# Patient Record
Sex: Male | Born: 1955 | Hispanic: Yes | Marital: Single | Smoking: Never smoker
Health system: Southern US, Community
[De-identification: ages and names within clinical notes are randomized; demographics above are authoritative.]

## PROBLEM LIST (undated history)

## (undated) DIAGNOSIS — G4733 Obstructive sleep apnea (adult) (pediatric): Secondary | ICD-10-CM

## (undated) DIAGNOSIS — I2699 Other pulmonary embolism without acute cor pulmonale: Secondary | ICD-10-CM

## (undated) DIAGNOSIS — I5042 Chronic combined systolic (congestive) and diastolic (congestive) heart failure: Secondary | ICD-10-CM

## (undated) DIAGNOSIS — Z87448 Personal history of other diseases of urinary system: Secondary | ICD-10-CM

## (undated) DIAGNOSIS — I10 Essential (primary) hypertension: Secondary | ICD-10-CM

## (undated) DIAGNOSIS — I428 Other cardiomyopathies: Secondary | ICD-10-CM

## (undated) HISTORY — DX: Obstructive sleep apnea (adult) (pediatric): G47.33

## (undated) HISTORY — DX: Chronic combined systolic (congestive) and diastolic (congestive) heart failure: I50.42

## (undated) HISTORY — DX: Other pulmonary embolism without acute cor pulmonale: I26.99

## (undated) HISTORY — DX: Personal history of other diseases of urinary system: Z87.448

---

## 2016-06-10 ENCOUNTER — Encounter (HOSPITAL_COMMUNITY): Payer: Self-pay | Admitting: Emergency Medicine

## 2016-06-10 ENCOUNTER — Emergency Department (HOSPITAL_COMMUNITY): Payer: Self-pay

## 2016-06-10 ENCOUNTER — Encounter (HOSPITAL_COMMUNITY)
Admission: EM | Disposition: A | Discharge status: Home Health | Payer: Self-pay | Source: Home / Self Care | Attending: Internal Medicine

## 2016-06-10 ENCOUNTER — Inpatient Hospital Stay (HOSPITAL_COMMUNITY)
Admission: EM | Admit: 2016-06-10 | Discharge: 2016-06-22 | DRG: 602 | Disposition: A | Discharge status: Home Health | Payer: Self-pay | Attending: Internal Medicine | Admitting: Internal Medicine

## 2016-06-10 ENCOUNTER — Inpatient Hospital Stay (HOSPITAL_COMMUNITY): Payer: Self-pay | Admitting: Certified Registered"

## 2016-06-10 ENCOUNTER — Inpatient Hospital Stay (HOSPITAL_COMMUNITY): Payer: MEDICAID | Admitting: Certified Registered"

## 2016-06-10 DIAGNOSIS — I2699 Other pulmonary embolism without acute cor pulmonale: Secondary | ICD-10-CM

## 2016-06-10 DIAGNOSIS — E876 Hypokalemia: Secondary | ICD-10-CM | POA: Diagnosis present

## 2016-06-10 DIAGNOSIS — I2609 Other pulmonary embolism with acute cor pulmonale: Secondary | ICD-10-CM | POA: Diagnosis not present

## 2016-06-10 DIAGNOSIS — B9620 Unspecified Escherichia coli [E. coli] as the cause of diseases classified elsewhere: Secondary | ICD-10-CM | POA: Diagnosis present

## 2016-06-10 DIAGNOSIS — E662 Morbid (severe) obesity with alveolar hypoventilation: Secondary | ICD-10-CM | POA: Insufficient documentation

## 2016-06-10 DIAGNOSIS — K219 Gastro-esophageal reflux disease without esophagitis: Secondary | ICD-10-CM | POA: Diagnosis present

## 2016-06-10 DIAGNOSIS — I429 Cardiomyopathy, unspecified: Secondary | ICD-10-CM | POA: Diagnosis present

## 2016-06-10 DIAGNOSIS — R159 Full incontinence of feces: Secondary | ICD-10-CM | POA: Diagnosis present

## 2016-06-10 DIAGNOSIS — K921 Melena: Secondary | ICD-10-CM | POA: Diagnosis present

## 2016-06-10 DIAGNOSIS — Z79899 Other long term (current) drug therapy: Secondary | ICD-10-CM

## 2016-06-10 DIAGNOSIS — N289 Disorder of kidney and ureter, unspecified: Secondary | ICD-10-CM

## 2016-06-10 DIAGNOSIS — N179 Acute kidney failure, unspecified: Secondary | ICD-10-CM | POA: Diagnosis present

## 2016-06-10 DIAGNOSIS — I5042 Chronic combined systolic (congestive) and diastolic (congestive) heart failure: Secondary | ICD-10-CM | POA: Insufficient documentation

## 2016-06-10 DIAGNOSIS — R7303 Prediabetes: Secondary | ICD-10-CM | POA: Diagnosis present

## 2016-06-10 DIAGNOSIS — I959 Hypotension, unspecified: Secondary | ICD-10-CM | POA: Diagnosis present

## 2016-06-10 DIAGNOSIS — I5043 Acute on chronic combined systolic (congestive) and diastolic (congestive) heart failure: Secondary | ICD-10-CM | POA: Diagnosis present

## 2016-06-10 DIAGNOSIS — I110 Hypertensive heart disease with heart failure: Secondary | ICD-10-CM | POA: Diagnosis present

## 2016-06-10 DIAGNOSIS — L0231 Cutaneous abscess of buttock: Principal | ICD-10-CM | POA: Diagnosis present

## 2016-06-10 DIAGNOSIS — J9601 Acute respiratory failure with hypoxia: Secondary | ICD-10-CM | POA: Diagnosis not present

## 2016-06-10 DIAGNOSIS — I5020 Unspecified systolic (congestive) heart failure: Secondary | ICD-10-CM | POA: Diagnosis present

## 2016-06-10 DIAGNOSIS — E860 Dehydration: Secondary | ICD-10-CM | POA: Diagnosis present

## 2016-06-10 DIAGNOSIS — L0291 Cutaneous abscess, unspecified: Secondary | ICD-10-CM

## 2016-06-10 DIAGNOSIS — R0902 Hypoxemia: Secondary | ICD-10-CM

## 2016-06-10 DIAGNOSIS — D649 Anemia, unspecified: Secondary | ICD-10-CM | POA: Diagnosis present

## 2016-06-10 DIAGNOSIS — E872 Acidosis: Secondary | ICD-10-CM | POA: Diagnosis present

## 2016-06-10 DIAGNOSIS — I428 Other cardiomyopathies: Secondary | ICD-10-CM

## 2016-06-10 DIAGNOSIS — I4729 Other ventricular tachycardia: Secondary | ICD-10-CM | POA: Diagnosis present

## 2016-06-10 DIAGNOSIS — I472 Ventricular tachycardia: Secondary | ICD-10-CM | POA: Diagnosis not present

## 2016-06-10 DIAGNOSIS — Z6841 Body Mass Index (BMI) 40.0 and over, adult: Secondary | ICD-10-CM

## 2016-06-10 DIAGNOSIS — I10 Essential (primary) hypertension: Secondary | ICD-10-CM | POA: Diagnosis present

## 2016-06-10 DIAGNOSIS — A419 Sepsis, unspecified organism: Secondary | ICD-10-CM | POA: Diagnosis present

## 2016-06-10 DIAGNOSIS — I9589 Other hypotension: Secondary | ICD-10-CM

## 2016-06-10 HISTORY — DX: Morbid (severe) obesity due to excess calories: E66.01

## 2016-06-10 HISTORY — DX: Essential (primary) hypertension: I10

## 2016-06-10 HISTORY — DX: Other cardiomyopathies: I42.8

## 2016-06-10 HISTORY — PX: INCISION AND DRAINAGE ABSCESS: SHX5864

## 2016-06-10 LAB — I-STAT CHEM 8, ED
BUN: 21 mg/dL — ABNORMAL HIGH (ref 6–20)
CHLORIDE: 94 mmol/L — AB (ref 101–111)
Calcium, Ion: 1.04 mmol/L — ABNORMAL LOW (ref 1.13–1.30)
Creatinine, Ser: 1.4 mg/dL — ABNORMAL HIGH (ref 0.61–1.24)
GLUCOSE: 108 mg/dL — AB (ref 65–99)
HCT: 46 % (ref 39.0–52.0)
HEMOGLOBIN: 15.6 g/dL (ref 13.0–17.0)
POTASSIUM: 4.1 mmol/L (ref 3.5–5.1)
SODIUM: 132 mmol/L — AB (ref 135–145)
TCO2: 25 mmol/L (ref 0–100)

## 2016-06-10 LAB — CBC WITH DIFFERENTIAL/PLATELET
Basophils Absolute: 0 10*3/uL (ref 0.0–0.1)
Basophils Relative: 0 %
Eosinophils Absolute: 0.1 10*3/uL (ref 0.0–0.7)
Eosinophils Relative: 1 %
HCT: 40.3 % (ref 39.0–52.0)
Hemoglobin: 14 g/dL (ref 13.0–17.0)
Lymphocytes Relative: 17 %
Lymphs Abs: 2.1 10*3/uL (ref 0.7–4.0)
MCH: 31.6 pg (ref 26.0–34.0)
MCHC: 34.7 g/dL (ref 30.0–36.0)
MCV: 91 fL (ref 78.0–100.0)
Monocytes Absolute: 0.6 10*3/uL (ref 0.1–1.0)
Monocytes Relative: 5 %
Neutro Abs: 9.5 10*3/uL — ABNORMAL HIGH (ref 1.7–7.7)
Neutrophils Relative %: 77 %
Platelets: 367 10*3/uL (ref 150–400)
RBC: 4.43 MIL/uL (ref 4.22–5.81)
RDW: 14.4 % (ref 11.5–15.5)
WBC Morphology: INCREASED
WBC: 12.3 10*3/uL — ABNORMAL HIGH (ref 4.0–10.5)

## 2016-06-10 LAB — URINALYSIS, ROUTINE W REFLEX MICROSCOPIC
BILIRUBIN URINE: NEGATIVE
GLUCOSE, UA: NEGATIVE mg/dL
HGB URINE DIPSTICK: NEGATIVE
KETONES UR: NEGATIVE mg/dL
Leukocytes, UA: NEGATIVE
Nitrite: NEGATIVE
Protein, ur: NEGATIVE mg/dL
Specific Gravity, Urine: 1.029 (ref 1.005–1.030)
pH: 6 (ref 5.0–8.0)

## 2016-06-10 LAB — I-STAT CG4 LACTIC ACID, ED
Lactic Acid, Venous: 2.9 mmol/L (ref 0.5–1.9)
Lactic Acid, Venous: 1.11 mmol/L (ref 0.5–1.9)

## 2016-06-10 LAB — POC OCCULT BLOOD, ED: FECAL OCCULT BLD: POSITIVE — AB

## 2016-06-10 LAB — COMPREHENSIVE METABOLIC PANEL
ALBUMIN: 2.6 g/dL — AB (ref 3.5–5.0)
ALK PHOS: 52 U/L (ref 38–126)
ALT: 26 U/L (ref 17–63)
AST: 29 U/L (ref 15–41)
Anion gap: 12 (ref 5–15)
BILIRUBIN TOTAL: 0.5 mg/dL (ref 0.3–1.2)
BUN: 16 mg/dL (ref 6–20)
CO2: 22 mmol/L (ref 22–32)
Calcium: 8.9 mg/dL (ref 8.9–10.3)
Chloride: 96 mmol/L — ABNORMAL LOW (ref 101–111)
Creatinine, Ser: 1.48 mg/dL — ABNORMAL HIGH (ref 0.61–1.24)
GFR calc Af Amer: 58 mL/min — ABNORMAL LOW (ref >60)
GFR calc non Af Amer: 50 mL/min — ABNORMAL LOW (ref >60)
GLUCOSE: 110 mg/dL — AB (ref 65–99)
POTASSIUM: 3.9 mmol/L (ref 3.5–5.1)
Sodium: 130 mmol/L — ABNORMAL LOW (ref 135–145)
TOTAL PROTEIN: 6.5 g/dL (ref 6.5–8.1)

## 2016-06-10 LAB — CBG MONITORING, ED: GLUCOSE-CAPILLARY: 122 mg/dL — AB (ref 65–99)

## 2016-06-10 LAB — RAPID HIV SCREEN (HIV 1/2 AB+AG)
HIV 1/2 Antibodies: NONREACTIVE
HIV-1 P24 Antigen - HIV24: NONREACTIVE

## 2016-06-10 LAB — GLUCOSE, CAPILLARY: GLUCOSE-CAPILLARY: 102 mg/dL — AB (ref 65–99)

## 2016-06-10 SURGERY — INCISION AND DRAINAGE, ABSCESS
Anesthesia: General | Site: Buttocks | Laterality: Right

## 2016-06-10 MED ORDER — LACTATED RINGERS IV SOLN
INTRAVENOUS | Status: DC | PRN
  Administered 2016-06-10: 20:00:00 via INTRAVENOUS

## 2016-06-10 MED ORDER — PHENYLEPHRINE 40 MCG/ML (10ML) SYRINGE FOR IV PUSH (FOR BLOOD PRESSURE SUPPORT)
PREFILLED_SYRINGE | INTRAVENOUS | Status: AC
  Filled 2016-06-10: qty 50

## 2016-06-10 MED ORDER — SODIUM CHLORIDE 0.9 % IV SOLN
80.0000 mg | Freq: Once | INTRAVENOUS | Status: AC
  Administered 2016-06-10: 80 mg via INTRAVENOUS
  Filled 2016-06-10: qty 80

## 2016-06-10 MED ORDER — ACETAMINOPHEN 650 MG RE SUPP: 650.0000 mg | Freq: Four times a day (QID) | RECTAL | Status: DC | PRN

## 2016-06-10 MED ORDER — ONDANSETRON HCL 4 MG/2ML IJ SOLN: 4.0000 mg | Freq: Four times a day (QID) | INTRAMUSCULAR | Status: DC | PRN

## 2016-06-10 MED ORDER — EPHEDRINE 5 MG/ML INJ
INTRAVENOUS | Status: AC
  Filled 2016-06-10: qty 20

## 2016-06-10 MED ORDER — MIDAZOLAM HCL 2 MG/2ML IJ SOLN
INTRAMUSCULAR | Status: AC
  Filled 2016-06-10: qty 2

## 2016-06-10 MED ORDER — PROPOFOL 10 MG/ML IV BOLUS
INTRAVENOUS | Status: DC | PRN
  Administered 2016-06-10: 100 mg via INTRAVENOUS

## 2016-06-10 MED ORDER — HYDROMORPHONE HCL 1 MG/ML IJ SOLN: 0.5000 mg | INTRAMUSCULAR | Status: DC | PRN

## 2016-06-10 MED ORDER — IOPAMIDOL (ISOVUE-300) INJECTION 61%
INTRAVENOUS | Status: AC
  Administered 2016-06-10: 100 mL
  Filled 2016-06-10: qty 100

## 2016-06-10 MED ORDER — ONDANSETRON HCL 4 MG/2ML IJ SOLN: 4.0000 mg | Freq: Once | INTRAMUSCULAR | Status: DC | PRN

## 2016-06-10 MED ORDER — FENTANYL CITRATE (PF) 100 MCG/2ML IJ SOLN
INTRAMUSCULAR | Status: DC | PRN
  Administered 2016-06-10: 150 ug via INTRAVENOUS
  Administered 2016-06-10 (×3): 50 ug via INTRAVENOUS

## 2016-06-10 MED ORDER — PIPERACILLIN-TAZOBACTAM 3.375 G IVPB
3.3750 g | Freq: Three times a day (TID) | INTRAVENOUS | Status: DC
  Administered 2016-06-11 – 2016-06-14 (×11): 3.375 g via INTRAVENOUS
  Filled 2016-06-10 (×14): qty 50

## 2016-06-10 MED ORDER — ACETAMINOPHEN 325 MG PO TABS: 650.0000 mg | ORAL_TABLET | Freq: Four times a day (QID) | ORAL | Status: DC | PRN

## 2016-06-10 MED ORDER — SODIUM CHLORIDE 0.9 % IV SOLN
INTRAVENOUS | Status: DC
  Administered 2016-06-11 – 2016-06-12 (×2): via INTRAVENOUS

## 2016-06-10 MED ORDER — 0.9 % SODIUM CHLORIDE (POUR BTL) OPTIME
TOPICAL | Status: DC | PRN
  Administered 2016-06-10 (×2): 1000 mL

## 2016-06-10 MED ORDER — FENTANYL CITRATE (PF) 250 MCG/5ML IJ SOLN
INTRAMUSCULAR | Status: AC
  Filled 2016-06-10: qty 5

## 2016-06-10 MED ORDER — PIPERACILLIN-TAZOBACTAM 3.375 G IVPB 30 MIN
3.3750 g | Freq: Once | INTRAVENOUS | Status: AC
  Administered 2016-06-10: 3.375 g via INTRAVENOUS
  Filled 2016-06-10: qty 50

## 2016-06-10 MED ORDER — VANCOMYCIN HCL IN DEXTROSE 1-5 GM/200ML-% IV SOLN
1000.0000 mg | Freq: Once | INTRAVENOUS | Status: AC
  Administered 2016-06-10: 1000 mg via INTRAVENOUS
  Filled 2016-06-10: qty 200

## 2016-06-10 MED ORDER — SODIUM CHLORIDE 0.9 % IV BOLUS (SEPSIS)
1000.0000 mL | Freq: Once | INTRAVENOUS | Status: AC
  Administered 2016-06-10: 1000 mL via INTRAVENOUS

## 2016-06-10 MED ORDER — SUCCINYLCHOLINE CHLORIDE 20 MG/ML IJ SOLN
INTRAMUSCULAR | Status: DC | PRN
  Administered 2016-06-10: 140 mg via INTRAVENOUS

## 2016-06-10 MED ORDER — LIDOCAINE HCL (CARDIAC) 20 MG/ML IV SOLN
INTRAVENOUS | Status: DC | PRN
  Administered 2016-06-10: 100 mg via INTRAVENOUS

## 2016-06-10 MED ORDER — PHENYLEPHRINE HCL 10 MG/ML IJ SOLN
INTRAMUSCULAR | Status: DC | PRN
  Administered 2016-06-10 (×7): 120 ug via INTRAVENOUS

## 2016-06-10 MED ORDER — VANCOMYCIN HCL IN DEXTROSE 750-5 MG/150ML-% IV SOLN
750.0000 mg | Freq: Two times a day (BID) | INTRAVENOUS | Status: DC
  Filled 2016-06-10 (×2): qty 150

## 2016-06-10 MED ORDER — SODIUM CHLORIDE 0.9 % IV SOLN
8.0000 mg/h | INTRAVENOUS | Status: DC
  Administered 2016-06-11: 8 mg/h via INTRAVENOUS
  Filled 2016-06-10 (×4): qty 80

## 2016-06-10 MED ORDER — LIDOCAINE 2% (20 MG/ML) 5 ML SYRINGE
INTRAMUSCULAR | Status: AC
  Filled 2016-06-10: qty 5

## 2016-06-10 MED ORDER — FENTANYL CITRATE (PF) 100 MCG/2ML IJ SOLN
50.0000 ug | Freq: Once | INTRAMUSCULAR | Status: AC
  Administered 2016-06-10: 50 ug via INTRAVENOUS
  Filled 2016-06-10: qty 2

## 2016-06-10 MED ORDER — ONDANSETRON HCL 4 MG PO TABS: 4.0000 mg | ORAL_TABLET | Freq: Four times a day (QID) | ORAL | Status: DC | PRN

## 2016-06-10 MED ORDER — PROPOFOL 10 MG/ML IV BOLUS
INTRAVENOUS | Status: AC
  Filled 2016-06-10: qty 20

## 2016-06-10 SURGICAL SUPPLY — 33 items
BLADE SURG ROTATE 9660 (MISCELLANEOUS) IMPLANT
BNDG GAUZE ELAST 4 BULKY (GAUZE/BANDAGES/DRESSINGS) ×3 IMPLANT
CANISTER SUCTION 2500CC (MISCELLANEOUS) ×3 IMPLANT
CONT SPEC 4OZ CLIKSEAL STRL BL (MISCELLANEOUS) ×3 IMPLANT
COVER SURGICAL LIGHT HANDLE (MISCELLANEOUS) ×3 IMPLANT
DRAIN PENROSE 1X12 LTX STRL (DRAIN) ×6 IMPLANT
DRAPE LAPAROSCOPIC ABDOMINAL (DRAPES) IMPLANT
DRAPE LAPAROTOMY 100X72 PEDS (DRAPES) ×3 IMPLANT
DRAPE LAPAROTOMY T 98X78 PEDS (DRAPES) IMPLANT
DRSG PAD ABDOMINAL 8X10 ST (GAUZE/BANDAGES/DRESSINGS) ×6 IMPLANT
ELECT CAUTERY BLADE 6.4 (BLADE) ×3 IMPLANT
ELECT REM PT RETURN 9FT ADLT (ELECTROSURGICAL) ×3
ELECTRODE REM PT RTRN 9FT ADLT (ELECTROSURGICAL) ×1 IMPLANT
GAUZE SPONGE 4X4 12PLY STRL (GAUZE/BANDAGES/DRESSINGS) IMPLANT
GLOVE BIO SURGEON STRL SZ7 (GLOVE) ×3 IMPLANT
GLOVE BIOGEL PI IND STRL 7.5 (GLOVE) ×1 IMPLANT
GLOVE BIOGEL PI INDICATOR 7.5 (GLOVE) ×2
GOWN STRL REUS W/ TWL LRG LVL3 (GOWN DISPOSABLE) ×2 IMPLANT
GOWN STRL REUS W/TWL LRG LVL3 (GOWN DISPOSABLE) ×4
KIT BASIN OR (CUSTOM PROCEDURE TRAY) ×3 IMPLANT
KIT ROOM TURNOVER OR (KITS) ×3 IMPLANT
NS IRRIG 1000ML POUR BTL (IV SOLUTION) ×6 IMPLANT
PACK SURGICAL SETUP 50X90 (CUSTOM PROCEDURE TRAY) ×3 IMPLANT
PAD ARMBOARD 7.5X6 YLW CONV (MISCELLANEOUS) ×3 IMPLANT
PENCIL BUTTON HOLSTER BLD 10FT (ELECTRODE) ×3 IMPLANT
SUT ETHILON 2 0 FS 18 (SUTURE) ×6 IMPLANT
SWAB COLLECTION DEVICE MRSA (MISCELLANEOUS) IMPLANT
TOWEL OR 17X24 6PK STRL BLUE (TOWEL DISPOSABLE) ×3 IMPLANT
TOWEL OR 17X26 10 PK STRL BLUE (TOWEL DISPOSABLE) ×3 IMPLANT
TUBE ANAEROBIC SPECIMEN COL (MISCELLANEOUS) ×3 IMPLANT
TUBE CONNECTING 12'X1/4 (SUCTIONS) ×1
TUBE CONNECTING 12X1/4 (SUCTIONS) ×2 IMPLANT
YANKAUER SUCT BULB TIP NO VENT (SUCTIONS) ×3 IMPLANT

## 2016-06-10 NOTE — Anesthesia Procedure Notes (Signed)
Procedure Name: Intubation Date/Time: 06/10/2016 8:30 PM Performed by: Rosiland Oz Pre-anesthesia Checklist: Patient identified, Emergency Drugs available, Suction available, Patient being monitored and Timeout performed Patient Re-evaluated:Patient Re-evaluated prior to inductionOxygen Delivery Method: Circle system utilized Preoxygenation: Pre-oxygenation with 100% oxygen Intubation Type: IV induction, Cricoid Pressure applied and Rapid sequence Laryngoscope Size: Miller and 2 Grade View: Grade I Tube type: Oral Tube size: 7.5 mm Number of attempts: 1 Airway Equipment and Method: Stylet Placement Confirmation: ETT inserted through vocal cords under direct vision,  breath sounds checked- equal and bilateral and positive ETCO2 Secured at: 21 cm Tube secured with: Tape Dental Injury: Teeth and Oropharynx as per pre-operative assessment

## 2016-06-10 NOTE — Op Note (Signed)
Preop diagnosis: Large right gluteal abscess Postop diagnosis: Same Procedure performed: Incision and drainage of large right gluteal abscess Surgeon:Ioane Bhola K. Anesthesia: Gen. endotracheal Indications: This is a 59 year old male who presents with bout 12 days of generalized malaise and 5 days of worsening pain in his right buttock. He has developed a large abscess in this area that has begun to drain some purulent fluid spontaneously. However the patient is septic with hypotension and tachycardia. He had some mental status changes. He presented to the emergency department for evaluation. A CT scan showed a large gluteal abscess at tracks up adjacent to the rectum to the level of the prostate. We're asked to see the patient for emergent treatment. He is being admitted by the hospitalist.  Description of procedure: Patient brought to the operating room and placed in the supine position on the stretcher. After an adequate level general anesthesia was obtained, he was moved a prone position on the operating room table. His buttocks were prepped with Betadine and draped sterile fashion. The patient has purulent fluid draining from a 3 cm opening in his right buttock. Just superior to this area there is a 1 cm opening. A timeout was taken to ensure the proper patient and proper procedure. Culture swabs were used to swab the purulence coming out of his abscess. We connected the 2 openings to create a larger opening. Blunt dissection was used to dissect into a large abscess cavity. This tracks inferiorly towards the upper thigh. Also tracks superiorly adjacent to the rectum for distance of about 15 cm. We irrigated the wound thoroughly with 2 L of warm saline. A 1 inch Penrose drain was placed deep into the superior abscess cavity. Another Penrose drain was placed into the lower abscess cavity. These were secured with 2-0 nylon sutures. Saline moistened Kerlix was used to pack the superficial portion of the  wound. Dry dressing was applied. The patient was then moved back to supine position. He was extubated and brought to recovery in stable condition. He is being admitted by [facility].  Wilmon Arms. Corliss Skains, MD, [facility] Surgery  General/ Trauma Surgery  06/10/2016 9:12 PM

## 2016-06-10 NOTE — ED Provider Notes (Signed)
CSN: [id number]     Arrival date & time 06/10/16  1525 History   First MD Initiated Contact with Patient 06/10/16 1559     Chief Complaint  Patient presents with  . Wound Infection     (Consider location/radiation/quality/duration/timing/severity/associated sxs/prior Treatment) A language interpreter was used.     Blood pressure 82/59, pulse 39, temperature 99.8 F (37.7 C), temperature source Oral, resp. rate 23, weight 117.935 kg, SpO2 88 %.  Justin Sampson is a 60 y.o. male complaining of 2 wounds to rectum, extremely painful onset approximately 5 days ago. Patient denies fever, chills, nausea, vomiting, pain with bowel movements. He had some tonic-clonic movement with syncope and was incontinent to stool and urine in the waiting room. As per his friend who accompanies him he had a shorter similar episode on the way into the emergency department. Patient denies chest pain, abdominal pain, nausea, vomiting, headache, similar prior episodes, heavy alcohol drinking, drug use, allergies to medications   Past Medical History  Diagnosis Date  . Hypertension    History reviewed. No pertinent past surgical history. No family history on file. Social History  Substance Use Topics  . Smoking status: Never Smoker   . Smokeless tobacco: None  . Alcohol Use: No    Review of Systems  10 systems reviewed and found to be negative, except as noted in the HPI.  Allergies  Review of patient's allergies indicates no known allergies.  Home Medications   Prior to Admission medications   Medication Sig Start Date End Date Taking? Authorizing Provider  naproxen (NAPROSYN) 500 MG tablet Take 500 mg by mouth daily as needed. 10/20/15  Yes Historical Provider, MD  losartan-hydrochlorothiazide (HYZAAR) 100-12.5 MG tablet Take 1 tablet by mouth daily.    Historical Provider, MD   BP 107/59 mmHg  Pulse 78  Temp(Src) 99.8 F (37.7 C) (Oral)  Resp 27  Wt 117.935 kg  SpO2 100% Physical Exam   Constitutional: He appears well-developed and well-nourished. No distress.  Obese, moving all extremities, incontinent to dark stool and appears to be wet from urine as well.  HENT:  Head: Normocephalic and atraumatic.  Dry MM  Eyes: Conjunctivae and EOM are normal. Pupils are equal, round, and reactive to light.  Neck: Normal range of motion.  FROM to C-spine. Pt can touch chin to chest without discomfort. No TTP of midline cervical spine.   Cardiovascular: Normal rate, regular rhythm and intact distal pulses.   Pulmonary/Chest: Effort normal and breath sounds normal. No stridor. No respiratory distress. He has no wheezes. He has no rales. He exhibits no tenderness.  Abdominal: Soft. Bowel sounds are normal. He exhibits no distension and no mass. There is no tenderness. There is no rebound and no guarding.  Genitourinary:  Incontinent to melanotic stool  Musculoskeletal: Normal range of motion.  Neurological: He is alert.  Oriented to person, time when asked what city he is in he states [facility].   Moving all extremities, goal oriented street speech, no facial droop.  Skin: Rash noted.  Psychiatric: He has a normal mood and affect.  Nursing note and vitals reviewed.       ED Course  Procedures (including critical care time)  CRITICAL CARE Performed by: Wynetta Emery   Total critical care time: 45 minutes  Critical care time was exclusive of separately billable procedures and treating other patients.  Critical care was necessary to treat or prevent imminent or life-threatening deterioration.  Critical care was time spent personally  by me on the following activities: development of treatment plan with patient and/or surrogate as well as nursing, discussions with consultants, evaluation of patient's response to treatment, examination of patient, obtaining history from patient or surrogate, ordering and performing treatments and interventions, ordering and  review of laboratory studies, ordering and review of radiographic studies, pulse oximetry and re-evaluation of patient's condition.    Labs Review Labs Reviewed  COMPREHENSIVE METABOLIC PANEL - Abnormal; Notable for the following:    Sodium 130 (*)    Chloride 96 (*)    Glucose, Bld 110 (*)    Creatinine, Ser 1.48 (*)    Albumin 2.6 (*)    GFR calc non Af Amer 50 (*)    GFR calc Af Amer 58 (*)    All other components within normal limits  CBC WITH DIFFERENTIAL/PLATELET - Abnormal; Notable for the following:    WBC 12.3 (*)    Neutro Abs 9.5 (*)    All other components within normal limits  I-STAT CG4 LACTIC ACID, ED - Abnormal; Notable for the following:    Lactic Acid, Venous 2.90 (*)    All other components within normal limits  CBG MONITORING, ED - Abnormal; Notable for the following:    Glucose-Capillary 122 (*)    All other components within normal limits  POC OCCULT BLOOD, ED - Abnormal; Notable for the following:    Fecal Occult Bld POSITIVE (*)    All other components within normal limits  I-STAT CHEM 8, ED - Abnormal; Notable for the following:    Sodium 132 (*)    Chloride 94 (*)    BUN 21 (*)    Creatinine, Ser 1.40 (*)    Glucose, Bld 108 (*)    Calcium, Ion 1.04 (*)    All other components within normal limits  URINE CULTURE  CULTURE, BLOOD (ROUTINE X 2)  CULTURE, BLOOD (ROUTINE X 2)  AEROBIC CULTURE (SUPERFICIAL SPECIMEN)  URINALYSIS, ROUTINE W REFLEX MICROSCOPIC (NOT AT [facility])  RAPID HIV SCREEN (HIV 1/2 AB+AG)  RPR  I-STAT CG4 LACTIC ACID, ED  I-STAT CG4 LACTIC ACID, ED    Imaging Review Ct Abdomen Pelvis W Contrast  06/10/2016  CLINICAL DATA:  Perirectal pain with focal wound EXAM: CT ABDOMEN AND PELVIS WITH CONTRAST TECHNIQUE: Multidetector CT imaging of the abdomen and pelvis was performed using the standard protocol following bolus administration of intravenous contrast. CONTRAST:  100 mL ISOVUE-300 IOPAMIDOL (ISOVUE-300) INJECTION 61% COMPARISON:   None. FINDINGS: Lower chest:  No acute findings. Hepatobiliary: The liver is mildly fatty infiltrated. The gallbladder is within normal limits. Pancreas: No mass, inflammatory changes, or other significant abnormality. Spleen: Within normal limits in size and appearance. Adrenals/Urinary Tract: No masses identified. No evidence of hydronephrosis. Small right renal cyst is seen. The bladder is well distended. Stomach/Bowel: No evidence of obstruction, inflammatory process, or abnormal fluid collections. The appendix is within normal limits. Mild diverticular change is noted without evidence of diverticulitis. Vascular/Lymphatic: No pathologically enlarged lymph nodes. No evidence of abdominal aortic aneurysm. Reproductive: No mass or other significant abnormality. Other: Fat containing inguinal hernia is seen. In the right gluteal region medially of there is some inflammatory change and a small amount of subcutaneous air likely representing inflammation. Additionally more superiorly adjacent to the prostate and rectum there are areas of air and fluid identified. The largest of these lies just lateral to the prostate and measures 3.4 x 2.4 x 14.7 cm in greatest AP, transverse and craniocaudad projections respectively. This is  best visualized on image number 99 of series 2 as well as images 111 of series 5 and 97 of series 6. These are consistent with small abscesses Musculoskeletal:  No suspicious bone lesions identified. IMPRESSION: Air-fluid collections adjacent to the prostate laterally on the right consistent with abscesses. Smaller collection is noted somewhat more posteriorly adjacent to the distal rectum. Additionally some inflammatory changes in the right gluteal region medially are seen. Electronically Signed   By: Alcide Clever M.D.   On: 06/10/2016 18:21   Dg Chest Port 1 View  06/10/2016  CLINICAL DATA:  Possible sepsis EXAM: PORTABLE CHEST 1 VIEW COMPARISON:  None. FINDINGS: The heart size is enlarged.  There is no focal infiltrate, pulmonary edema, or pleural effusion. The lung volumes are low. Mediastinal contour is unremarkable given the AP semi-erect technique. The visualized skeletal structures are unremarkable. IMPRESSION: Cardiomegaly.  No focal pneumonia. Electronically Signed   By: Sherian Rein M.D.   On: 06/10/2016 16:47   I have personally reviewed and evaluated these images and lab results as part of my medical decision-making.   EKG Interpretation   Date/Time:  Tuesday June 10 2016 16:08:36 EDT Ventricular Rate:  95 PR Interval:    QRS Duration: 94 QT Interval:  348 QTC Calculation: 438 R Axis:   41 Text Interpretation:  Sinus rhythm Ventricular premature complex  Nonspecific T abnormalities, lateral leads No old tracing to compare  Confirmed by [facility] MD, JASON [phone]) on 06/10/2016 6:34:43 PM       MDM   Final diagnoses:  Sepsis affecting skin  Melanotic stools   Filed Vitals:   06/10/16 1702 06/10/16 1715 06/10/16 1809 06/10/16 1815  BP:  102/54 95/41 107/59  Pulse: 95 77  78  Temp:      TempSrc:      Resp: 19 18 19 27   Weight:      SpO2: 100% 96%  100%    Medications  sodium chloride 0.9 % bolus 1,000 mL (1,000 mLs Intravenous New Bag/Given 06/10/16 1813)    And  sodium chloride 0.9 % bolus 1,000 mL (1,000 mLs Intravenous New Bag/Given 06/10/16 1629)    And  sodium chloride 0.9 % bolus 1,000 mL (0 mLs Intravenous Stopped 06/10/16 1814)    And  sodium chloride 0.9 % bolus 1,000 mL (0 mLs Intravenous Stopped 06/10/16 1814)  vancomycin (VANCOCIN) IVPB 1000 mg/200 mL premix (1,000 mg Intravenous New Bag/Given 06/10/16 1839)  piperacillin-tazobactam (ZOSYN) IVPB 3.375 g (not administered)  vancomycin (VANCOCIN) IVPB 750 mg/150 ml premix (not administered)  pantoprazole (PROTONIX) 80 mg in sodium chloride 0.9 % 100 mL IVPB (not administered)  pantoprazole (PROTONIX) 80 mg in sodium chloride 0.9 % 250 mL (0.32 mg/mL) infusion (not administered)  fentaNYL  (SUBLIMAZE) injection 50 mcg (not administered)  piperacillin-tazobactam (ZOSYN) IVPB 3.375 g (0 g Intravenous Stopped 06/10/16 1814)  vancomycin (VANCOCIN) IVPB 1000 mg/200 mL premix (0 mg Intravenous Stopped 06/10/16 1839)  iopamidol (ISOVUE-300) 61 % injection (100 mLs  Contrast Given 06/10/16 1748)    Justin Sampson is 60 y.o. male presenting with Possible seizure-like activity in the waiting room. Patient had some lethargy and possibly tonic-clonic movement was incontinent to urine and stool. I my assessment he was not really postictal but he was confused about what city he was in. Neurologic exam is otherwise nonfocal. He states he's had some wounds in the perirectal area for approximately one week, on my exam he has a large amount of melanotic stool actively draining from rectum.  Patient states he doesn't take excessive amount of NSAIDs or drink alcohol. Abdominal exam is benign. No significant anemia, patient will be started on Protonix bolus and drip. Purulent openly draining abscesses with cellulitis to the right gluteus, he is initially hypotensive with systolic in the 80s. Mildly tachycardic. Sepsis protocol initiated and started on Vanco and Zosyn.  Blood pressure has improved after initial fluid bolus with a systolic of 107, patient is given fentanyl for pain control. CT with right gluteal cellulitis and to abscesses.  This is a shared visit with the attending physician who personally evaluated the patient and agrees with the care plan.   General surgery consult from Dr. Corliss Skains  we appreciated: Given the deep nature of the abscesses around the prostate he recommends that we discussed this with urology.  Urology consult from Dr. Vernie Ammons appreciated: States that he is never seen a prostatitis that doesn't form an abscess within the prostate however he states this is possible, he would like to evaluate the CAT scan, will call me back.  Dr. Vernie Ammons states that with a completely clean urine he  doubts the prostate is the source of the infection.   Unassigned admission to [facility] Dr. Arlean Hopping.   Dr. Corliss Skains at bedside, will take to OR.   Wynetta Emery, PA-C 06/10/16 1935  Tsuei concerned that this Pt will be difficult to off the vent postop and request that we discussed this with critical care.  D/w Dr. Molli Knock who will be happy to accept this patient if he is unable to be weaned off the vent, discussed with surgeon and [facility] hospitalist.   Wynetta Emery, PA-C 06/10/16 2013  Marily Memos, MD 06/13/16 [id number]

## 2016-06-10 NOTE — H&P (Signed)
[facility] History and Physical  Justin Sampson ZOX:[id number] DOB: January 03, 1956 DOA: 06/10/2016  Referring physician: Dr. Clayborne Dana, ED PCP: No primary care provider on file.   Chief Complaint: Gluteal abscess  HPI: Justin Sampson is a 60 y.o. male with hx of HTN , no other PMH, presented to ED with "two sores on his bottom", very painful and weren't healing per ED notes.  In ED pt was hypotensive and had an episode of shaking which ED MD felt was more hypoperfusion than seizure. ED MD also said that during her rectal exam he had black melenic stool. THey started PPI drip.  CT pelvis showed gluteal abscess tracking towards the prostate with soft-tissue gas.  REc'd IVF"s with improved BP in ED from 80's > 90's.  Taken to OR by DR Tsuei with drainage of a large abscess. Post op is in PACU, alert and conversant.  Last BP 90/68.    Patient denies sig ETOH or nsaids.  No smoking, occ etoh.  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  Has wife and 3 kids in [place], one child is here.    Denies any chest pain, n/v , abd pain at this time postop.       Past Medical History  Past Medical History  Diagnosis Date  . Hypertension    Past Surgical History History reviewed. No pertinent past surgical history. Family History No family history on file. Social History  reports that he has never smoked. He does not have any smokeless tobacco history on file. He reports that he does not drink alcohol. His drug history is not on file. Allergies No Known Allergies Home medications Prior to Admission medications   Medication Sig Start Date End Date Taking? Authorizing Provider  naproxen (NAPROSYN) 500 MG tablet Take 500 mg by mouth daily as needed. 10/20/15  Yes Historical Provider, MD  losartan-hydrochlorothiazide (HYZAAR) 100-12.5 MG tablet Take 1 tablet by mouth daily.    Historical Provider, MD   Liver Function Tests  Recent Labs Lab 06/10/16 1607  AST 29  ALT 26  ALKPHOS 52  BILITOT 0.5  PROT 6.5   ALBUMIN 2.6*   No results for input(s): LIPASE, AMYLASE in the last 168 hours. CBC  Recent Labs Lab 06/10/16 1607 06/10/16 1639  WBC 12.3*  --   NEUTROABS 9.5*  --   HGB 14.0 15.6  HCT 40.3 46.0  MCV 91.0  --   PLT 367  --    Basic Metabolic Panel  Recent Labs Lab 06/10/16 1607 06/10/16 1639  NA 130* 132*  K 3.9 4.1  CL 96* 94*  CO2 22  --   GLUCOSE 110* 108*  BUN 16 21*  CREATININE 1.48* 1.40*  CALCIUM 8.9  --      Filed Vitals:   06/10/16 1809 06/10/16 1815 06/10/16 1830 06/10/16 1900  BP: 95/41 107/59 107/63 98/52  Pulse:  78  90  Temp:      TempSrc:      Resp: Weight:      SpO2:  100% 90% 89%   Exam: Gen obese middle aged hispanic male, alert and conversant postop Some chronic irritant rash under the eyes bilat R foot fungal looking intertrig rash Sclera anicteric, throat clear  No jvd or bruits Chest clear bilat to bases RRR no MRG Abd soft obese ntnd no mass no hsm +BS GU perineal area dressed, not removed MS no joint effusions or deformity Ext no LE or UE edema / no  LE wounds or ulcers Neuro is alert, Ox 3 , nf   Na 132  K 4.1  Cr 1.4  BUN 21  Glu 108  Alb 2.6  Hb 15  WBC 12 UA neg  EKG (independ reviewed) > NSR 95 bpm, no ischemic changes, PVC  CXR (independ reviewed) > CM, poor inspiration, no edema, L hemidiaphragm obscured   Assessment: 1.  Gluteal abscess - large, sp I&D tonight. Admit SDU in ICU in case decompensates 2.  Hypotension - he rec'd 4 L of IVF's in ED and 1 L in OR.  Watch BP's.   3.  Melena - black stool according to ED MD, guiac +. Hb 15, not sure if this is GIB or not. On empiric IV PPI for now, follow clinically 4.  Hx HTN - holding htn meds 5.  Renal insuff - watch   Plan - admit , IV vanc/ zosyn, IVF"s , foley cath, f/u H/H in am     Maree Krabbe [facility] Pager [phone]  Cell [phone]  If 7PM-7AM, please contact night-coverage [email or web address] Password [facility] 06/10/2016,  7:18 PM

## 2016-06-10 NOTE — Anesthesia Preprocedure Evaluation (Signed)
Anesthesia Evaluation  Patient identified by MRN, date of birth, ID band Patient awake    Reviewed: Allergy & Precautions, NPO status , Patient's Chart, lab work & pertinent test results  Airway Mallampati: I  TM Distance: >3 FB     Dental   Pulmonary    Pulmonary exam normal        Cardiovascular hypertension, Normal cardiovascular exam     Neuro/Psych    GI/Hepatic   Endo/Other    Renal/GU      Musculoskeletal   Abdominal   Peds  Hematology   Anesthesia Other Findings ? sepsis  Reproductive/Obstetrics                             Anesthesia Physical Anesthesia Plan  ASA: II and emergent  Anesthesia Plan: General   Post-op Pain Management:    Induction: Intravenous  Airway Management Planned: Oral ETT  Additional Equipment:   Intra-op Plan:   Post-operative Plan: Extubation in OR and Possible Post-op intubation/ventilation  Informed Consent: I have reviewed the patients History and Physical, chart, labs and discussed the procedure including the risks, benefits and alternatives for the proposed anesthesia with the patient or authorized representative who has indicated his/her understanding and acceptance.     Plan Discussed with: CRNA, Anesthesiologist and Surgeon  Anesthesia Plan Comments:         Anesthesia Quick Evaluation

## 2016-06-10 NOTE — Progress Notes (Signed)
Pharmacy Antibiotic Note Justin Sampson is a 60 y.o. male admitted on 06/10/2016 with wounds around rectum area.  Pharmacy has been consulted for Zosyn and vancomycin dosing.  Plan: 1. Zosyn 3.375g IV q8h (4 hour infusion).  2. Vancomycin 2000 mg x 1 now followed by vancomycin 750 mg Q 12 hours starting on 7/12 am. 3. If vancomycin continued will obtain trough at [facility]; goal 15-20.    Weight: 260 lb (117.935 kg)  Temp (24hrs), Avg:98.8 F (37.1 C), Min:97.7 F (36.5 C), Max:99.8 F (37.7 C)   Recent Labs Lab 06/10/16 1607 06/10/16 1630 06/10/16 1639  WBC 12.3*  --   --   CREATININE 1.48*  --  1.40*  LATICACIDVEN  --  2.90*  --     CrCl cannot be calculated (Unknown ideal weight.).    No Known Allergies  Antimicrobials this admission: 7/11 Zosyn >>  7/11 Vancomycin  >>   Dose adjustments this admission: n/a  Microbiology results: 7/11 BCx: px   Thank you for allowing pharmacy to be a part of this patient's care.  Pollyann Samples, PharmD, BCPS 06/10/2016, 5:23 PM Pager: [phone]

## 2016-06-10 NOTE — ED Notes (Signed)
Pt states he has been having pain in bottom and states he has two sore there that are very painful and wont seem to heal. interpretor phone used.

## 2016-06-10 NOTE — Consult Note (Signed)
Name: Justin Sampson MRN: [id number] DOB: [birth year]    ADMISSION DATE:  06/10/2016 CONSULTATION DATE:  06/10/16  REFERRING MD :  Arlean Hopping  CHIEF COMPLAINT:  Gluteal Abscess   HISTORY OF PRESENT ILLNESS:  Justin Sampson is a 60 y.o. male with a PMH of HTN.  He presented to [facility] ED 07/11 with "two sores on his bottom" that have been veery painful and have not healed.  They had began to drain which prompted him to seek medical attention.  He has apparently had roughly 12 days of general malaise and decreased appetite, and over past 5 days, pain in buttock has worsened.  In ED, CT of the abd / pelvis showed gluteal abscess tracking towards prostate with soft tissue gas.  He was taken to the OR for I&D by Dr. Corliss Skains.  Post op, he initially had some abnormal breathing with mildly labored respirations; therefore, PCCM was asked to see.  On our evaluation, pt's breathing had returned to normal and he denied any difficulties with respirations, chest pain, discomfort.  Per RN in PACU, pt appears much better compared to when first arrived in PACU.  BP's stable with SBP around 108.  PAST MEDICAL HISTORY :   has a past medical history of Hypertension.  has no past surgical history on file. Prior to Admission medications   Medication Sig Start Date End Date Taking? Authorizing Provider  naproxen (NAPROSYN) 500 MG tablet Take 500 mg by mouth daily as needed. 10/20/15  Yes Historical Provider, MD  losartan-hydrochlorothiazide (HYZAAR) 100-12.5 MG tablet Take 1 tablet by mouth daily.    Historical Provider, MD   No Known Allergies  FAMILY HISTORY:  family history is not on file. SOCIAL HISTORY:  reports that he has never smoked. He does not have any smokeless tobacco history on file. He reports that he does not drink alcohol.  REVIEW OF SYSTEMS:   Difficult to obtain as pt does not speak english.   SUBJECTIVE:   Denies chest pain, SOB.  Pain in buttock region improved.  VITAL SIGNS: Temp:  [97.7 F (36.5  C)-99.8 F (37.7 C)] 97.9 F (36.6 C) (07/11 2129) Pulse Rate:  [77-107] 88 (07/11 2245) Resp:  [14-31] 16 (07/11 2245) BP: (81-107)/(41-96) 86/43 mmHg (07/11 2245) SpO2:  [88 %-100 %] 98 % (07/11 2245) Weight:  [260 lb (117.935 kg)] 260 lb (117.935 kg) (07/11 1622)  PHYSICAL EXAMINATION: General: Adult hispanic male, resting in bed, in NAD. Neuro: Awake and alert, non-focal.  HEENT: [place]/AT. PERRL, sclerae anicteric. Cardiovascular: RRR, no M/R/G.  Lungs: Respirations even and unlabored.  CTA bilaterally, No W/R/R. Abdomen: BS x 4, soft, NT/ND.  Musculoskeletal: No gross deformities, no edema.  Skin: Intact, warm, no rashes.     Recent Labs Lab 06/10/16 1607 06/10/16 1639  NA 130* 132*  K 3.9 4.1  CL 96* 94*  CO2 22  --   BUN 16 21*  CREATININE 1.48* 1.40*  GLUCOSE 110* 108*    Recent Labs Lab 06/10/16 1607 06/10/16 1639  HGB 14.0 15.6  HCT 40.3 46.0  WBC 12.3*  --   PLT 367  --    Ct Abdomen Pelvis W Contrast  06/10/2016  CLINICAL DATA:  Perirectal pain with focal wound EXAM: CT ABDOMEN AND PELVIS WITH CONTRAST TECHNIQUE: Multidetector CT imaging of the abdomen and pelvis was performed using the standard protocol following bolus administration of intravenous contrast. CONTRAST:  100 mL ISOVUE-300 IOPAMIDOL (ISOVUE-300) INJECTION 61% COMPARISON:  None. FINDINGS: Lower chest:  No acute findings.  Hepatobiliary: The liver is mildly fatty infiltrated. The gallbladder is within normal limits. Pancreas: No mass, inflammatory changes, or other significant abnormality. Spleen: Within normal limits in size and appearance. Adrenals/Urinary Tract: No masses identified. No evidence of hydronephrosis. Small right renal cyst is seen. The bladder is well distended. Stomach/Bowel: No evidence of obstruction, inflammatory process, or abnormal fluid collections. The appendix is within normal limits. Mild diverticular change is noted without evidence of diverticulitis. Vascular/Lymphatic:  No pathologically enlarged lymph nodes. No evidence of abdominal aortic aneurysm. Reproductive: No mass or other significant abnormality. Other: Fat containing inguinal hernia is seen. In the right gluteal region medially of there is some inflammatory change and a small amount of subcutaneous air likely representing inflammation. Additionally more superiorly adjacent to the prostate and rectum there are areas of air and fluid identified. The largest of these lies just lateral to the prostate and measures 3.4 x 2.4 x 14.7 cm in greatest AP, transverse and craniocaudad projections respectively. This is best visualized on image number 99 of series 2 as well as images 111 of series 5 and 97 of series 6. These are consistent with small abscesses Musculoskeletal:  No suspicious bone lesions identified. IMPRESSION: Air-fluid collections adjacent to the prostate laterally on the right consistent with abscesses. Smaller collection is noted somewhat more posteriorly adjacent to the distal rectum. Additionally some inflammatory changes in the right gluteal region medially are seen. Electronically Signed   By: Alcide Clever M.D.   On: 06/10/2016 18:21   Dg Chest Port 1 View  06/10/2016  CLINICAL DATA:  Possible sepsis EXAM: PORTABLE CHEST 1 VIEW COMPARISON:  None. FINDINGS: The heart size is enlarged. There is no focal infiltrate, pulmonary edema, or pleural effusion. The lung volumes are low. Mediastinal contour is unremarkable given the AP semi-erect technique. The visualized skeletal structures are unremarkable. IMPRESSION: Cardiomegaly.  No focal pneumonia. Electronically Signed   By: Sherian Rein M.D.   On: 06/10/2016 16:47    STUDIES:  CT A / P 7/11 > air fluid collections adjacent to prostate on right c/w abscess.  SIGNIFICANT EVENTS  7/11 > admitted with gluteal abscess, taken to OR for I&D.  ANTIBIOTICS: Vanc 07/11 > Zosyn 07/11 >  CULTURES: Blood 07/11 > Urine 07/11 > Wound 07/11 >  ASSESSMENT /  PLAN:  Sepsis due to large right guteal abscess - s/p I&D 07/11  by Dr. Corliss Skains. Plan: OK to admit to SDU. Continue empiric abx. Follow cultures. Post op care per CCS.  Hypotension - improving with IVF's.  Of note in addition to abscess, pt has had decreased appetite for roughly 12 days. Plan: Continue aggressive hydration.  AKI. Plan: Continue IVF's. BMP in AM.  Rest per primary team.   Rutherford Guys, PA - [facility] Pager: [phone]  or [phone] 06/10/2016, 10:57 PM

## 2016-06-10 NOTE — Consult Note (Signed)
Reason for Consult:Large gluteal abscess Referring Physician: [place], EDP  Justin Sampson is an 60 y.o. male.  HPI: 60 yo obese Hispanic male presents with 12 days of poor appetite and general malaise, and five days of worsening pain in the right buttock.  He finally came to the ED complaining of drainage from the buttock.  In the ED triage area, he was found to be tachycardic, hypotensive, with possible syncope.  He was incontinent of stool.  He now seems to have normal mental status.  He has received several fluid boluses but remains hypotensive.  Telephone interpreter used.  Past Medical History  Diagnosis Date  . Hypertension   "Pre-diabetes"   History reviewed. No pertinent past surgical history.  No family history on file.  Social History:  reports that he has never smoked. He does not have any smokeless tobacco history on file. He reports that he does not drink alcohol. His drug history is not on file.  Allergies: No Known Allergies  Medications:  Prior to Admission medications   Medication Sig Start Date End Date Taking? Authorizing Provider  naproxen (NAPROSYN) 500 MG tablet Take 500 mg by mouth daily as needed. 10/20/15  Yes Historical Provider, MD  losartan-hydrochlorothiazide (HYZAAR) 100-12.5 MG tablet Take 1 tablet by mouth daily.    Historical Provider, MD     Results for orders placed or performed during the hospital encounter of 06/10/16 (from the past 48 hour(s))  CBG monitoring, ED     Status: Abnormal   Collection Time: 06/10/16  4:05 PM  Result Value Ref Range   Glucose-Capillary 122 (H) 65 - 99 mg/dL  Comprehensive metabolic panel     Status: Abnormal   Collection Time: 06/10/16  4:07 PM  Result Value Ref Range   Sodium 130 (L) 135 - 145 mmol/L   Potassium 3.9 3.5 - 5.1 mmol/L   Chloride 96 (L) 101 - 111 mmol/L   CO2 22 22 - 32 mmol/L   Glucose, Bld 110 (H) 65 - 99 mg/dL   BUN 16 6 - 20 mg/dL   Creatinine, Ser 1.48 (H) 0.61 - 1.24 mg/dL   Calcium 8.9 8.9 -  10.3 mg/dL   Total Protein 6.5 6.5 - 8.1 g/dL   Albumin 2.6 (L) 3.5 - 5.0 g/dL   AST 29 15 - 41 U/L   ALT 26 17 - 63 U/L   Alkaline Phosphatase 52 38 - 126 U/L   Total Bilirubin 0.5 0.3 - 1.2 mg/dL   GFR calc non Af Amer 50 (L) >60 mL/min   GFR calc Af Amer 58 (L) >60 mL/min    Comment: (NOTE) The eGFR has been calculated using the CKD EPI equation. This calculation has not been validated in all clinical situations. eGFR's persistently <60 mL/min signify possible Chronic Kidney Disease.    Anion gap 12 5 - 15  CBC with Differential     Status: Abnormal   Collection Time: 06/10/16  4:07 PM  Result Value Ref Range   WBC 12.3 (H) 4.0 - 10.5 K/uL   RBC 4.43 4.22 - 5.81 MIL/uL   Hemoglobin 14.0 13.0 - 17.0 g/dL   HCT 40.3 39.0 - 52.0 %   MCV 91.0 78.0 - 100.0 fL   MCH 31.6 26.0 - 34.0 pg   MCHC 34.7 30.0 - 36.0 g/dL   RDW 14.4 11.5 - 15.5 %   Platelets 367 150 - 400 K/uL   Neutrophils Relative % 77 %   Lymphocytes Relative 17 %  Monocytes Relative 5 %   Eosinophils Relative 1 %   Basophils Relative 0 %   Neutro Abs 9.5 (H) 1.7 - 7.7 K/uL   Lymphs Abs 2.1 0.7 - 4.0 K/uL   Monocytes Absolute 0.6 0.1 - 1.0 K/uL   Eosinophils Absolute 0.1 0.0 - 0.7 K/uL   Basophils Absolute 0.0 0.0 - 0.1 K/uL   WBC Morphology INCREASED BANDS (>20% BANDS)     Comment: ATYPICAL LYMPHOCYTES TOXIC GRANULATION    Smear Review LARGE PLATELETS PRESENT   I-Stat CG4 Lactic Acid, ED     Status: Abnormal   Collection Time: 06/10/16  4:30 PM  Result Value Ref Range   Lactic Acid, Venous 2.90 (HH) 0.5 - 1.9 mmol/L   Comment NOTIFIED PHYSICIAN   I-Stat Chem 8, ED     Status: Abnormal   Collection Time: 06/10/16  4:39 PM  Result Value Ref Range   Sodium 132 (L) 135 - 145 mmol/L   Potassium 4.1 3.5 - 5.1 mmol/L   Chloride 94 (L) 101 - 111 mmol/L   BUN 21 (H) 6 - 20 mg/dL   Creatinine, Ser 1.40 (H) 0.61 - 1.24 mg/dL   Glucose, Bld 108 (H) 65 - 99 mg/dL   Calcium, Ion 1.04 (L) 1.13 - 1.30 mmol/L    TCO2 25 0 - 100 mmol/L   Hemoglobin 15.6 13.0 - 17.0 g/dL   HCT 46.0 39.0 - 52.0 %  POC occult blood, ED Provider will collect     Status: Abnormal   Collection Time: 06/10/16  5:42 PM  Result Value Ref Range   Fecal Occult Bld POSITIVE (A) NEGATIVE  Urinalysis, Routine w reflex microscopic     Status: None   Collection Time: 06/10/16  6:50 PM  Result Value Ref Range   Color, Urine YELLOW YELLOW   APPearance CLEAR CLEAR   Specific Gravity, Urine 1.029 1.005 - 1.030   pH 6.0 5.0 - 8.0   Glucose, UA NEGATIVE NEGATIVE mg/dL   Hgb urine dipstick NEGATIVE NEGATIVE   Bilirubin Urine NEGATIVE NEGATIVE   Ketones, ur NEGATIVE NEGATIVE mg/dL   Protein, ur NEGATIVE NEGATIVE mg/dL   Nitrite NEGATIVE NEGATIVE   Leukocytes, UA NEGATIVE NEGATIVE    Comment: MICROSCOPIC NOT DONE ON URINES WITH NEGATIVE PROTEIN, BLOOD, LEUKOCYTES, NITRITE, OR GLUCOSE <1000 mg/dL.    Ct Abdomen Pelvis W Contrast  06/10/2016  CLINICAL DATA:  Perirectal pain with focal wound EXAM: CT ABDOMEN AND PELVIS WITH CONTRAST TECHNIQUE: Multidetector CT imaging of the abdomen and pelvis was performed using the standard protocol following bolus administration of intravenous contrast. CONTRAST:  100 mL ISOVUE-300 IOPAMIDOL (ISOVUE-300) INJECTION 61% COMPARISON:  None. FINDINGS: Lower chest:  No acute findings. Hepatobiliary: The liver is mildly fatty infiltrated. The gallbladder is within normal limits. Pancreas: No mass, inflammatory changes, or other significant abnormality. Spleen: Within normal limits in size and appearance. Adrenals/Urinary Tract: No masses identified. No evidence of hydronephrosis. Small right renal cyst is seen. The bladder is well distended. Stomach/Bowel: No evidence of obstruction, inflammatory process, or abnormal fluid collections. The appendix is within normal limits. Mild diverticular change is noted without evidence of diverticulitis. Vascular/Lymphatic: No pathologically enlarged lymph nodes. No evidence of  abdominal aortic aneurysm. Reproductive: No mass or other significant abnormality. Other: Fat containing inguinal hernia is seen. In the right gluteal region medially of there is some inflammatory change and a small amount of subcutaneous air likely representing inflammation. Additionally more superiorly adjacent to the prostate and rectum there are areas of air  and fluid identified. The largest of these lies just lateral to the prostate and measures 3.4 x 2.4 x 14.7 cm in greatest AP, transverse and craniocaudad projections respectively. This is best visualized on image number 99 of series 2 as well as images 111 of series 5 and 97 of series 6. These are consistent with small abscesses Musculoskeletal:  No suspicious bone lesions identified. IMPRESSION: Air-fluid collections adjacent to the prostate laterally on the right consistent with abscesses. Smaller collection is noted somewhat more posteriorly adjacent to the distal rectum. Additionally some inflammatory changes in the right gluteal region medially are seen. Electronically Signed   By: Inez Catalina M.D.   On: 06/10/2016 18:21   Dg Chest Port 1 View  06/10/2016  CLINICAL DATA:  Possible sepsis EXAM: PORTABLE CHEST 1 VIEW COMPARISON:  None. FINDINGS: The heart size is enlarged. There is no focal infiltrate, pulmonary edema, or pleural effusion. The lung volumes are low. Mediastinal contour is unremarkable given the AP semi-erect technique. The visualized skeletal structures are unremarkable. IMPRESSION: Cardiomegaly.  No focal pneumonia. Electronically Signed   By: Abelardo Diesel M.D.   On: 06/10/2016 16:47    Review of Systems  Constitutional: Positive for fever and malaise/fatigue. Negative for weight loss.  HENT: Negative for ear discharge, ear pain, hearing loss and tinnitus.   Eyes: Negative for blurred vision, double vision, photophobia and pain.  Respiratory: Negative for cough, sputum production and shortness of breath.   Cardiovascular:  Negative for chest pain.  Gastrointestinal: Positive for diarrhea and blood in stool. Negative for nausea, vomiting and abdominal pain.       Right buttock pain and drainage   Genitourinary: Negative for dysuria, urgency, frequency and flank pain.  Musculoskeletal: Negative for myalgias, back pain, joint pain, falls and neck pain.  Neurological: Positive for weakness. Negative for dizziness, tingling, sensory change, focal weakness, loss of consciousness and headaches.  Endo/Heme/Allergies: Does not bruise/bleed easily.  Psychiatric/Behavioral: Negative for depression, memory loss and substance abuse. The patient is not nervous/anxious.    Blood pressure 98/52, pulse 90, temperature 99.8 F (37.7 C), temperature source Oral, resp. rate 31, weight 117.935 kg (260 lb), SpO2 89 %. Physical Exam Obese male - does not speak English; NAD; able to use phone interpreter HEENT - EOMI, sclera anicteric Dry mucous membranes CV - Tachycardic; regular rhythm Lungs - CTA B Abd - obese; soft, non-tender Rectal - incontinent of diarrhea; Large right buttock cellulitis/ draining abscess - two openings; cellulitis covers about 15 cm;  Assessment/Plan: Large right buttock abscess Sepsis with hypotension/ lactic acidosis  Recs:  Needs immediate operative debridement and drainage of abscess.  The surgical procedure has been discussed with the patient.  Potential risks, benefits, alternative treatments, and expected outcomes have been explained.  All of the patient's questions at this time have been answered.  The likelihood of reaching the patient's treatment goal is good.  The patient understand the proposed surgical procedure and wishes to proceed.  Will likely need ICU post-op - possible prolonged mechanical ventilation. Have asked CCM to admit the patient after surgery.  Imogene Burn. Georgette Dover, MD, [facility]  General/ Trauma Surgery  06/10/2016 7:47 PM   TSUEI,MATTHEW K. 06/10/2016,  7:38 PM

## 2016-06-10 NOTE — Anesthesia Postprocedure Evaluation (Signed)
Anesthesia Post Note  Patient: Justin Sampson  Procedure(s) Performed: Procedure(s) (LRB): INCISION AND DRAINAGE GLUTEAL ABSCESS (Right)  Patient location during evaluation: PACU Anesthesia Type: General Level of consciousness: awake, oriented and patient cooperative Pain management: pain level controlled Vital Signs Assessment: post-procedure vital signs reviewed and stable Respiratory status: spontaneous breathing and respiratory function stable Cardiovascular status: blood pressure returned to baseline and stable Anesthetic complications: no    Last Vitals:  Filed Vitals:   06/10/16 2200 06/10/16 2215  BP: 98/51 98/41  Pulse: 89 90  Temp:    Resp: 22 23    Last Pain:  Filed Vitals:   06/10/16 2217  PainSc: 0-No pain                 Katalina Magri EDWARD

## 2016-06-10 NOTE — Transfer of Care (Signed)
Immediate Anesthesia Transfer of Care Note  Patient: Justin Sampson  Procedure(s) Performed: Procedure(s): INCISION AND DRAINAGE GLUTEAL ABSCESS (Right)  Patient Location: PACU  Anesthesia Type:General  Level of Consciousness: awake, alert  and patient cooperative  Airway & Oxygen Therapy: Patient Spontanous Breathing and Patient connected to face mask oxygen  Post-op Assessment: Report given to RN and Post -op Vital signs reviewed and stable  Post vital signs: Reviewed and stable  Last Vitals:  Filed Vitals:   06/10/16 2129 06/10/16 2130  BP: 90/68   Pulse: 104 103  Temp: 36.6 C   Resp: 27 24    Last Pain:  Filed Vitals:   06/10/16 2132  PainSc: 8          Complications: No apparent anesthesia complications

## 2016-06-10 NOTE — ED Notes (Signed)
Patient was in triage, nurse states he started shaking all over and diaphoretic. Patient seems postictle at bedside, able to communicate. Using translator phone, pt speaks spanish. CBG 122. Chief complaint is wound infection. Patient was incontinent of stool. Patient denies medical history other than HTN.

## 2016-06-10 NOTE — ED Provider Notes (Addendum)
Medical screening examination/treatment/procedure(s) were conducted as a shared visit with non-physician practitioner(s) and myself.  I personally evaluated the patient during the encounter.  60 year old male here with progressive worsening left leg pain and wound. Found to be tachycardic, hypotensive and possible episode of syncope in the triage so brought back immediately.   He had a tactile warmth on exam with significant wounds to his buttock. Also with incontinent of stool. Mental status seemed very appropriate. Slightly tachycardic and blood pressures in the 80s over 50s. Sepsis initiated for concern of sialitis versus abscess of his gluteal area.  We'll evaluate for response to his interventions and disposition to the hospital appropriately.  CRITICAL CARE Performed by: Marily Memos Total critical care time: 35 minutes Critical care time was exclusive of separately billable procedures and treating other patients. Critical care was necessary to treat or prevent imminent or life-threatening deterioration. Critical care was time spent personally by me on the following activities: development of treatment plan with patient and/or surrogate as well as nursing, discussions with consultants, evaluation of patient's response to treatment, examination of patient, obtaining history from patient or surrogate, ordering and performing treatments and interventions, ordering and review of laboratory studies, ordering and review of radiographic studies, pulse oximetry and re-evaluation of patient's condition.   Marily Memos, MD 06/13/16 [phone]

## 2016-06-11 ENCOUNTER — Encounter (HOSPITAL_COMMUNITY): Payer: Self-pay | Admitting: Surgery

## 2016-06-11 DIAGNOSIS — A419 Sepsis, unspecified organism: Secondary | ICD-10-CM | POA: Diagnosis present

## 2016-06-11 LAB — CBC
HCT: 30.1 % — ABNORMAL LOW (ref 39.0–52.0)
HEMOGLOBIN: 10.3 g/dL — AB (ref 13.0–17.0)
MCH: 31.7 pg (ref 26.0–34.0)
MCHC: 34.2 g/dL (ref 30.0–36.0)
MCV: 92.6 fL (ref 78.0–100.0)
PLATELETS: 285 10*3/uL (ref 150–400)
RBC: 3.25 MIL/uL — AB (ref 4.22–5.81)
RDW: 15 % (ref 11.5–15.5)
WBC: 9.1 10*3/uL (ref 4.0–10.5)

## 2016-06-11 LAB — TROPONIN I
TROPONIN I: 0.04 ng/mL — AB (ref <0.03)
TROPONIN I: 0.03 ng/mL — AB (ref <0.03)
Troponin I: 0.03 ng/mL (ref <0.03)

## 2016-06-11 LAB — BASIC METABOLIC PANEL
Anion gap: 8 (ref 5–15)
BUN: 12 mg/dL (ref 6–20)
CALCIUM: 7.8 mg/dL — AB (ref 8.9–10.3)
CHLORIDE: 106 mmol/L (ref 101–111)
CO2: 19 mmol/L — AB (ref 22–32)
CREATININE: 1 mg/dL (ref 0.61–1.24)
GFR calc non Af Amer: >60 mL/min (ref >60)
Glucose, Bld: 101 mg/dL — ABNORMAL HIGH (ref 65–99)
Potassium: 3.4 mmol/L — ABNORMAL LOW (ref 3.5–5.1)
Sodium: 133 mmol/L — ABNORMAL LOW (ref 135–145)

## 2016-06-11 LAB — HEPATIC FUNCTION PANEL
ALBUMIN: 2 g/dL — AB (ref 3.5–5.0)
ALT: 20 U/L (ref 17–63)
AST: 20 U/L (ref 15–41)
Alkaline Phosphatase: 39 U/L (ref 38–126)
BILIRUBIN INDIRECT: 0.4 mg/dL (ref 0.3–0.9)
Bilirubin, Direct: 0.1 mg/dL (ref 0.1–0.5)
TOTAL PROTEIN: 4.9 g/dL — AB (ref 6.5–8.1)
Total Bilirubin: 0.5 mg/dL (ref 0.3–1.2)

## 2016-06-11 LAB — BLOOD GAS, ARTERIAL
Acid-base deficit: 0.2 mmol/L (ref 0.0–2.0)
Bicarbonate: 24.3 mEq/L — ABNORMAL HIGH (ref 20.0–24.0)
DRAWN BY: 422461
FIO2: 0.28
O2 CONTENT: 2 L/min
O2 SAT: 95.1 %
PH ART: 7.379 (ref 7.350–7.450)
Patient temperature: 98.6
TCO2: 25.6 mmol/L (ref 0–100)
pCO2 arterial: 42.2 mmHg (ref 35.0–45.0)
pO2, Arterial: 83.1 mmHg (ref 80.0–100.0)

## 2016-06-11 LAB — RPR: RPR Ser Ql: NONREACTIVE

## 2016-06-11 LAB — CORTISOL: CORTISOL PLASMA: 7.9 ug/dL

## 2016-06-11 LAB — MAGNESIUM: MAGNESIUM: 1.6 mg/dL — AB (ref 1.7–2.4)

## 2016-06-11 LAB — MRSA PCR SCREENING: MRSA by PCR: NEGATIVE

## 2016-06-11 LAB — PHOSPHORUS: PHOSPHORUS: 3.6 mg/dL (ref 2.5–4.6)

## 2016-06-11 MED ORDER — OXYCODONE HCL 5 MG PO TABS
5.0000 mg | ORAL_TABLET | ORAL | Status: DC | PRN
  Administered 2016-06-11 – 2016-06-12 (×2): 10 mg via ORAL
  Filled 2016-06-11 (×3): qty 2

## 2016-06-11 MED ORDER — MORPHINE SULFATE (PF) 2 MG/ML IV SOLN: 2.0000 mg | INTRAVENOUS | Status: DC | PRN

## 2016-06-11 MED ORDER — SODIUM CHLORIDE 0.9 % IV BOLUS (SEPSIS)
1000.0000 mL | Freq: Once | INTRAVENOUS | Status: AC
Start: 2016-06-11 — End: 2016-06-11
  Administered 2016-06-11: 1000 mL via INTRAVENOUS

## 2016-06-11 MED ORDER — FAMOTIDINE 20 MG PO TABS
20.0000 mg | ORAL_TABLET | Freq: Every day | ORAL | Status: DC
  Administered 2016-06-11 – 2016-06-22 (×11): 20 mg via ORAL
  Filled 2016-06-11 (×11): qty 1

## 2016-06-11 MED ORDER — MAGNESIUM SULFATE 50 % IJ SOLN
3.0000 g | Freq: Once | INTRAVENOUS | Status: AC
  Administered 2016-06-11: 3 g via INTRAVENOUS
  Filled 2016-06-11: qty 6

## 2016-06-11 MED ORDER — POTASSIUM CHLORIDE 10 MEQ/100ML IV SOLN
10.0000 meq | INTRAVENOUS | Status: AC
  Administered 2016-06-11 (×4): 10 meq via INTRAVENOUS
  Filled 2016-06-11 (×4): qty 100

## 2016-06-11 MED ORDER — MORPHINE SULFATE (PF) 2 MG/ML IV SOLN
2.0000 mg | INTRAVENOUS | Status: DC | PRN
  Administered 2016-06-12 (×2): 2 mg via INTRAVENOUS
  Filled 2016-06-11 (×2): qty 1

## 2016-06-11 MED ORDER — POTASSIUM CHLORIDE CRYS ER 20 MEQ PO TBCR
30.0000 meq | EXTENDED_RELEASE_TABLET | Freq: Once | ORAL | Status: AC
  Administered 2016-06-11: 30 meq via ORAL
  Filled 2016-06-11: qty 1

## 2016-06-11 MED ORDER — CETYLPYRIDINIUM CHLORIDE 0.05 % MT LIQD
7.0000 mL | Freq: Two times a day (BID) | OROMUCOSAL | Status: DC
  Administered 2016-06-11 – 2016-06-21 (×16): 7 mL via OROMUCOSAL

## 2016-06-11 MED ORDER — VANCOMYCIN HCL IN DEXTROSE 1-5 GM/200ML-% IV SOLN
1000.0000 mg | Freq: Two times a day (BID) | INTRAVENOUS | Status: DC
  Administered 2016-06-11 – 2016-06-13 (×5): 1000 mg via INTRAVENOUS
  Filled 2016-06-11 (×7): qty 200

## 2016-06-11 NOTE — Progress Notes (Signed)
Pt had a 5 beat run of Vtach. BP 105/71 HR 86. Pt states that he feels fine.  Dr. Marchelle Gearing notified. Orders given.

## 2016-06-11 NOTE — Care Management Note (Signed)
Case Management Note  Patient Details  Name: Justin Sampson MRN: [id number] Date of Birth: 17-Jun-1956  Subjective/Objective:  Pt is currently living with his son but states he may move in with a friend.  CM used interpreter line to provide pt with information [facility] [employer] that has IT sales professional, provided print-out with contact information and clinic hours.                             Expected Discharge Plan:  Home/Self Care  Discharge planning Services  CM Consult, [facility], [place], [place] 06/11/2016, 3:02 PM

## 2016-06-11 NOTE — Progress Notes (Signed)
Pharmacy Antibiotic Note  Justin Sampson is a 60 y.o. male admitted on 06/10/2016 with wounds around rectum area and abscesses.  Patient is s/p I&D on 06/10/16.  Pharmacy managing vancomycin and Zosyn.  Patient's renal function is improving.   Plan: - Change vanc to 1gm IV Q12H - Continue Zosyn 3.375gm IV Q8H, 4 hr infusion - Monitor renal fxn, micro data, vanc trough as indicated - F/U discontinuing Protonix gtt given no GIB   Height: 5\' 6"  (167.6 cm) Weight: 286 lb 2.5 oz (129.8 kg) IBW/kg (Calculated) : 63.8  Temp (24hrs), Avg:98.5 F (36.9 C), Min:97.7 F (36.5 C), Max:99.8 F (37.7 C)   Recent Labs Lab 06/10/16 1607 06/10/16 1630 06/10/16 1639 06/10/16 1921 06/11/16 0501  WBC 12.3*  --   --   --  9.1  CREATININE 1.48*  --  1.40*  --  1.00  LATICACIDVEN  --  2.90*  --  1.11  --     Estimated Creatinine Clearance: 102.7 mL/min (by C-G formula based on Cr of 1).    No Known Allergies    Antimicrobials this admission: 7/11 Zosyn >>  7/11 Vancomycin  >>   Dose adjustments this admission: N/A  Microbiology results: 7/11 BCx x2 - 7/11 MRSA PCR - negative 7/11 UCx -  7/11 rectum wound cx - GPC / GVR on Gram stain 7/11 abscess - GPC / GVR on Gram stain   Loraine Freid D. Laney Potash, PharmD, BCPS Pager:  [phone] 06/11/2016, 9:14 AM

## 2016-06-11 NOTE — Progress Notes (Signed)
1 Day Post-Op  Subjective: Less pain than before surgery  Objective: Vital signs in last 24 hours: Temp:  [97.7 F (36.5 C)-99.8 F (37.7 C)] 97.8 F (36.6 C) (07/12 0723) Pulse Rate:  [77-107] 93 (07/12 0900) Resp:  [14-37] 37 (07/12 0900) BP: (74-117)/(40-96) 91/58 mmHg (07/12 0900) SpO2:  [87 %-100 %] 87 % (07/12 0900) Weight:  [117.935 kg (260 lb)-129.8 kg (286 lb 2.5 oz)] 129.8 kg (286 lb 2.5 oz) (07/12 0650) Last BM Date: 06/10/16  Intake/Output from previous day: 07/11 0701 - 07/12 0700 In: 1800 [I.V.:1650; IV Piggyback:150] Out: 905 [Urine:900; Blood:5] Intake/Output this shift: Total I/O In: 360 [P.O.:360] Out: -   General appearance: cooperative Incision/Wound: Penroses in place, some erythema but tissue viable, draining well  Lab Results:   Recent Labs  06/10/16 1607 06/10/16 1639 06/11/16 0501  WBC 12.3*  --  9.1  HGB 14.0 15.6 10.3*  HCT 40.3 46.0 30.1*  PLT 367  --  285   BMET  Recent Labs  06/10/16 1607 06/10/16 1639 06/11/16 0501  NA 130* 132* 133*  K 3.9 4.1 3.4*  CL 96* 94* 106  CO2 22  --  19*  GLUCOSE 110* 108* 101*  BUN 16 21* 12  CREATININE 1.48* 1.40* 1.00  CALCIUM 8.9  --  7.8*   PT/INR No results for input(s): LABPROT, INR in the last 72 hours. ABG  Recent Labs  06/11/16 0559  PHART 7.379  HCO3 24.3*    Studies/Results: Ct Abdomen Pelvis W Contrast  06/10/2016  CLINICAL DATA:  Perirectal pain with focal wound EXAM: CT ABDOMEN AND PELVIS WITH CONTRAST TECHNIQUE: Multidetector CT imaging of the abdomen and pelvis was performed using the standard protocol following bolus administration of intravenous contrast. CONTRAST:  100 mL ISOVUE-300 IOPAMIDOL (ISOVUE-300) INJECTION 61% COMPARISON:  None. FINDINGS: Lower chest:  No acute findings. Hepatobiliary: The liver is mildly fatty infiltrated. The gallbladder is within normal limits. Pancreas: No mass, inflammatory changes, or other significant abnormality. Spleen: Within normal  limits in size and appearance. Adrenals/Urinary Tract: No masses identified. No evidence of hydronephrosis. Small right renal cyst is seen. The bladder is well distended. Stomach/Bowel: No evidence of obstruction, inflammatory process, or abnormal fluid collections. The appendix is within normal limits. Mild diverticular change is noted without evidence of diverticulitis. Vascular/Lymphatic: No pathologically enlarged lymph nodes. No evidence of abdominal aortic aneurysm. Reproductive: No mass or other significant abnormality. Other: Fat containing inguinal hernia is seen. In the right gluteal region medially of there is some inflammatory change and a small amount of subcutaneous air likely representing inflammation. Additionally more superiorly adjacent to the prostate and rectum there are areas of air and fluid identified. The largest of these lies just lateral to the prostate and measures 3.4 x 2.4 x 14.7 cm in greatest AP, transverse and craniocaudad projections respectively. This is best visualized on image number 99 of series 2 as well as images 111 of series 5 and 97 of series 6. These are consistent with small abscesses Musculoskeletal:  No suspicious bone lesions identified. IMPRESSION: Air-fluid collections adjacent to the prostate laterally on the right consistent with abscesses. Smaller collection is noted somewhat more posteriorly adjacent to the distal rectum. Additionally some inflammatory changes in the right gluteal region medially are seen. Electronically Signed   By: Alcide Clever M.D.   On: 06/10/2016 18:21   Dg Chest Port 1 View  06/10/2016  CLINICAL DATA:  Possible sepsis EXAM: PORTABLE CHEST 1 VIEW COMPARISON:  None. FINDINGS: The  heart size is enlarged. There is no focal infiltrate, pulmonary edema, or pleural effusion. The lung volumes are low. Mediastinal contour is unremarkable given the AP semi-erect technique. The visualized skeletal structures are unremarkable. IMPRESSION:  Cardiomegaly.  No focal pneumonia. Electronically Signed   By: Sherian Rein M.D.   On: 06/10/2016 16:47    Anti-infectives: Anti-infectives    Start     Dose/Rate Route Frequency Ordered Stop   06/11/16 1000  vancomycin (VANCOCIN) IVPB 1000 mg/200 mL premix     1,000 mg 200 mL/hr over 60 Minutes Intravenous Every 12 hours 06/11/16 0915     06/11/16 0700  vancomycin (VANCOCIN) IVPB 750 mg/150 ml premix  Status:  Discontinued     750 mg 150 mL/hr over 60 Minutes Intravenous Every 12 hours 06/10/16 1721 06/11/16 0915   06/10/16 2300  piperacillin-tazobactam (ZOSYN) IVPB 3.375 g     3.375 g 12.5 mL/hr over 240 Minutes Intravenous Every 8 hours 06/10/16 1721     06/10/16 1745  vancomycin (VANCOCIN) IVPB 1000 mg/200 mL premix     1,000 mg 200 mL/hr over 60 Minutes Intravenous  Once 06/10/16 1632 06/10/16 1839   06/10/16 1630  piperacillin-tazobactam (ZOSYN) IVPB 3.375 g     3.375 g 100 mL/hr over 30 Minutes Intravenous  Once 06/10/16 1624 06/10/16 1814   06/10/16 1630  vancomycin (VANCOCIN) IVPB 1000 mg/200 mL premix     1,000 mg 200 mL/hr over 60 Minutes Intravenous  Once 06/10/16 1624 06/10/16 1945      Assessment/Plan: s/p Procedure(s): INCISION AND DRAINAGE GLUTEAL ABSCESS (Right) POD#1 MT Change outer gauze BID Vanc/Zosyn for now pending cultures Advance diet  LOS: 1 day    Justin Sampson E 06/11/2016

## 2016-06-11 NOTE — Progress Notes (Signed)
[place] TEAM 1 - Stepdown/ICU TEAM  Justin Sampson  KFM:[id number] DOB: [birth year] DOA: 06/10/2016 PCP: No primary care provider on file.    Brief Narrative:  60 y.o. male with hx only of HTN who presented to ED with "two sores on his bottom", very painful and weren't healing. In ED pt was hypotensive. ED MD noted black stool on rectal exam and started a PPI drip. CT pelvis showed gluteal abscess tracking towards the prostate with soft-tissue gas. He was taken to the OR by Dr Corliss Skains with drainage of a large abscess.     Subjective: The pt is resting comfortably.  He has no new complaints at this time.  Gen Surgery has evaluated him today.    Assessment & Plan:  Sepsis due to large R Gluteal abscess Now s/p I&D w/ wound care per [facility] - cont broad abx coverage for now, and narrow as able when culture data available   Hypotension Much improved w/ volume resuscitation - if remains stable another 24hrs could likely be trans to the surgical floor   Melena?- Normocytic anemia  black stool according to ED MD - no clear evidence of active bleeding - stop PPI gtt and change to oral - follow Hgb, w/ current "drop" likely due to aggressive volume expansion and not blood loss   Hx HTN holding meds - may soon require resumption w/ volume expansion   Renal insuff Appears most c/w simple prerenal azotemia - recheck in AM   NSVT Maximize Mg - asymptomatic - check TTE to r/o structural heart disease - follow on tele   Hypomagnesemia Replace and follow  Hypokalemia Replace and follow  Morbid Obesity - Body mass index is 46.21 kg/(m^2).   DVT prophylaxis: SCDs Code Status: FULL CODE Family Communication: no family present at time of exam  Disposition Plan: SDU until clear if Hgb and BP stable   Consultants:  Gen Surgery PCCM  Procedures: 7/11 I&D large R gluteal abscess in OR   Antimicrobials:  Zosyn 7/11 > Vanc 7/11 >  Objective: Blood pressure 113/73, pulse 98,  temperature 99.2 F (37.3 C), temperature source Oral, resp. rate 24, height 5\' 6"  (1.676 m), weight 129.8 kg (286 lb 2.5 oz), SpO2 98 %.  Intake/Output Summary (Last 24 hours) at 06/11/16 1139 Last data filed at 06/11/16 0900  Gross per 24 hour  Intake   2160 ml  Output    905 ml  Net   1255 ml   Filed Weights   06/10/16 1622 06/10/16 2341 06/11/16 0650  Weight: 117.935 kg (260 lb) 126.7 kg (279 lb 5.2 oz) 129.8 kg (286 lb 2.5 oz)    Examination: General: No acute respiratory distress Lungs: Clear to auscultation bilaterally without wheezes or crackles Cardiovascular: Regular rate and rhythm without murmur gallop or rub normal S1 and S2 Abdomen: Nontender, nondistended, soft, bowel sounds positive, no rebound, no ascites, no appreciable mass Extremities: No significant cyanosis, clubbing, or edema bilateral lower extremities  CBC:  Recent Labs Lab 06/10/16 1607 06/10/16 1639 06/11/16 0501  WBC 12.3*  --  9.1  NEUTROABS 9.5*  --   --   HGB 14.0 15.6 10.3*  HCT 40.3 46.0 30.1*  MCV 91.0  --  92.6  PLT 367  --  285   Basic Metabolic Panel:  Recent Labs Lab 06/10/16 1607 06/10/16 1639 06/11/16 0501 06/11/16 0506  NA 130* 132* 133*  --   K 3.9 4.1 3.4*  --   CL 96* 94* 106  --  CO2 22  --  19*  --   GLUCOSE 110* 108* 101*  --   BUN 16 21* 12  --   CREATININE 1.48* 1.40* 1.00  --   CALCIUM 8.9  --  7.8*  --   MG  --   --   --  1.6*  PHOS  --   --   --  3.6   GFR: Estimated Creatinine Clearance: 102.7 mL/min (by C-G formula based on Cr of 1).  Liver Function Tests:  Recent Labs Lab 06/10/16 1607 06/11/16 0506  AST 29 20  ALT 26 20  ALKPHOS 52 39  BILITOT 0.5 0.5  PROT 6.5 4.9*  ALBUMIN 2.6* 2.0*    Cardiac Enzymes:  Recent Labs Lab 06/11/16 0506  TROPONINI 0.04*    CBG:  Recent Labs Lab 06/10/16 1605 06/10/16 2158  GLUCAP 122* 102*    Recent Results (from the past 240 hour(s))  Aerobic Culture (superficial specimen)     Status:  None (Preliminary result)   Collection Time: 06/10/16  4:26 PM  Result Value Ref Range Status   Specimen Description WOUND  Final   Special Requests RECTUM  Final   Gram Stain   Final    ABUNDANT WBC PRESENT, PREDOMINANTLY PMN ABUNDANT GRAM VARIABLE ROD MODERATE GRAM POSITIVE COCCI IN PAIRS    Culture PENDING  Incomplete   Report Status PENDING  Incomplete  Aerobic/Anaerobic Culture (surgical/deep wound)     Status: None (Preliminary result)   Collection Time: 06/10/16  9:18 PM  Result Value Ref Range Status   Specimen Description ABSCESS  Final   Special Requests RT GLUTEAL PT ON VANC  Final   Gram Stain   Final    RARE WBC PRESENT, PREDOMINANTLY PMN FEW GRAM POSITIVE COCCI IN PAIRS RARE GRAM VARIABLE ROD FEW SQUAMOUS EPITHELIAL CELLS PRESENT    Culture PENDING  Incomplete   Report Status PENDING  Incomplete  MRSA PCR Screening     Status: None   Collection Time: 06/10/16 11:51 PM  Result Value Ref Range Status   MRSA by PCR NEGATIVE NEGATIVE Final    Comment:        The GeneXpert MRSA Assay (FDA approved for NASAL specimens only), is one component of a comprehensive MRSA colonization surveillance program. It is not intended to diagnose MRSA infection nor to guide or monitor treatment for MRSA infections.      Scheduled Meds: . famotidine  20 mg Oral Daily  . piperacillin-tazobactam (ZOSYN)  IV  3.375 g Intravenous Q8H  . vancomycin  1,000 mg Intravenous Q12H   Continuous Infusions: . sodium chloride 125 mL/hr at 06/11/16 0505     LOS: 1 day   Time spent: 35 minutes   Lonia Blood, MD [facility] Office  [phone] Pager - Text Page per Loretha Stapler as per below:  On-Call/Text Page:      [email or web address]      password [email or web address]  If 7PM-7AM, please contact night-coverage [email or web address] Password [facility] 06/11/2016, 11:39 AM

## 2016-06-11 NOTE — Progress Notes (Signed)
eLink Physician-Brief Progress Note Patient Name: Justin Sampson DOB: 03/21/1956 MRN: [id number]   Date of Service  06/11/2016  HPI/Events of Note  Hypomagnesemia 1.6 & hypokalemia 3.4. Creatinine 1.0. Has peripheral IV access. Short run of wide-complex tachycardia earlier.   eICU Interventions  1. Magnesium sulfate 3 g IV now 2. KCl 10 mEq IV 4 runs 3. Continue telemetry monitoring      Intervention Category Intermediate Interventions: Electrolyte abnormality - evaluation and management  Lawanda Cousins 06/11/2016, 6:03 AM

## 2016-06-12 ENCOUNTER — Inpatient Hospital Stay (HOSPITAL_COMMUNITY): Payer: Self-pay

## 2016-06-12 DIAGNOSIS — I4729 Other ventricular tachycardia: Secondary | ICD-10-CM | POA: Diagnosis present

## 2016-06-12 DIAGNOSIS — I472 Ventricular tachycardia: Secondary | ICD-10-CM | POA: Diagnosis present

## 2016-06-12 DIAGNOSIS — E876 Hypokalemia: Secondary | ICD-10-CM

## 2016-06-12 DIAGNOSIS — A419 Sepsis, unspecified organism: Secondary | ICD-10-CM | POA: Diagnosis present

## 2016-06-12 LAB — URINE CULTURE: CULTURE: NO GROWTH

## 2016-06-12 LAB — IRON AND TIBC
IRON: 28 ug/dL — AB (ref 45–182)
Saturation Ratios: 13 % — ABNORMAL LOW (ref 17.9–39.5)
TIBC: 221 ug/dL — ABNORMAL LOW (ref 250–450)
UIBC: 193 ug/dL

## 2016-06-12 LAB — CBC
HEMATOCRIT: 34.1 % — AB (ref 39.0–52.0)
HEMOGLOBIN: 11.5 g/dL — AB (ref 13.0–17.0)
MCH: 31.3 pg (ref 26.0–34.0)
MCHC: 33.7 g/dL (ref 30.0–36.0)
MCV: 92.9 fL (ref 78.0–100.0)
Platelets: 313 10*3/uL (ref 150–400)
RBC: 3.67 MIL/uL — AB (ref 4.22–5.81)
RDW: 15 % (ref 11.5–15.5)
WBC: 7.8 10*3/uL (ref 4.0–10.5)

## 2016-06-12 LAB — MAGNESIUM: MAGNESIUM: 1.8 mg/dL (ref 1.7–2.4)

## 2016-06-12 LAB — RETICULOCYTES
RBC.: 3.67 MIL/uL — ABNORMAL LOW (ref 4.22–5.81)
RETIC COUNT ABSOLUTE: 73.4 10*3/uL (ref 19.0–186.0)
Retic Ct Pct: 2 % (ref 0.4–3.1)

## 2016-06-12 LAB — RENAL FUNCTION PANEL
ALBUMIN: 2 g/dL — AB (ref 3.5–5.0)
Anion gap: 5 (ref 5–15)
BUN: 6 mg/dL (ref 6–20)
CO2: 25 mmol/L (ref 22–32)
Calcium: 7.9 mg/dL — ABNORMAL LOW (ref 8.9–10.3)
Chloride: 103 mmol/L (ref 101–111)
Creatinine, Ser: 0.85 mg/dL (ref 0.61–1.24)
Glucose, Bld: 99 mg/dL (ref 65–99)
PHOSPHORUS: 2.7 mg/dL (ref 2.5–4.6)
POTASSIUM: 3.7 mmol/L (ref 3.5–5.1)
Sodium: 133 mmol/L — ABNORMAL LOW (ref 135–145)

## 2016-06-12 LAB — FERRITIN: FERRITIN: 301 ng/mL (ref 24–336)

## 2016-06-12 LAB — ECHOCARDIOGRAM COMPLETE
HEIGHTINCHES: 66 in
Weight: 4578.51 oz

## 2016-06-12 LAB — FOLATE: FOLATE: 19.8 ng/mL (ref >5.9)

## 2016-06-12 LAB — VITAMIN B12: VITAMIN B 12: 1260 pg/mL — AB (ref 180–914)

## 2016-06-12 MED ORDER — PERFLUTREN LIPID MICROSPHERE
1.0000 mL | INTRAVENOUS | Status: AC | PRN
  Administered 2016-06-12: 2 mL via INTRAVENOUS
  Filled 2016-06-12: qty 10

## 2016-06-12 MED ORDER — PERFLUTREN LIPID MICROSPHERE
INTRAVENOUS | Status: AC
  Filled 2016-06-12: qty 10

## 2016-06-12 NOTE — Progress Notes (Signed)
2 Days Post-Op  Subjective: Less pain  Objective: Vital signs in last 24 hours: Temp:  [97.9 F (36.6 C)-99.9 F (37.7 C)] 99.1 F (37.3 C) (07/13 0331) Pulse Rate:  [92-121] 99 (07/13 0700) Resp:  [19-37] 22 (07/13 0700) BP: (91-130)/(58-83) 130/64 mmHg (07/13 0700) SpO2:  [83 %-98 %] 83 % (07/13 0700) Last BM Date: 06/10/16  Intake/Output from previous day: 07/12 0701 - 07/13 0700 In: 3876.3 [P.O.:750; I.V.:2576.3; IV Piggyback:550] Out: 1600 [Urine:1600] Intake/Output this shift: Total I/O In: -  Out: 300 [Urine:300]  General appearance: alert and cooperative Packing removed, penroses remain, less induration  Lab Results:   Recent Labs  06/11/16 0501 06/12/16 0234  WBC 9.1 7.8  HGB 10.3* 11.5*  HCT 30.1* 34.1*  PLT 285 313   BMET  Recent Labs  06/11/16 0501 06/12/16 0234  NA 133* 133*  K 3.4* 3.7  CL 106 103  CO2 19* 25  GLUCOSE 101* 99  BUN 12 6  CREATININE 1.00 0.85  CALCIUM 7.8* 7.9*   PT/INR No results for input(s): LABPROT, INR in the last 72 hours. ABG  Recent Labs  06/11/16 0559  PHART 7.379  HCO3 24.3*   Anti-infectives: Anti-infectives    Start     Dose/Rate Route Frequency Ordered Stop   06/11/16 1000  vancomycin (VANCOCIN) IVPB 1000 mg/200 mL premix     1,000 mg 200 mL/hr over 60 Minutes Intravenous Every 12 hours 06/11/16 0915     06/11/16 0700  vancomycin (VANCOCIN) IVPB 750 mg/150 ml premix  Status:  Discontinued     750 mg 150 mL/hr over 60 Minutes Intravenous Every 12 hours 06/10/16 1721 06/11/16 0915   06/10/16 2300  piperacillin-tazobactam (ZOSYN) IVPB 3.375 g     3.375 g 12.5 mL/hr over 240 Minutes Intravenous Every 8 hours 06/10/16 1721     06/10/16 1745  vancomycin (VANCOCIN) IVPB 1000 mg/200 mL premix     1,000 mg 200 mL/hr over 60 Minutes Intravenous  Once 06/10/16 1632 06/10/16 1839   06/10/16 1630  piperacillin-tazobactam (ZOSYN) IVPB 3.375 g     3.375 g 100 mL/hr over 30 Minutes Intravenous  Once 06/10/16  1624 06/10/16 1814   06/10/16 1630  vancomycin (VANCOCIN) IVPB 1000 mg/200 mL premix     1,000 mg 200 mL/hr over 60 Minutes Intravenous  Once 06/10/16 1624 06/10/16 1945      Assessment/Plan: s/p Procedure(s): INCISION AND DRAINAGE GLUTEAL ABSCESS (Right) POD#2 MT Change outer gauze BID Vanc/Zosyn for now pending cultures (reincubated) OK for floor from our standpoint  LOS: 2 days    Safaa Stingley E 06/12/2016

## 2016-06-12 NOTE — Progress Notes (Signed)
PROGRESS NOTE    Justin Sampson  NTI:[id number] DOB: 09/01/1956 DOA: 06/10/2016 PCP: No primary care provider on file.   Brief Narrative:  60 y.o. HM SPANISH SPEAKING ONLY PMHx HTN   who presented to ED with "two sores on his bottom", very painful and weren't healing. In ED pt was hypotensive. ED MD noted black stool on rectal exam and started a PPI drip. CT pelvis showed gluteal abscess tracking towards the prostate with soft-tissue gas. He was taken to the OR by Dr Corliss Skains with drainage of a large abscess.      Assessment & Plan:   Active Problems:   Gluteal abscess   Hypotension   Essential hypertension   Acute renal insufficiency   Melanotic stools   Sepsis (HCC)   Sepsis, unspecified organism (HCC)   Nonsustained ventricular tachycardia (HCC)   Hypokalemia   Hypomagnesemia    Sepsis unspecified organism/due to large Rt Gluteal abscess (positive moderate GPC in pairs, abundant GVR) -7/11 S/P I&D and wound  care per Gen Surgery - cont broad abx coverage for now, and narrow as able when culture data available   Hypotension -Resolved    Melena?- Normocytic anemia  -black stool according to ED MD - no clear evidence of active bleeding - follow Hgb, w/ current "drop" likely due to aggressive volume expansion and not blood loss   Recent Labs Lab 06/10/16 1607 06/10/16 1639 06/11/16 0501 06/12/16 0234  HGB 14.0 15.6 10.3* 11.5*  -Stable  Hx HTN -Continue to hold BP medication    Renal insuff -Resolved    NSVT -Resolved Maximize Mg  -Echocardiogram pending to r/o structural heart disease   Hypomagnesemia -Replace and follow  Hypokalemia -Replace and follow  Morbid Obesity - Body mass index is 46.21 kg/(m^2).   DVT prophylaxis: SCD Code Status: Full Family Communication:  Disposition Plan: Per surgery   Consultants:  Gen Surgery PCCM  Procedures/Significant Events:  7/11 I&D large R gluteal abscess in OR   Cultures 7/11 rectal wound positive  moderate GPC in pairs, abundant GVR 7/11 right gluteal abscess pending 7/11 blood right hand 2 NGTD 7/11 urine negative  Antimicrobials: Zosyn 7/11 > Vanc 7/11 >   Devices    LINES / TUBES:      Continuous Infusions: . sodium chloride 100 mL/hr at 06/11/16 1303     Subjective: 7/13 A/O 4, ambulating in hallway today. Indicates pain significantly decreased from admission. Negative CP, negative SOB    Objective: Filed Vitals:   06/12/16 0858 06/12/16 1000 06/12/16 1257 06/12/16 1300  BP: 130/80 73/55 123/74 134/78  Pulse: 93 103 88 86  Temp: 97.6 F (36.4 C)  98.9 F (37.2 C)   TempSrc: Oral  Oral   Resp: 19 29    Height:      Weight:      SpO2: 92% 84% 95% 99%    Intake/Output Summary (Last 24 hours) at 06/12/16 1357 Last data filed at 06/12/16 0817  Gross per 24 hour  Intake 3016.25 ml  Output   1100 ml  Net 1916.25 ml   Filed Weights   06/10/16 1622 06/10/16 2341 06/11/16 0650  Weight: 117.935 kg (260 lb) 126.7 kg (279 lb 5.2 oz) 129.8 kg (286 lb 2.5 oz)    Examination:  General: A/O 4, ,No acute respiratory distress Eyes: negative scleral hemorrhage, negative anisocoria, negative icterus ENT: Negative Runny nose, negative gingival bleeding, Neck:  Negative scars, masses, torticollis, lymphadenopathy, JVD Lungs: Clear to auscultation bilaterally without wheezes or crackles Cardiovascular:  Regular rate and rhythm without murmur gallop or rub normal S1 and S2 Abdomen: morbid obesity, negativeabdominal pain, nondistended, positive soft, bowel sounds, no rebound, no ascites, no appreciable mass Extremities: No significant cyanosis, clubbing, or edema bilateral lower extremities Skin: Negative rashes, lesions, ulcers, surgical incisions on buttocks draining copious amount of yellowish fluid. Psychiatric:  Negative depression, negative anxiety, negative fatigue, negative mania  Central nervous system:  Cranial nerves II through XII intact, tongue/uvula  midline, all extremities muscle strength 5/5, sensation intact throughout,  negative dysarthria, negative expressive aphasia, negative receptive aphasia.  .     Data Reviewed: Care during the described time interval was provided by me .  I have reviewed this patient's available data, including medical history, events of note, physical examination, and all test results as part of my evaluation. I have personally reviewed and interpreted all radiology studies.  CBC:  Recent Labs Lab 06/10/16 1607 06/10/16 1639 06/11/16 0501 06/12/16 0234  WBC 12.3*  --  9.1 7.8  NEUTROABS 9.5*  --   --   --   HGB 14.0 15.6 10.3* 11.5*  HCT 40.3 46.0 30.1* 34.1*  MCV 91.0  --  92.6 92.9  PLT 367  --  285 313   Basic Metabolic Panel:  Recent Labs Lab 06/10/16 1607 06/10/16 1639 06/11/16 0501 06/11/16 0506 06/12/16 0234  NA 130* 132* 133*  --  133*  K 3.9 4.1 3.4*  --  3.7  CL 96* 94* 106  --  103  CO2 22  --  19*  --  25  GLUCOSE 110* 108* 101*  --  99  BUN 16 21* 12  --  6  CREATININE 1.48* 1.40* 1.00  --  0.85  CALCIUM 8.9  --  7.8*  --  7.9*  MG  --   --   --  1.6* 1.8  PHOS  --   --   --  3.6 2.7   GFR: Estimated Creatinine Clearance: 120.9 mL/min (by C-G formula based on Cr of 0.85). Liver Function Tests:  Recent Labs Lab 06/10/16 1607 06/11/16 0506 06/12/16 0234  AST 29 20  --   ALT 26 20  --   ALKPHOS 52 39  --   BILITOT 0.5 0.5  --   PROT 6.5 4.9*  --   ALBUMIN 2.6* 2.0* 2.0*   No results for input(s): LIPASE, AMYLASE in the last 168 hours. No results for input(s): AMMONIA in the last 168 hours. Coagulation Profile: No results for input(s): INR, PROTIME in the last 168 hours. Cardiac Enzymes:  Recent Labs Lab 06/11/16 0506 06/11/16 1158 06/11/16 2034  TROPONINI 0.04* 0.03* 0.03*   BNP (last 3 results) No results for input(s): PROBNP in the last 8760 hours. HbA1C: No results for input(s): HGBA1C in the last 72 hours. CBG:  Recent Labs Lab  06/10/16 1605 06/10/16 2158  GLUCAP 122* 102*   Lipid Profile: No results for input(s): CHOL, HDL, LDLCALC, TRIG, CHOLHDL, LDLDIRECT in the last 72 hours. Thyroid Function Tests: No results for input(s): TSH, T4TOTAL, FREET4, T3FREE, THYROIDAB in the last 72 hours. Anemia Panel:  Recent Labs  06/12/16 0234  VITAMINB12 1260*  FOLATE 19.8  FERRITIN 301  TIBC 221*  IRON 28*  RETICCTPCT 2.0   Urine analysis:    Component Value Date/Time   COLORURINE YELLOW 06/10/2016 1850   APPEARANCEUR CLEAR 06/10/2016 1850   LABSPEC 1.029 06/10/2016 1850   PHURINE 6.0 06/10/2016 1850   GLUCOSEU NEGATIVE 06/10/2016 1850   HGBUR  NEGATIVE 06/10/2016 1850   BILIRUBINUR NEGATIVE 06/10/2016 1850   KETONESUR NEGATIVE 06/10/2016 1850   PROTEINUR NEGATIVE 06/10/2016 1850   NITRITE NEGATIVE 06/10/2016 1850   LEUKOCYTESUR NEGATIVE 06/10/2016 1850   Sepsis Labs: @LABRCNTIP (procalcitonin:4,lacticidven:4)  ) Recent Results (from the past 240 hour(s))  Aerobic Culture (superficial specimen)     Status: None (Preliminary result)   Collection Time: 06/10/16  4:26 PM  Result Value Ref Range Status   Specimen Description WOUND  Final   Special Requests RECTUM  Final   Gram Stain   Final    ABUNDANT WBC PRESENT, PREDOMINANTLY PMN ABUNDANT GRAM VARIABLE ROD MODERATE GRAM POSITIVE COCCI IN PAIRS    Culture CULTURE REINCUBATED FOR BETTER GROWTH  Final   Report Status PENDING  Incomplete  Blood Culture (routine x 2)     Status: None (Preliminary result)   Collection Time: 06/10/16  4:39 PM  Result Value Ref Range Status   Specimen Description BLOOD RIGHT HAND  Final   Special Requests BOTTLES DRAWN AEROBIC AND ANAEROBIC 5CC  Final   Culture NO GROWTH < 24 HOURS  Final   Report Status PENDING  Incomplete  Blood Culture (routine x 2)     Status: None (Preliminary result)   Collection Time: 06/10/16  4:56 PM  Result Value Ref Range Status   Specimen Description BLOOD RIGHT HAND  Final   Special  Requests BOTTLES DRAWN AEROBIC ONLY 5CC  Final   Culture NO GROWTH < 24 HOURS  Final   Report Status PENDING  Incomplete  Urine culture     Status: None   Collection Time: 06/10/16  6:50 PM  Result Value Ref Range Status   Specimen Description URINE, CLEAN CATCH  Final   Special Requests NONE  Final   Culture NO GROWTH  Final   Report Status 06/12/2016 FINAL  Final  Aerobic/Anaerobic Culture (surgical/deep wound)     Status: None (Preliminary result)   Collection Time: 06/10/16  9:18 PM  Result Value Ref Range Status   Specimen Description ABSCESS  Final   Special Requests RT GLUTEAL PT ON VANC  Final   Gram Stain   Final    RARE WBC PRESENT, PREDOMINANTLY PMN FEW GRAM POSITIVE COCCI IN PAIRS RARE GRAM VARIABLE ROD FEW SQUAMOUS EPITHELIAL CELLS PRESENT    Culture PENDING  Incomplete   Report Status PENDING  Incomplete  MRSA PCR Screening     Status: None   Collection Time: 06/10/16 11:51 PM  Result Value Ref Range Status   MRSA by PCR NEGATIVE NEGATIVE Final    Comment:        The GeneXpert MRSA Assay (FDA approved for NASAL specimens only), is one component of a comprehensive MRSA colonization surveillance program. It is not intended to diagnose MRSA infection nor to guide or monitor treatment for MRSA infections.          Radiology Studies: Ct Abdomen Pelvis W Contrast  06/10/2016  CLINICAL DATA:  Perirectal pain with focal wound EXAM: CT ABDOMEN AND PELVIS WITH CONTRAST TECHNIQUE: Multidetector CT imaging of the abdomen and pelvis was performed using the standard protocol following bolus administration of intravenous contrast. CONTRAST:  100 mL ISOVUE-300 IOPAMIDOL (ISOVUE-300) INJECTION 61% COMPARISON:  None. FINDINGS: Lower chest:  No acute findings. Hepatobiliary: The liver is mildly fatty infiltrated. The gallbladder is within normal limits. Pancreas: No mass, inflammatory changes, or other significant abnormality. Spleen: Within normal limits in size and  appearance. Adrenals/Urinary Tract: No masses identified. No evidence of hydronephrosis. Small  right renal cyst is seen. The bladder is well distended. Stomach/Bowel: No evidence of obstruction, inflammatory process, or abnormal fluid collections. The appendix is within normal limits. Mild diverticular change is noted without evidence of diverticulitis. Vascular/Lymphatic: No pathologically enlarged lymph nodes. No evidence of abdominal aortic aneurysm. Reproductive: No mass or other significant abnormality. Other: Fat containing inguinal hernia is seen. In the right gluteal region medially of there is some inflammatory change and a small amount of subcutaneous air likely representing inflammation. Additionally more superiorly adjacent to the prostate and rectum there are areas of air and fluid identified. The largest of these lies just lateral to the prostate and measures 3.4 x 2.4 x 14.7 cm in greatest AP, transverse and craniocaudad projections respectively. This is best visualized on image number 99 of series 2 as well as images 111 of series 5 and 97 of series 6. These are consistent with small abscesses Musculoskeletal:  No suspicious bone lesions identified. IMPRESSION: Air-fluid collections adjacent to the prostate laterally on the right consistent with abscesses. Smaller collection is noted somewhat more posteriorly adjacent to the distal rectum. Additionally some inflammatory changes in the right gluteal region medially are seen. Electronically Signed   By: Alcide Clever M.D.   On: 06/10/2016 18:21   Dg Chest Port 1 View  06/10/2016  CLINICAL DATA:  Possible sepsis EXAM: PORTABLE CHEST 1 VIEW COMPARISON:  None. FINDINGS: The heart size is enlarged. There is no focal infiltrate, pulmonary edema, or pleural effusion. The lung volumes are low. Mediastinal contour is unremarkable given the AP semi-erect technique. The visualized skeletal structures are unremarkable. IMPRESSION: Cardiomegaly.  No focal  pneumonia. Electronically Signed   By: Sherian Rein M.D.   On: 06/10/2016 16:47        Scheduled Meds: . antiseptic oral rinse  7 mL Mouth Rinse BID  . famotidine  20 mg Oral Daily  . perflutren lipid microspheres (DEFINITY) IV suspension      . piperacillin-tazobactam (ZOSYN)  IV  3.375 g Intravenous Q8H  . vancomycin  1,000 mg Intravenous Q12H   Continuous Infusions: . sodium chloride 100 mL/hr at 06/11/16 1303     LOS: 2 days    Time spent: 40 minutes    WOODS, Roselind Messier, MD [facility] Pager [phone]   If 7PM-7AM, please contact night-coverage [email or web address] Password [email or web address] 06/12/2016, 1:57 PM

## 2016-06-12 NOTE — Progress Notes (Signed)
  Echocardiogram 2D Echocardiogram with Definity has been performed.  Tye Savoy 06/12/2016, 10:59 AM

## 2016-06-13 DIAGNOSIS — I5022 Chronic systolic (congestive) heart failure: Secondary | ICD-10-CM

## 2016-06-13 LAB — CBC WITH DIFFERENTIAL/PLATELET
BASOS ABS: 0 10*3/uL (ref 0.0–0.1)
Basophils Relative: 1 %
EOS PCT: 4 %
Eosinophils Absolute: 0.2 10*3/uL (ref 0.0–0.7)
HEMATOCRIT: 32.6 % — AB (ref 39.0–52.0)
Hemoglobin: 10.8 g/dL — ABNORMAL LOW (ref 13.0–17.0)
LYMPHS ABS: 1.3 10*3/uL (ref 0.7–4.0)
LYMPHS PCT: 22 %
MCH: 30.8 pg (ref 26.0–34.0)
MCHC: 33.1 g/dL (ref 30.0–36.0)
MCV: 92.9 fL (ref 78.0–100.0)
MONO ABS: 0.7 10*3/uL (ref 0.1–1.0)
MONOS PCT: 11 %
NEUTROS ABS: 3.8 10*3/uL (ref 1.7–7.7)
Neutrophils Relative %: 62 %
Platelets: 312 10*3/uL (ref 150–400)
RBC: 3.51 MIL/uL — ABNORMAL LOW (ref 4.22–5.81)
RDW: 14.7 % (ref 11.5–15.5)
WBC: 6.1 10*3/uL (ref 4.0–10.5)

## 2016-06-13 LAB — BASIC METABOLIC PANEL
ANION GAP: 9 (ref 5–15)
CHLORIDE: 101 mmol/L (ref 101–111)
CO2: 23 mmol/L (ref 22–32)
Calcium: 8.2 mg/dL — ABNORMAL LOW (ref 8.9–10.3)
Creatinine, Ser: 0.78 mg/dL (ref 0.61–1.24)
GFR calc Af Amer: >60 mL/min (ref >60)
GFR calc non Af Amer: >60 mL/min (ref >60)
GLUCOSE: 87 mg/dL (ref 65–99)
POTASSIUM: 3.5 mmol/L (ref 3.5–5.1)
Sodium: 133 mmol/L — ABNORMAL LOW (ref 135–145)

## 2016-06-13 LAB — MAGNESIUM: Magnesium: 1.9 mg/dL (ref 1.7–2.4)

## 2016-06-13 LAB — TSH: TSH: 0.581 u[IU]/mL (ref 0.350–4.500)

## 2016-06-13 NOTE — Progress Notes (Signed)
PROGRESS NOTE    Justin Sampson  ZOX:[id number] DOB: Aug 23, 1956 DOA: 06/10/2016 PCP: No primary care provider on file.   Brief Narrative:  60 y.o. HM SPANISH SPEAKING ONLY PMHx HTN and obesity who presented to ED with "two sores on his bottom", very painful and weren't healing. In ED pt was hypotensive. ED MD noted black stool on rectal exam and started a PPI drip. CT pelvis showed gluteal abscess tracking towards the prostate with soft-tissue gas. He was taken to the OR by Dr Corliss Skains with drainage/debridement of a large abscess.      Assessment & Plan: Active Problems:   Gluteal abscess   Hypotension   Essential hypertension   Acute renal insufficiency   Melanotic stools   Sepsis (HCC)   Sepsis, unspecified organism (HCC)   Nonsustained ventricular tachycardia (HCC)   Hypokalemia   Hypomagnesemia  Sepsis due to streptococcus species  -7/11 S/P I&D and wound care per [facility] Surgery -will continue zosyn and follow final culture report/sensitivity -will d/c vancomycin -continue supportive care  Hx of hypertension: presented with Hypotension -Resolved   -but BP soft and not requiring antihypertensive agents -will monitor   Melena?- Normocytic anemia  -black stool according to ED MD - no clear evidence of active bleeding appreciated - follow Hgb, w/ current "drop" likely due to aggressive volume expansion and blood loss from during surgery; instead of GIB   Recent Labs Lab 06/10/16 1607 06/10/16 1639 06/11/16 0501 06/12/16 0234 06/13/16 0230  HGB 14.0 15.6 10.3* 11.5* 10.8*  -has remained Stable  Renal insuff -Resolved  With IVF's  NSVT -Resolved Maximize Mg and potassium  -Echocardiogram demonstrating EF 35-40% and some wall motion abnormalities   Hypomagnesemia -Repleted -will monitor trend  Hypokalemia -Repleted -will monitor trend  Morbid Obesity -  -Body mass index is 46.21 kg/(m^2). -low calorie diet and exercise discussed with patient   Chronic  systolic heart failure -compensated -EF 35-40% -will need b-blocker and lisinopril at discharge -will also add low dose lasix for volume control -discussed low sodium diet with patient -will follow up daily weights and strict intake and output  -given mild elevated troponin on admission and abnormal findings on 2-d echo will ask cardiology to see.  GERD -will continue famotidine  Presumed OSA -will need outpatient sleep study and most likely CPAP at bedtime  DVT prophylaxis: SCD Code Status: Full Family Communication:  Disposition Plan: Per surgery   Consultants:  Gen Surgery PCCM  Procedures/Significant Events:  7/11 I&D large R gluteal abscess in OR   Cultures 7/11 rectal wound positive moderate GPC in pairs, abundant GVR 7/11 right gluteal abscess pending 7/11 blood right hand 2 NGTD 7/11 urine negative  Antimicrobials: Zosyn 7/11 >7/14 Vanc 7/11 >  Continuous Infusions: . sodium chloride 100 mL/hr at 06/12/16 1925     Subjective: A/O 4, in no acute distress. denies CP and SOB. Indicating pain is overall well controlled.  Objective: Filed Vitals:   06/12/16 2342 06/13/16 0411 06/13/16 0540 06/13/16 1500  BP: 119/71 118/59  113/91  Pulse: 86 104  111  Temp: 98.5 F (36.9 C) 98.2 F (36.8 C)  101.2 F (38.4 C)  TempSrc: Oral Oral  Oral  Resp: 18 17    Height:      Weight:   132.8 kg (292 lb 12.3 oz)   SpO2: 99% 92%  95%    Intake/Output Summary (Last 24 hours) at 06/13/16 1942 Last data filed at 06/13/16 1656  Gross per 24 hour  Intake  640 ml  Output   2450 ml  Net  -1810 ml   Filed Weights   06/11/16 0650 06/12/16 2049 06/13/16 0540  Weight: 129.8 kg (286 lb 2.5 oz) 131.5 kg (289 lb 14.5 oz) 132.8 kg (292 lb 12.3 oz)    Examination:  General: Obese, A/O 4, ,No acute respiratory distress. denies CP. Eyes: negative scleral hemorrhage, negative anisocoria, negative icterus ENT: Negative Runny nose, negative gingival bleeding, fair  dentition Neck:  Negative scars, masses, torticollis, lymphadenopathy, JVD Lungs: Clear to auscultation bilaterally without wheezes or crackles; good air movement  Cardiovascular: Regular rate and rhythm without murmur gallop or rub normal S1 and S2 Abdomen: morbid obesity, negativeabdominal pain, nondistended, positive soft, bowel sounds, no rebound, no ascites, no appreciable mass Extremities: No significant cyanosis, clubbing, or edema bilateral lower extremities Skin: Negative rashes or bruises; surgical incisions on buttocks draining copious amount of yellowish serosanguineous fluid. Psychiatric:  Negative depression, negative anxiety, negative fatigue, negative mania  Central nervous system:  Cranial nerves II through XII intact, tongue/uvula midline, all extremities muscle strength 5/5, sensation intact throughout,  negative dysarthria, negative expressive aphasia, negative receptive aphasia.  Data Reviewed: Care during the described time interval was provided by me .  I have reviewed this patient's available data, including medical history, events of note, physical examination, and all test results as part of my evaluation. I have personally reviewed and interpreted all radiology studies.  CBC:  Recent Labs Lab 06/10/16 1607 06/10/16 1639 06/11/16 0501 06/12/16 0234 06/13/16 0230  WBC 12.3*  --  9.1 7.8 6.1  NEUTROABS 9.5*  --   --   --  3.8  HGB 14.0 15.6 10.3* 11.5* 10.8*  HCT 40.3 46.0 30.1* 34.1* 32.6*  MCV 91.0  --  92.6 92.9 92.9  PLT 367  --  285 313 312   Basic Metabolic Panel:  Recent Labs Lab 06/10/16 1607 06/10/16 1639 06/11/16 0501 06/11/16 0506 06/12/16 0234 06/13/16 0230  NA 130* 132* 133*  --  133* 133*  K 3.9 4.1 3.4*  --  3.7 3.5  CL 96* 94* 106  --  103 101  CO2 22  --  19*  --  25 23  GLUCOSE 110* 108* 101*  --  99 87  BUN 16 21* 12  --  6 <5*  CREATININE 1.48* 1.40* 1.00  --  0.85 0.78  CALCIUM 8.9  --  7.8*  --  7.9* 8.2*  MG  --   --   --   1.6* 1.8 1.9  PHOS  --   --   --  3.6 2.7  --    GFR: Estimated Creatinine Clearance: 130.1 mL/min (by C-G formula based on Cr of 0.78).   Liver Function Tests:  Recent Labs Lab 06/10/16 1607 06/11/16 0506 06/12/16 0234  AST 29 20  --   ALT 26 20  --   ALKPHOS 52 39  --   BILITOT 0.5 0.5  --   PROT 6.5 4.9*  --   ALBUMIN 2.6* 2.0* 2.0*   Coagulation Profile: No results for input(s): INR, PROTIME in the last 168 hours.   Cardiac Enzymes:  Recent Labs Lab 06/11/16 0506 06/11/16 1158 06/11/16 2034  TROPONINI 0.04* 0.03* 0.03*   CBG:  Recent Labs Lab 06/10/16 1605 06/10/16 2158  GLUCAP 122* 102*   Anemia Panel:  Recent Labs  06/12/16 0234  VITAMINB12 1260*  FOLATE 19.8  FERRITIN 301  TIBC 221*  IRON 28*  RETICCTPCT 2.0  Urine analysis:    Component Value Date/Time   COLORURINE YELLOW 06/10/2016 1850   APPEARANCEUR CLEAR 06/10/2016 1850   LABSPEC 1.029 06/10/2016 1850   PHURINE 6.0 06/10/2016 1850   GLUCOSEU NEGATIVE 06/10/2016 1850   HGBUR NEGATIVE 06/10/2016 1850   BILIRUBINUR NEGATIVE 06/10/2016 1850   KETONESUR NEGATIVE 06/10/2016 1850   PROTEINUR NEGATIVE 06/10/2016 1850   NITRITE NEGATIVE 06/10/2016 1850   LEUKOCYTESUR NEGATIVE 06/10/2016 1850    Recent Results (from the past 240 hour(s))  Aerobic Culture (superficial specimen)     Status: None (Preliminary result)   Collection Time: 06/10/16  4:26 PM  Result Value Ref Range Status   Specimen Description WOUND  Final   Special Requests RECTUM  Final   Gram Stain   Final    ABUNDANT WBC PRESENT, PREDOMINANTLY PMN ABUNDANT GRAM VARIABLE ROD MODERATE GRAM POSITIVE COCCI IN PAIRS    Culture CULTURE REINCUBATED FOR BETTER GROWTH  Final   Report Status PENDING  Incomplete  Blood Culture (routine x 2)     Status: None (Preliminary result)   Collection Time: 06/10/16  4:39 PM  Result Value Ref Range Status   Specimen Description BLOOD RIGHT HAND  Final   Special Requests BOTTLES DRAWN  AEROBIC AND ANAEROBIC 5CC  Final   Culture NO GROWTH 3 DAYS  Final   Report Status PENDING  Incomplete  Blood Culture (routine x 2)     Status: None (Preliminary result)   Collection Time: 06/10/16  4:56 PM  Result Value Ref Range Status   Specimen Description BLOOD RIGHT HAND  Final   Special Requests BOTTLES DRAWN AEROBIC ONLY 5CC  Final   Culture NO GROWTH 3 DAYS  Final   Report Status PENDING  Incomplete  Urine culture     Status: None   Collection Time: 06/10/16  6:50 PM  Result Value Ref Range Status   Specimen Description URINE, CLEAN CATCH  Final   Special Requests NONE  Final   Culture NO GROWTH  Final   Report Status 06/12/2016 FINAL  Final  Aerobic/Anaerobic Culture (surgical/deep wound)     Status: None (Preliminary result)   Collection Time: 06/10/16  9:18 PM  Result Value Ref Range Status   Specimen Description ABSCESS  Final   Special Requests RT GLUTEAL PT ON VANC  Final   Gram Stain   Final    RARE WBC PRESENT, PREDOMINANTLY PMN FEW GRAM POSITIVE COCCI IN PAIRS RARE GRAM VARIABLE ROD FEW SQUAMOUS EPITHELIAL CELLS PRESENT    Culture   Final    FEW VIRIDANS STREPTOCOCCUS FEW GRAM NEGATIVE RODS    Report Status PENDING  Incomplete  MRSA PCR Screening     Status: None   Collection Time: 06/10/16 11:51 PM  Result Value Ref Range Status   MRSA by PCR NEGATIVE NEGATIVE Final    Comment:        The GeneXpert MRSA Assay (FDA approved for NASAL specimens only), is one component of a comprehensive MRSA colonization surveillance program. It is not intended to diagnose MRSA infection nor to guide or monitor treatment for MRSA infections.      Radiology Studies: No results found.   Scheduled Meds: . antiseptic oral rinse  7 mL Mouth Rinse BID  . famotidine  20 mg Oral Daily  . piperacillin-tazobactam (ZOSYN)  IV  3.375 g Intravenous Q8H   Continuous Infusions: . sodium chloride 100 mL/hr at 06/12/16 1925     LOS: 3 days    Time spent: 30  minutes    Vassie Loll, MD [facility] Pager [phone]  If 7PM-7AM, please contact night-coverage [email or web address] Password [facility] 06/13/2016, 7:42 PM

## 2016-06-13 NOTE — Care Management Note (Signed)
Case Management Note  Patient Details  Name: Justin Sampson MRN: [id number] Date of Birth: [birth year]  Subjective/Objective:                    Action/Plan: Dr Gwenlyn Perking interested in having some [facility] services for the patient at home for wound care. Pt does not have insurance and so referral made to [facility]. Tiffany with [facility] to see if patient qualifies for charity services. MD informed need for orders for wound care and for [facility] services. CM will continue to follow for d/c needs.   Expected Discharge Date:  06/13/16               Expected Discharge Plan:  Home w Home Health Services  In-House Referral:     Discharge planning Services  CM Consult, [facility]  Post Acute Care Choice:    Choice offered to:     DME Arranged:    DME Agency:     HH Arranged:  RN, Social Work [employer] Agency:  [facility]  Status of Service:  In process, will continue to follow  If discussed at Long Length of Stay Meetings, dates discussed:    Additional Comments:  Kermit Balo, RN 06/13/2016, 2:37 PM

## 2016-06-13 NOTE — Progress Notes (Signed)
3 Days Post-Op  Subjective: Feeling better  Objective: Vital signs in last 24 hours: Temp:  [98.2 F (36.8 C)-99.6 F (37.6 C)] 98.2 F (36.8 C) (07/14 0411) Pulse Rate:  [86-104] 104 (07/14 0411) Resp:  [17-29] 17 (07/14 0411) BP: (73-151)/(55-91) 118/59 mmHg (07/14 0411) SpO2:  [84 %-99 %] 92 % (07/14 0411) Weight:  [131.5 kg (289 lb 14.5 oz)-132.8 kg (292 lb 12.3 oz)] 132.8 kg (292 lb 12.3 oz) (07/14 0540) Last BM Date: 06/10/16  Intake/Output from previous day: 07/13 0701 - 07/14 0700 In: 1400 [I.V.:1400] Out: 2250 [Urine:2250] Intake/Output this shift:    General appearance: alert and cooperative Incision/Wound: less erythema, penroses in place, still draining some  Lab Results:   Recent Labs  06/12/16 0234 06/13/16 0230  WBC 7.8 6.1  HGB 11.5* 10.8*  HCT 34.1* 32.6*  PLT 313 312   BMET  Recent Labs  06/12/16 0234 06/13/16 0230  NA 133* 133*  K 3.7 3.5  CL 103 101  CO2 25 23  GLUCOSE 99 87  BUN 6 <5*  CREATININE 0.85 0.78  CALCIUM 7.9* 8.2*   PT/INR No results for input(s): LABPROT, INR in the last 72 hours. ABG  Recent Labs  06/11/16 0559  PHART 7.379  HCO3 24.3*    Studies/Results: No results found.  Anti-infectives: Anti-infectives    Start     Dose/Rate Route Frequency Ordered Stop   06/11/16 1000  vancomycin (VANCOCIN) IVPB 1000 mg/200 mL premix     1,000 mg 200 mL/hr over 60 Minutes Intravenous Every 12 hours 06/11/16 0915     06/11/16 0700  vancomycin (VANCOCIN) IVPB 750 mg/150 ml premix  Status:  Discontinued     750 mg 150 mL/hr over 60 Minutes Intravenous Every 12 hours 06/10/16 1721 06/11/16 0915   06/10/16 2300  piperacillin-tazobactam (ZOSYN) IVPB 3.375 g     3.375 g 12.5 mL/hr over 240 Minutes Intravenous Every 8 hours 06/10/16 1721     06/10/16 1745  vancomycin (VANCOCIN) IVPB 1000 mg/200 mL premix     1,000 mg 200 mL/hr over 60 Minutes Intravenous  Once 06/10/16 1632 06/10/16 1839   06/10/16 1630   piperacillin-tazobactam (ZOSYN) IVPB 3.375 g     3.375 g 100 mL/hr over 30 Minutes Intravenous  Once 06/10/16 1624 06/10/16 1814   06/10/16 1630  vancomycin (VANCOCIN) IVPB 1000 mg/200 mL premix     1,000 mg 200 mL/hr over 60 Minutes Intravenous  Once 06/10/16 1624 06/10/16 1945      Assessment/Plan: s/p Procedure(s): INCISION AND DRAINAGE GLUTEAL ABSCESS (Right) POD#3 MT Change outer gauze BID Recommend change to PO ABX - Augmentin for 7 more days unless final CX directs otherwise OK for D/C from our standpoint. We will arrange F/U at [facility]   LOS: 3 days    Mckenzy Salazar E 06/13/2016

## 2016-06-14 ENCOUNTER — Encounter (HOSPITAL_COMMUNITY): Payer: Self-pay | Admitting: Internal Medicine

## 2016-06-14 DIAGNOSIS — I5021 Acute systolic (congestive) heart failure: Secondary | ICD-10-CM

## 2016-06-14 DIAGNOSIS — I472 Ventricular tachycardia: Secondary | ICD-10-CM

## 2016-06-14 DIAGNOSIS — I5020 Unspecified systolic (congestive) heart failure: Secondary | ICD-10-CM | POA: Diagnosis present

## 2016-06-14 LAB — CBC
HEMATOCRIT: 34.6 % — AB (ref 39.0–52.0)
Hemoglobin: 11.5 g/dL — ABNORMAL LOW (ref 13.0–17.0)
MCH: 30.9 pg (ref 26.0–34.0)
MCHC: 33.2 g/dL (ref 30.0–36.0)
MCV: 93 fL (ref 78.0–100.0)
PLATELETS: 331 10*3/uL (ref 150–400)
RBC: 3.72 MIL/uL — ABNORMAL LOW (ref 4.22–5.81)
RDW: 14.8 % (ref 11.5–15.5)
WBC: 6.4 10*3/uL (ref 4.0–10.5)

## 2016-06-14 LAB — AEROBIC/ANAEROBIC CULTURE W GRAM STAIN (SURGICAL/DEEP WOUND)

## 2016-06-14 LAB — AEROBIC CULTURE W GRAM STAIN (SUPERFICIAL SPECIMEN)

## 2016-06-14 LAB — LIPID PANEL
CHOLESTEROL: 109 mg/dL (ref 0–200)
HDL: 31 mg/dL — AB (ref >40)
LDL Cholesterol: 63 mg/dL (ref 0–99)
TRIGLYCERIDES: 76 mg/dL (ref <150)
Total CHOL/HDL Ratio: 3.5 RATIO
VLDL: 15 mg/dL (ref 0–40)

## 2016-06-14 LAB — BASIC METABOLIC PANEL
ANION GAP: 8 (ref 5–15)
BUN: <5 mg/dL — ABNORMAL LOW (ref 6–20)
CALCIUM: 8.5 mg/dL — AB (ref 8.9–10.3)
CO2: 29 mmol/L (ref 22–32)
Chloride: 100 mmol/L — ABNORMAL LOW (ref 101–111)
Creatinine, Ser: 0.87 mg/dL (ref 0.61–1.24)
Glucose, Bld: 101 mg/dL — ABNORMAL HIGH (ref 65–99)
Potassium: 3.2 mmol/L — ABNORMAL LOW (ref 3.5–5.1)
SODIUM: 137 mmol/L (ref 135–145)

## 2016-06-14 LAB — HEMOGLOBIN A1C
Hgb A1c MFr Bld: 5.8 % — ABNORMAL HIGH (ref 4.8–5.6)
MEAN PLASMA GLUCOSE: 120 mg/dL

## 2016-06-14 LAB — AEROBIC/ANAEROBIC CULTURE (SURGICAL/DEEP WOUND)

## 2016-06-14 LAB — AEROBIC CULTURE  (SUPERFICIAL SPECIMEN)

## 2016-06-14 LAB — MAGNESIUM: MAGNESIUM: 1.7 mg/dL (ref 1.7–2.4)

## 2016-06-14 MED ORDER — POTASSIUM CHLORIDE CRYS ER 20 MEQ PO TBCR
40.0000 meq | EXTENDED_RELEASE_TABLET | ORAL | Status: AC
  Administered 2016-06-14 (×2): 40 meq via ORAL
  Filled 2016-06-14 (×2): qty 2

## 2016-06-14 MED ORDER — CEFUROXIME AXETIL 500 MG PO TABS
500.0000 mg | ORAL_TABLET | Freq: Two times a day (BID) | ORAL | Status: DC
  Administered 2016-06-14 – 2016-06-22 (×16): 500 mg via ORAL
  Filled 2016-06-14 (×19): qty 1

## 2016-06-14 MED ORDER — CARVEDILOL 6.25 MG PO TABS
6.2500 mg | ORAL_TABLET | Freq: Two times a day (BID) | ORAL | Status: DC
  Administered 2016-06-15 – 2016-06-22 (×14): 6.25 mg via ORAL
  Filled 2016-06-14 (×7): qty 1
  Filled 2016-06-14: qty 2
  Filled 2016-06-14 (×8): qty 1

## 2016-06-14 MED ORDER — LISINOPRIL 5 MG PO TABS
5.0000 mg | ORAL_TABLET | Freq: Every day | ORAL | Status: DC
  Administered 2016-06-15 – 2016-06-22 (×8): 5 mg via ORAL
  Filled 2016-06-14 (×9): qty 1

## 2016-06-14 NOTE — Progress Notes (Signed)
Patient ID: Justin Sampson, male   DOB: 11-Aug-1956, 60 y.o.   MRN: [id number]   LOS: 4 days   Subjective: Visit conducted through family member in room. Doing well, no new c/o. Wondering when he can go home.   Objective: Vital signs in last 24 hours: Temp:  [98.8 F (37.1 C)-101.2 F (38.4 C)] 98.8 F (37.1 C) (07/15 0540) Pulse Rate:  [86-111] 86 (07/15 0540) Resp:  [18-20] 20 (07/15 0540) BP: (113-132)/(69-91) 127/75 mmHg (07/15 0540) SpO2:  [91 %-98 %] 98 % (07/15 0540) Last BM Date: 06/10/16   Laboratory  CBC  Recent Labs  06/13/16 0230 06/14/16 0549  WBC 6.1 6.4  HGB 10.8* 11.5*  HCT 32.6* 34.6*  PLT 312 331   BMET  Recent Labs  06/13/16 0230 06/14/16 0549  NA 133* 137  K 3.5 3.2*  CL 101 100*  CO2 23 29  GLUCOSE 87 101*  BUN <5* <5*  CREATININE 0.78 0.87  CALCIUM 8.2* 8.5*    Physical Exam General appearance: alert and no distress GI: Superior penrose had come out, sutures removed and penrose discarded. Otherwise looking good without significant TTP or erythema.   Assessment/Plan: s/p Procedure(s): INCISION AND DRAINAGE GLUTEAL ABSCESS (Right) POD#4 MT Change outer gauze BID Recommend change to PO ABX - Augmentin for 7 more days unless final CX directs otherwise OK for D/C from our standpoint. We will arrange F/U at [facility]    Freeman Caldron, PA-C Pager: [phone] 06/14/2016

## 2016-06-14 NOTE — Progress Notes (Signed)
PROGRESS NOTE    Justin Sampson  ION:[id number] DOB: 19-Apr-1956 DOA: 06/10/2016 PCP: No primary care provider on file.   Brief Narrative:  60 y.o. HM SPANISH SPEAKING ONLY PMHx HTN and obesity who presented to ED with "two sores on his bottom", very painful and weren't healing. In ED pt was hypotensive. ED MD noted black stool on rectal exam and started a PPI drip. CT pelvis showed gluteal abscess tracking towards the prostate with soft-tissue gas. He was taken to the OR by Dr Corliss Skains with drainage/debridement of a large abscess.      Assessment & Plan: Active Problems:   Gluteal abscess   Hypotension   Essential hypertension   Acute renal insufficiency   Melanotic stools   Sepsis (HCC)   Sepsis, unspecified organism (HCC)   Nonsustained ventricular tachycardia (HCC)   Hypokalemia   Hypomagnesemia  Sepsis due to streptococcus species  -7/11 S/P I&D and wound care per Gen Surgery -will discontinue zosyn and following cultures results start patient on ceftin -vancomycin discontinued on 7/14 -continue supportive care and increase activity   Hx of hypertension: presented with Hypotension -Resolved   -but BP soft and making difficult to start antihypertensive agents currently  -will monitor   Melena?- Normocytic anemia  -black stool according to ED MD - no clear evidence of active bleeding appreciated - follow Hgb, with current "drop" likely due to aggressive volume expansion and blood loss from during surgery; instead of GIB  -will continue pepcid   Recent Labs Lab 06/10/16 1639 06/11/16 0501 06/12/16 0234 06/13/16 0230 06/14/16 0549  HGB 15.6 10.3* 11.5* 10.8* 11.5*  -has remained Stable  Renal insuff -Resolved  With IVF's  NSVT -Resolved Maximize Mg and potassium  -Echocardiogram demonstrating EF 35-40% and some wall motion abnormalities   Hypomagnesemia -Repleted -will monitor trend  Hypokalemia -Repleted -will monitor trend  Morbid Obesity -  -Body  mass index is 46.21 kg/(m^2). -low calorie diet and exercise discussed with patient   Presumed Chronic systolic heart failure: but new diagnosis for him  -compensated -EF 35-40% -will need b-blocker and lisinopril at discharge (not started due to soft BP currently) -will also add low dose lasix at discharge for volume control -discussed low sodium diet with patient -will follow up daily weights and strict intake and output while inpatient  -given mild elevated troponin on admission and abnormal findings on 2-D echo cardiology has ben consulted and will follow rec's.  GERD -will continue famotidine  Presumed OSA -will need outpatient sleep study and most likely CPAP at bedtime  DVT prophylaxis: SCD Code Status: Full Family Communication: friend at bedside  Disposition Plan: home in the next 24-48 hours   Consultants:  Gen Surgery PCCM Cardiology   Procedures/Significant Events:  7/11 I&D large R gluteal abscess in OR  2-D Echo: - Left ventricle: The cavity size was normal. Systolic function was  moderately reduced. The estimated ejection fraction was in the  range of 35% to 40%. Hypokinesis of the lateral and inferior  myocardium. - Left atrium: The atrium was moderately dilated.   Cultures 7/11 rectal wound positive culture for strep viridans and E.coli 7/11 right gluteal abscess pending 7/11 blood right hand 2 NGTD 7/11 urine negative  Antimicrobials: Zosyn 7/11 >7/14 Vanc 7/11 >7/15 ceftin 7/15  Continuous Infusions: . sodium chloride 50 mL/hr at 06/13/16 1951    Subjective: A/O 4, in no acute distress. Denies CP and SOB. Indicating pain is overall well controlled. Patient snoring while sitting/napping on chair.  Objective:  Filed Vitals:   06/13/16 1500 06/13/16 2041 06/14/16 0540 06/14/16 1418  BP: 113/91 132/69 127/75 118/80  Pulse: 111 109 86 103  Temp: 101.2 F (38.4 C) 100.7 F (38.2 C) 98.8 F (37.1 C) 99.5 F (37.5 C)  TempSrc: Oral  Oral Oral Oral  Resp:  18 20   Height:      Weight:      SpO2: 95% 91% 98% 93%    Intake/Output Summary (Last 24 hours) at 06/14/16 1458 Last data filed at 06/14/16 1300  Gross per 24 hour  Intake    240 ml  Output   2350 ml  Net  -2110 ml   Filed Weights   06/11/16 0650 06/12/16 2049 06/13/16 0540  Weight: 129.8 kg (286 lb 2.5 oz) 131.5 kg (289 lb 14.5 oz) 132.8 kg (292 lb 12.3 oz)    Examination:  General: Obese, A/O 4, No acute respiratory distress. denies CP. Afebrile currently. Eyes: negative scleral hemorrhage, negative anisocoria, negative icterus ENT: Negative Runny nose, negative gingival bleeding, fair dentition Neck:  Negative scars, masses, torticollis, lymphadenopathy, JVD Lungs: Clear to auscultation bilaterally without wheezes or crackles; good air movement  Cardiovascular: Regular rate and rhythm without murmur gallop or rub normal S1 and S2 Abdomen: morbid obesity, negativeabdominal pain, nondistended, positive soft, bowel sounds, no rebound, no ascites, no appreciable mass Extremities: No significant cyanosis, clubbing, or edema bilateral lower extremities Skin: Negative rashes or bruises; surgical incisions on buttocks draining copious amount of yellowish serosanguineous fluid. Psychiatric:  Negative depression, negative anxiety, negative fatigue, negative mania  Central nervous system:  Cranial nerves II through XII intact, tongue/uvula midline, all extremities muscle strength 5/5, sensation intact throughout,  negative dysarthria, negative expressive aphasia, negative receptive aphasia.  Data Reviewed: Care during the described time interval was provided by me .  I have reviewed this patient's available data, including medical history, events of note, physical examination, and all test results as part of my evaluation. I have personally reviewed and interpreted all radiology studies.  CBC:  Recent Labs Lab 06/10/16 1607 06/10/16 1639 06/11/16 0501  06/12/16 0234 06/13/16 0230 06/14/16 0549  WBC 12.3*  --  9.1 7.8 6.1 6.4  NEUTROABS 9.5*  --   --   --  3.8  --   HGB 14.0 15.6 10.3* 11.5* 10.8* 11.5*  HCT 40.3 46.0 30.1* 34.1* 32.6* 34.6*  MCV 91.0  --  92.6 92.9 92.9 93.0  PLT 367  --  285 313 312 331   Basic Metabolic Panel:  Recent Labs Lab 06/10/16 1607 06/10/16 1639 06/11/16 0501 06/11/16 0506 06/12/16 0234 06/13/16 0230 06/14/16 0549  NA 130* 132* 133*  --  133* 133* 137  K 3.9 4.1 3.4*  --  3.7 3.5 3.2*  CL 96* 94* 106  --  103 101 100*  CO2 22  --  19*  --  25 23 29   GLUCOSE 110* 108* 101*  --  99 87 101*  BUN 16 21* 12  --  6 <5* <5*  CREATININE 1.48* 1.40* 1.00  --  0.85 0.78 0.87  CALCIUM 8.9  --  7.8*  --  7.9* 8.2* 8.5*  MG  --   --   --  1.6* 1.8 1.9 1.7  PHOS  --   --   --  3.6 2.7  --   --    GFR: Estimated Creatinine Clearance: 119.6 mL/min (by C-G formula based on Cr of 0.87).   Liver Function Tests:  Recent Labs Lab  06/10/16 1607 06/11/16 0506 06/12/16 0234  AST 29 20  --   ALT 26 20  --   ALKPHOS 52 39  --   BILITOT 0.5 0.5  --   PROT 6.5 4.9*  --   ALBUMIN 2.6* 2.0* 2.0*   Cardiac Enzymes:  Recent Labs Lab 06/11/16 0506 06/11/16 1158 06/11/16 2034  TROPONINI 0.04* 0.03* 0.03*   CBG:  Recent Labs Lab 06/10/16 1605 06/10/16 2158  GLUCAP 122* 102*   Anemia Panel:  Recent Labs  06/12/16 0234  VITAMINB12 1260*  FOLATE 19.8  FERRITIN 301  TIBC 221*  IRON 28*  RETICCTPCT 2.0   Urine analysis:    Component Value Date/Time   COLORURINE YELLOW 06/10/2016 1850   APPEARANCEUR CLEAR 06/10/2016 1850   LABSPEC 1.029 06/10/2016 1850   PHURINE 6.0 06/10/2016 1850   GLUCOSEU NEGATIVE 06/10/2016 1850   HGBUR NEGATIVE 06/10/2016 1850   BILIRUBINUR NEGATIVE 06/10/2016 1850   KETONESUR NEGATIVE 06/10/2016 1850   PROTEINUR NEGATIVE 06/10/2016 1850   NITRITE NEGATIVE 06/10/2016 1850   LEUKOCYTESUR NEGATIVE 06/10/2016 1850    Recent Results (from the past 240 hour(s))   Aerobic Culture (superficial specimen)     Status: None (Preliminary result)   Collection Time: 06/10/16  4:26 PM  Result Value Ref Range Status   Specimen Description WOUND  Final   Special Requests RECTUM  Final   Gram Stain   Final    ABUNDANT WBC PRESENT, PREDOMINANTLY PMN ABUNDANT GRAM VARIABLE ROD MODERATE GRAM POSITIVE COCCI IN PAIRS    Culture CULTURE REINCUBATED FOR BETTER GROWTH  Final   Report Status PENDING  Incomplete  Blood Culture (routine x 2)     Status: None (Preliminary result)   Collection Time: 06/10/16  4:39 PM  Result Value Ref Range Status   Specimen Description BLOOD RIGHT HAND  Final   Special Requests BOTTLES DRAWN AEROBIC AND ANAEROBIC 5CC  Final   Culture NO GROWTH 3 DAYS  Final   Report Status PENDING  Incomplete  Blood Culture (routine x 2)     Status: None (Preliminary result)   Collection Time: 06/10/16  4:56 PM  Result Value Ref Range Status   Specimen Description BLOOD RIGHT HAND  Final   Special Requests BOTTLES DRAWN AEROBIC ONLY 5CC  Final   Culture NO GROWTH 3 DAYS  Final   Report Status PENDING  Incomplete  Urine culture     Status: None   Collection Time: 06/10/16  6:50 PM  Result Value Ref Range Status   Specimen Description URINE, CLEAN CATCH  Final   Special Requests NONE  Final   Culture NO GROWTH  Final   Report Status 06/12/2016 FINAL  Final  Aerobic/Anaerobic Culture (surgical/deep wound)     Status: None (Preliminary result)   Collection Time: 06/10/16  9:18 PM  Result Value Ref Range Status   Specimen Description ABSCESS  Final   Special Requests RT GLUTEAL PT ON VANC  Final   Gram Stain   Final    RARE WBC PRESENT, PREDOMINANTLY PMN FEW GRAM POSITIVE COCCI IN PAIRS RARE GRAM VARIABLE ROD FEW SQUAMOUS EPITHELIAL CELLS PRESENT    Culture   Final    MODERATE VIRIDANS STREPTOCOCCUS FEW ESCHERICHIA COLI    Report Status PENDING  Incomplete   Organism ID, Bacteria ESCHERICHIA COLI  Final      Susceptibility    Escherichia coli - MIC*    AMPICILLIN >=32 RESISTANT Resistant     CEFAZOLIN <=4 SENSITIVE Sensitive  CEFEPIME <=1 SENSITIVE Sensitive     CEFTAZIDIME <=1 SENSITIVE Sensitive     CEFTRIAXONE <=1 SENSITIVE Sensitive     CIPROFLOXACIN <=0.25 SENSITIVE Sensitive     GENTAMICIN <=1 SENSITIVE Sensitive     IMIPENEM <=0.25 SENSITIVE Sensitive     TRIMETH/SULFA <=20 SENSITIVE Sensitive     AMPICILLIN/SULBACTAM 16 INTERMEDIATE Intermediate     PIP/TAZO <=4 SENSITIVE Sensitive     * FEW ESCHERICHIA COLI  MRSA PCR Screening     Status: None   Collection Time: 06/10/16 11:51 PM  Result Value Ref Range Status   MRSA by PCR NEGATIVE NEGATIVE Final    Comment:        The GeneXpert MRSA Assay (FDA approved for NASAL specimens only), is one component of a comprehensive MRSA colonization surveillance program. It is not intended to diagnose MRSA infection nor to guide or monitor treatment for MRSA infections.      Radiology Studies: No results found.   Scheduled Meds: . antiseptic oral rinse  7 mL Mouth Rinse BID  . cefUROXime  500 mg Oral BID WC  . famotidine  20 mg Oral Daily   Continuous Infusions: . sodium chloride 50 mL/hr at 06/13/16 1951     LOS: 4 days    Time spent: 30 minutes    Vassie Loll, MD [facility] Pager [phone]  If 7PM-7AM, please contact night-coverage [email or web address] Password [facility] 06/14/2016, 2:58 PM

## 2016-06-14 NOTE — Consult Note (Signed)
CARDIOLOGY CONSULT NOTE     Referring Physician:  Dr Gwenlyn Perking  Admit Date: 06/10/2016  Reason for consultation:  Reduced EF  Justin Sampson is a 60 y.o. male with a h/o hypertension admitted with gluteal abscess requiring surgery.  Pt is spanish speaking and so history is primarily from epic.  He was quite ill upon admission with dehydration, acute renal failure, and lactic acidosis.  In this condition, he was observed to have a seizure vs syncope.  He was admitted and required aggressive hydration, antibiotics and surgical I&D.  He has had no further seizure/ syncope but did have NSVT for which he had an echo obtained.   This reveals EF 35-40% with lateral and inferior HK.  Moderate LA enlargement was also noted.  He is no longer on telemetry.   He denies chest pain but reports SOB at rest and with exertion.  He states that he has had progressive SOB and edema over the past year.  Past Medical History  Diagnosis Date  . Hypertension   . Snoring   . Obesities, morbid [facility])    Past Surgical History  Procedure Laterality Date  . Incision and drainage abscess Right 06/10/2016    Procedure: INCISION AND DRAINAGE GLUTEAL ABSCESS;  Surgeon: Manus Rudd, MD;  Location: [facility] OR;  Service: General;  Laterality: Right;    . antiseptic oral rinse  7 mL Mouth Rinse BID  . cefUROXime  500 mg Oral BID WC  . famotidine  20 mg Oral Daily  . potassium chloride  40 mEq Oral Q4H   . sodium chloride 50 mL/hr at 06/13/16 1951    No Known Allergies  Social History   Social History  . Marital Status: Single    Spouse Name: N/A  . Number of Children: N/A  . Years of Education: N/A   Occupational History  . Not on file.   Social History Main Topics  . Smoking status: Never Smoker   . Smokeless tobacco: Not on file  . Alcohol Use: No  . Drug Use: No  . Sexual Activity: Not on file   Other Topics Concern  . Not on file   Social History Narrative   FH- non english speaking and unable to  obtain  ROS- non english speaking and unable to obtain  Physical Exam: Telemetry: not currently on telemetry, tele strips scanned into epic reveal 5 beat run of NSVT Filed Vitals:   06/13/16 1500 06/13/16 2041 06/14/16 0540 06/14/16 1418  BP: 113/91 132/69 127/75 118/80  Pulse: 111 109 86 103  Temp: 101.2 F (38.4 C) 100.7 F (38.2 C) 98.8 F (37.1 C) 99.5 F (37.5 C)  TempSrc: Oral Oral Oral Oral  Resp:  18 20   Height:      Weight:      SpO2: 95% 91% 98% 93%    GEN- The patient is obese appearing, alert  Head- normocephalic, atraumatic Eyes-  Sclera clear, conjunctiva pink Ears- hearing intact Oropharynx- clear Neck- supple,   Lungs- decreased BS, appears dypsneic Heart- Regular rate and rhythm, no murmurs, rubs or gallops, PMI not laterally displaced GI- soft, NT, ND, + BS Extremities- no clubbing, cyanosis, +2 edema MS- no significant deformity or atrophy Skin- no rash or lesion Psych- euthymic mood, full affect Neuro- strength and sensation are intact  EKG: reveals sinus rhythm 85 bpm, PVC, nonspecific ST/T changes  Labs:   Lab Results  Component Value Date   WBC 6.4 06/14/2016   HGB 11.5* 06/14/2016  HCT 34.6* 06/14/2016   MCV 93.0 06/14/2016   PLT 331 06/14/2016    Recent Labs Lab 06/11/16 0506  06/14/16 0549  NA  --   < > 137  K  --   < > 3.2*  CL  --   < > 100*  CO2  --   < > 29  BUN  --   < > <5*  CREATININE  --   < > 0.87  CALCIUM  --   < > 8.5*  PROT 4.9*  --   --   BILITOT 0.5  --   --   ALKPHOS 39  --   --   ALT 20  --   --   AST 20  --   --   GLUCOSE  --   < > 101*  < > = values in this interval not displayed. Lab Results  Component Value Date   TROPONINI 0.03* 06/11/2016    Lab Results  Component Value Date   CHOL 109 06/14/2016   Lab Results  Component Value Date   HDL 31* 06/14/2016   Lab Results  Component Value Date   LDLCALC 63 06/14/2016   Lab Results  Component Value Date   TRIG 76 06/14/2016   Lab Results   Component Value Date   CHOLHDL 3.5 06/14/2016     Echo: as above  ASSESSMENT AND PLAN:   1. Acute systolic dysfunction The patient has symptoms of CHF which appear to have been progressive over the past year.  He has SOB at rest and with exertion.  He has NSVT on telemetry and is found to have reduced EF.  I worry that he is at high risk for ischemic cause.  I would therefore favor LHC with possible PCI during this admission.  I will place on cath schedule for Monday. Will add coreg and lisinopril and titrate while here. Will need to discuss possible need for antiplatelet therapy with surgical team given recent I&D.  Would not start ASA until we receive surgical clearance.  2. Morbid obesity Body mass index is 47.28 kg/(m^2). Weight loss is advised   Hillis Range, MD 06/14/2016  3:17 PM

## 2016-06-15 LAB — CULTURE, BLOOD (ROUTINE X 2)
CULTURE: NO GROWTH
Culture: NO GROWTH

## 2016-06-15 LAB — MAGNESIUM: Magnesium: 1.7 mg/dL (ref 1.7–2.4)

## 2016-06-15 MED ORDER — SODIUM CHLORIDE 0.9% FLUSH
3.0000 mL | Freq: Two times a day (BID) | INTRAVENOUS | Status: DC
  Administered 2016-06-15: 3 mL via INTRAVENOUS

## 2016-06-15 MED ORDER — SODIUM CHLORIDE 0.9 % WEIGHT BASED INFUSION
1.0000 mL/kg/h | INTRAVENOUS | Status: DC
  Administered 2016-06-16: 1 mL/kg/h via INTRAVENOUS

## 2016-06-15 MED ORDER — SODIUM CHLORIDE 0.9 % IV SOLN: 250.0000 mL | INTRAVENOUS | Status: DC | PRN

## 2016-06-15 MED ORDER — ASPIRIN 81 MG PO CHEW
81.0000 mg | CHEWABLE_TABLET | ORAL | Status: AC
  Administered 2016-06-16: 81 mg via ORAL
  Filled 2016-06-15: qty 1

## 2016-06-15 MED ORDER — SODIUM CHLORIDE 0.9 % WEIGHT BASED INFUSION: 3.0000 mL/kg/h | INTRAVENOUS | Status: DC

## 2016-06-15 MED ORDER — SODIUM CHLORIDE 0.9% FLUSH: 3.0000 mL | INTRAVENOUS | Status: DC | PRN

## 2016-06-15 MED ORDER — FUROSEMIDE 10 MG/ML IJ SOLN
20.0000 mg | Freq: Once | INTRAMUSCULAR | Status: AC
  Administered 2016-06-15: 20 mg via INTRAVENOUS
  Filled 2016-06-15: qty 2

## 2016-06-15 NOTE — Progress Notes (Signed)
   SUBJECTIVE: The patient is doing well today.  + some SOB.   At this time, he denies chest pain or any new concerns.  . antiseptic oral rinse  7 mL Mouth Rinse BID  . carvedilol  6.25 mg Oral BID WC  . cefUROXime  500 mg Oral BID WC  . famotidine  20 mg Oral Daily  . lisinopril  5 mg Oral Daily   . sodium chloride 20 mL/hr at 06/14/16 1605    OBJECTIVE: Physical Exam: Filed Vitals:   06/15/16 0805 06/15/16 1001 06/15/16 1007 06/15/16 1010  BP:  108/77    Pulse:      Temp:      TempSrc:      Resp:      Height:      Weight:      SpO2: 92%  84% 96%    Intake/Output Summary (Last 24 hours) at 06/15/16 1303 Last data filed at 06/14/16 1600  Gross per 24 hour  Intake      0 ml  Output    400 ml  Net   -400 ml   GEN- The patient is overweight appearing, alert  Head- normocephalic, atraumatic Eyes-  Sclera clear, conjunctiva pink Ears- hearing intact Oropharynx- clear Neck- supple,  Lungs- Clear to ausculation bilaterally, normal work of breathing Heart- Regular rate and rhythm  GI- soft, NT, ND, + BS Extremities- no clubbing, cyanosis, + edema Skin- no rash or lesion Psych- euthymic mood, full affect Neuro- strength and sensation are intact  LABS: Basic Metabolic Panel:  Recent Labs  06/13/16 0230 06/14/16 0549 06/15/16 0308  NA 133* 137  --   K 3.5 3.2*  --   CL 101 100*  --   CO2 23 29  --   GLUCOSE 87 101*  --   BUN <5* <5*  --   CREATININE 0.78 0.87  --   CALCIUM 8.2* 8.5*  --   MG 1.9 1.7 1.7   CBC:  Recent Labs  06/13/16 0230 06/14/16 0549  WBC 6.1 6.4  NEUTROABS 3.8  --   HGB 10.8* 11.5*  HCT 32.6* 34.6*  MCV 92.9 93.0  PLT 312 331   Hemoglobin A1C:  Recent Labs  06/13/16 1300  HGBA1C 5.8*   Fasting Lipid Panel:  Recent Labs  06/14/16 0549  CHOL 109  HDL 31*  LDLCALC 63  TRIG 76  CHOLHDL 3.5   Thyroid Function Tests:  Recent Labs  06/13/16 2013  TSH 0.581    ASSESSMENT AND PLAN:    1. Acute systolic  dysfunction The patient has symptoms of CHF which appear to have been progressive over the past year. He has SOB at rest and with exertion. He has NSVT on telemetry and is found to have reduced EF. I worry that he is at high risk for ischemic cause. I would therefore favor LHC with possible PCI during this admission. I will place on cath schedule for Monday.  Orders placed.  NPO after midnight.  Will arrange for spanish interpretor for cath.  Dr Madera has spoken in detail with patient about cath and patient wishes to proceed.  2. Morbid obesity Body mass index is 47.28 kg/(m^2). Weight loss is advised   Artis Beggs, MD 06/15/2016 1:03 PM  

## 2016-06-15 NOTE — Progress Notes (Signed)
Patient ID: Justin Sampson, male   DOB: 1956-10-22, 60 y.o.   MRN: [id number]   LOS: 5 days   Subjective: No new c/o.   Objective: Vital signs in last 24 hours: Temp:  [98.2 F (36.8 C)-99.5 F (37.5 C)] 98.2 F (36.8 C) (07/16 0428) Pulse Rate:  [93-104] 93 (07/16 0800) BP: (100-136)/(46-92) 108/77 mmHg (07/16 1001) SpO2:  [84 %-96 %] 96 % (07/16 1010) Weight:  [130.1 kg (286 lb 13.1 oz)] 130.1 kg (286 lb 13.1 oz) (07/15 2345) Last BM Date: 06/10/16   Laboratory  CBC  Recent Labs  06/13/16 0230 06/14/16 0549  WBC 6.1 6.4  HGB 10.8* 11.5*  HCT 32.6* 34.6*  PLT 312 331   BMET  Recent Labs  06/13/16 0230 06/14/16 0549  NA 133* 137  K 3.5 3.2*  CL 101 100*  CO2 23 29  GLUCOSE 87 101*  BUN <5* <5*  CREATININE 0.78 0.87  CALCIUM 8.2* 8.5*    Physical Exam General appearance: alert and no distress GI: Wound clean, penrose in place   Assessment/Plan: s/p Procedure(s): INCISION AND DRAINAGE GLUTEAL ABSCESS (Right) POD#5 MT Change outer gauze BID Recommend change to PO ABX - Augmentin for 7 days OK for D/C from our standpoint. We will arrange F/U at [facility]    Freeman Caldron, PA-C Pager: [phone] General Trauma PA Pager: [phone]  06/15/2016

## 2016-06-15 NOTE — Progress Notes (Signed)
PROGRESS NOTE    Justin Sampson  RUE:[id number] DOB: 1956-05-08 DOA: 06/10/2016 PCP: No primary care provider on file.   Brief Narrative:  60 y.o. HM SPANISH SPEAKING ONLY PMHx HTN and obesity who presented to ED with "two sores on his bottom", very painful and weren't healing. In ED pt was hypotensive. ED MD noted black stool on rectal exam and started a PPI drip. CT pelvis showed gluteal abscess tracking towards the prostate with soft-tissue gas. He was taken to the OR by Dr Corliss Skains with drainage/debridement of a large abscess.      Assessment & Plan: Active Problems:   Gluteal abscess   Hypotension   Essential hypertension   Acute renal insufficiency   Melanotic stools   Sepsis (HCC)   Sepsis, unspecified organism (HCC)   Nonsustained ventricular tachycardia (HCC)   Hypokalemia   Hypomagnesemia   Systolic congestive heart failure (HCC)  Sepsis due to streptococcus species  -7/11 S/P I&D and wound care per Gen Surgery -will discontinue zosyn and following cultures results start patient on ceftin (with plans to treat for 10 days) -vancomycin discontinued on 7/14 -continue supportive care and increase activity   Hx of hypertension: presented with Hypotension -Resolved   -BP is now stable (and tolerating low dose lisinopril and coreg) -will continue current antihypertensive agents   Melena?- Normocytic anemia  -black stool according to ED MD - no clear evidence of active bleeding appreciated - follow Hgb, with current "drop" likely due to aggressive volume expansion and blood loss from during surgery; instead of GIB  -will continue pepcid   Recent Labs Lab 06/10/16 1639 06/11/16 0501 06/12/16 0234 06/13/16 0230 06/14/16 0549  HGB 15.6 10.3* 11.5* 10.8* 11.5*  -has remained Stable  Renal insuff -Resolved  With IVF's  NSVT -Resolved  -will Maximize Mg and potassium  -Echocardiogram demonstrating EF 35-40% and some wall motion abnormalities    Hypomagnesemia -Repleted -will monitor trend  Hypokalemia -Repleted -will monitor trend  Morbid Obesity -  -Body mass index is 46.21 kg/(m^2). -low calorie diet and exercise discussed with patient   Presumed Chronic systolic heart failure: but new diagnosis for him  -compensated -EF 35-40% -started on low dose coreg and lisinopril -cardiology planning heart cath on 7/17 -will also add low dose lasix at discharge for volume control -discussed low sodium diet with patient -will follow up daily weights and strict intake and output while inpatient   GERD -will continue famotidine  Presumed OSA -will need outpatient sleep study and most likely CPAP at bedtime  DVT prophylaxis: SCD Code Status: Full Family Communication: friend at bedside  Disposition Plan: home in the next 24-48 hours   Consultants:  Gen Surgery PCCM Cardiology   Procedures/Significant Events:  7/11 I&D large R gluteal abscess in OR   2-D Echo: - Left ventricle: The cavity size was normal. Systolic function was  moderately reduced. The estimated ejection fraction was in the  range of 35% to 40%. Hypokinesis of the lateral and inferior  myocardium. - Left atrium: The atrium was moderately dilated.  Left Heart cath: planned for 7/17   Cultures 7/11 rectal wound positive culture for strep viridans and E.coli 7/11 right gluteal abscess pending 7/11 blood right hand 2 NGTD 7/11 urine negative  Antimicrobials: Zosyn 7/11 >7/14 Vanc 7/11 >7/15 ceftin 7/15  Continuous Infusions: . sodium chloride 20 mL/hr at 06/14/16 1605    Subjective: A/O 4, in no acute distress. Denies CP. Some mild SOB with minimal activity endorsed. Wound pain is overall  well controlled. Patient with mild hypoxia when he fall sleep, using 2L [place].  Objective: Filed Vitals:   06/15/16 1001 06/15/16 1007 06/15/16 1010 06/15/16 1557  BP: 108/77   122/82  Pulse:      Temp:    98.4 F (36.9 C)  TempSrc:    Oral   Resp:    20  Height:      Weight:      SpO2:  84% 96% 96%   No intake or output data in the 24 hours ending 06/15/16 1654 Filed Weights   06/12/16 2049 06/13/16 0540 06/14/16 2345  Weight: 131.5 kg (289 lb 14.5 oz) 132.8 kg (292 lb 12.3 oz) 130.1 kg (286 lb 13.1 oz)    Examination:  General: Obese, A/O 4, No acute respiratory distress. denies CP. Afebrile currently. Eyes: negative scleral hemorrhage, negative anisocoria, negative icterus ENT: Negative Runny nose, negative gingival bleeding, fair dentition Neck:  Negative scars, masses, torticollis, lymphadenopathy, JVD Lungs: Clear to auscultation bilaterally without wheezes or crackles; good air movement  Cardiovascular: Regular rate and rhythm without murmur gallop or rub normal S1 and S2 Abdomen: morbid obesity, negativeabdominal pain, nondistended, positive soft, bowel sounds, no rebound, no ascites, no appreciable mass Extremities: No significant cyanosis, clubbing, or edema bilateral lower extremities Skin: Negative rashes or bruises; surgical incisions on buttocks draining copious amount of yellowish serosanguineous fluid. Psychiatric:  Negative depression, negative anxiety, negative fatigue, negative mania  Central nervous system:  Cranial nerves II through XII intact, tongue/uvula midline, all extremities muscle strength 5/5, sensation intact throughout,  negative dysarthria, negative expressive aphasia, negative receptive aphasia.  Data Reviewed: Care during the described time interval was provided by me .  I have reviewed this patient's available data, including medical history, events of note, physical examination, and all test results as part of my evaluation. I have personally reviewed and interpreted all radiology studies.  CBC:  Recent Labs Lab 06/10/16 1607 06/10/16 1639 06/11/16 0501 06/12/16 0234 06/13/16 0230 06/14/16 0549  WBC 12.3*  --  9.1 7.8 6.1 6.4  NEUTROABS 9.5*  --   --   --  3.8  --   HGB 14.0  15.6 10.3* 11.5* 10.8* 11.5*  HCT 40.3 46.0 30.1* 34.1* 32.6* 34.6*  MCV 91.0  --  92.6 92.9 92.9 93.0  PLT 367  --  285 313 312 331   Basic Metabolic Panel:  Recent Labs Lab 06/10/16 1607 06/10/16 1639 06/11/16 0501 06/11/16 0506 06/12/16 0234 06/13/16 0230 06/14/16 0549 06/15/16 0308  NA 130* 132* 133*  --  133* 133* 137  --   K 3.9 4.1 3.4*  --  3.7 3.5 3.2*  --   CL 96* 94* 106  --  103 101 100*  --   CO2 22  --  19*  --  --   GLUCOSE 110* 108* 101*  --  99 87 101*  --   BUN 16 21* 12  --  6 <5* <5*  --   CREATININE 1.48* 1.40* 1.00  --  0.85 0.78 0.87  --   CALCIUM 8.9  --  7.8*  --  7.9* 8.2* 8.5*  --   MG  --   --   --  1.6* 1.8 1.9 1.7 1.7  PHOS  --   --   --  3.6 2.7  --   --   --    GFR: Estimated Creatinine Clearance: 118.2 mL/min (by C-G formula based on Cr of 0.87).  Liver Function Tests:  Recent Labs Lab 06/10/16 1607 06/11/16 0506 06/12/16 0234  AST 29 20  --   ALT 26 20  --   ALKPHOS 52 39  --   BILITOT 0.5 0.5  --   PROT 6.5 4.9*  --   ALBUMIN 2.6* 2.0* 2.0*   Cardiac Enzymes:  Recent Labs Lab 06/11/16 0506 06/11/16 1158 06/11/16 2034  TROPONINI 0.04* 0.03* 0.03*   CBG:  Recent Labs Lab 06/10/16 1605 06/10/16 2158  GLUCAP 122* 102*   Anemia Panel: No results for input(s): VITAMINB12, FOLATE, FERRITIN, TIBC, IRON, RETICCTPCT in the last 72 hours.   Urine analysis:    Component Value Date/Time   COLORURINE YELLOW 06/10/2016 1850   APPEARANCEUR CLEAR 06/10/2016 1850   LABSPEC 1.029 06/10/2016 1850   PHURINE 6.0 06/10/2016 1850   GLUCOSEU NEGATIVE 06/10/2016 1850   HGBUR NEGATIVE 06/10/2016 1850   BILIRUBINUR NEGATIVE 06/10/2016 1850   KETONESUR NEGATIVE 06/10/2016 1850   PROTEINUR NEGATIVE 06/10/2016 1850   NITRITE NEGATIVE 06/10/2016 1850   LEUKOCYTESUR NEGATIVE 06/10/2016 1850    Recent Results (from the past 240 hour(s))  Aerobic Culture (superficial specimen)     Status: None   Collection Time: 06/10/16   4:26 PM  Result Value Ref Range Status   Specimen Description WOUND  Final   Special Requests RECTUM  Final   Gram Stain   Final    ABUNDANT WBC PRESENT, PREDOMINANTLY PMN ABUNDANT GRAM VARIABLE ROD MODERATE GRAM POSITIVE COCCI IN PAIRS    Culture   Final    RARE ESCHERICHIA COLI SUSCEPTIBILITIES PERFORMED ON PREVIOUS CULTURE WITHIN THE LAST 5 DAYS. FEW VIRIDANS STREPTOCOCCUS    Report Status 06/14/2016 FINAL  Final  Blood Culture (routine x 2)     Status: None   Collection Time: 06/10/16  4:39 PM  Result Value Ref Range Status   Specimen Description BLOOD RIGHT HAND  Final   Special Requests BOTTLES DRAWN AEROBIC AND ANAEROBIC 5CC  Final   Culture NO GROWTH 5 DAYS  Final   Report Status 06/15/2016 FINAL  Final  Blood Culture (routine x 2)     Status: None   Collection Time: 06/10/16  4:56 PM  Result Value Ref Range Status   Specimen Description BLOOD RIGHT HAND  Final   Special Requests BOTTLES DRAWN AEROBIC ONLY 5CC  Final   Culture NO GROWTH 5 DAYS  Final   Report Status 06/15/2016 FINAL  Final  Urine culture     Status: None   Collection Time: 06/10/16  6:50 PM  Result Value Ref Range Status   Specimen Description URINE, CLEAN CATCH  Final   Special Requests NONE  Final   Culture NO GROWTH  Final   Report Status 06/12/2016 FINAL  Final  Aerobic/Anaerobic Culture (surgical/deep wound)     Status: None   Collection Time: 06/10/16  9:18 PM  Result Value Ref Range Status   Specimen Description ABSCESS  Final   Special Requests RT GLUTEAL PT ON VANC  Final   Gram Stain   Final    RARE WBC PRESENT, PREDOMINANTLY PMN FEW GRAM POSITIVE COCCI IN PAIRS RARE GRAM VARIABLE ROD FEW SQUAMOUS EPITHELIAL CELLS PRESENT    Culture   Final    MODERATE VIRIDANS STREPTOCOCCUS FEW ESCHERICHIA COLI MIXED ANAEROBIC FLORA PRESENT.  CALL LAB IF FURTHER IID REQUIRED.    Report Status 06/14/2016 FINAL  Final   Organism ID, Bacteria ESCHERICHIA COLI  Final      Susceptibility  Escherichia coli - MIC*    AMPICILLIN >=32 RESISTANT Resistant     CEFAZOLIN <=4 SENSITIVE Sensitive     CEFEPIME <=1 SENSITIVE Sensitive     CEFTAZIDIME <=1 SENSITIVE Sensitive     CEFTRIAXONE <=1 SENSITIVE Sensitive     CIPROFLOXACIN <=0.25 SENSITIVE Sensitive     GENTAMICIN <=1 SENSITIVE Sensitive     IMIPENEM <=0.25 SENSITIVE Sensitive     TRIMETH/SULFA <=20 SENSITIVE Sensitive     AMPICILLIN/SULBACTAM 16 INTERMEDIATE Intermediate     PIP/TAZO <=4 SENSITIVE Sensitive     * FEW ESCHERICHIA COLI  MRSA PCR Screening     Status: None   Collection Time: 06/10/16 11:51 PM  Result Value Ref Range Status   MRSA by PCR NEGATIVE NEGATIVE Final    Comment:        The GeneXpert MRSA Assay (FDA approved for NASAL specimens only), is one component of a comprehensive MRSA colonization surveillance program. It is not intended to diagnose MRSA infection nor to guide or monitor treatment for MRSA infections.      Radiology Studies: No results found.   Scheduled Meds: . antiseptic oral rinse  7 mL Mouth Rinse BID  . carvedilol  6.25 mg Oral BID WC  . cefUROXime  500 mg Oral BID WC  . famotidine  20 mg Oral Daily  . lisinopril  5 mg Oral Daily   Continuous Infusions: . sodium chloride 20 mL/hr at 06/14/16 1605     LOS: 5 days    Time spent: 30 minutes    Vassie Loll, MD [facility] Pager [phone]  If 7PM-7AM, please contact night-coverage [email or web address] Password [facility] 06/15/2016, 4:54 PM

## 2016-06-15 NOTE — Progress Notes (Signed)
Messaged Dr. Gwenlyn Perking about patient's oxygen levels.

## 2016-06-15 NOTE — Progress Notes (Signed)
Informed Daniel the night nurse to release orders for pre procedure cardiac cath.

## 2016-06-16 ENCOUNTER — Encounter (HOSPITAL_COMMUNITY)
Admission: EM | Disposition: A | Discharge status: Home Health | Payer: Self-pay | Source: Home / Self Care | Attending: Internal Medicine

## 2016-06-16 ENCOUNTER — Encounter (HOSPITAL_COMMUNITY): Payer: Self-pay | Admitting: Interventional Cardiology

## 2016-06-16 HISTORY — PX: CARDIAC CATHETERIZATION: SHX172

## 2016-06-16 LAB — BASIC METABOLIC PANEL
ANION GAP: 7 (ref 5–15)
CALCIUM: 8.8 mg/dL — AB (ref 8.9–10.3)
CO2: 28 mmol/L (ref 22–32)
Chloride: 101 mmol/L (ref 101–111)
Creatinine, Ser: 0.71 mg/dL (ref 0.61–1.24)
GFR calc Af Amer: >60 mL/min (ref >60)
Glucose, Bld: 92 mg/dL (ref 65–99)
POTASSIUM: 3.6 mmol/L (ref 3.5–5.1)
SODIUM: 136 mmol/L (ref 135–145)

## 2016-06-16 LAB — CBC
HCT: 34.9 % — ABNORMAL LOW (ref 39.0–52.0)
Hemoglobin: 11.5 g/dL — ABNORMAL LOW (ref 13.0–17.0)
MCH: 30.7 pg (ref 26.0–34.0)
MCHC: 33 g/dL (ref 30.0–36.0)
MCV: 93.3 fL (ref 78.0–100.0)
Platelets: 399 10*3/uL (ref 150–400)
RBC: 3.74 MIL/uL — ABNORMAL LOW (ref 4.22–5.81)
RDW: 15 % (ref 11.5–15.5)
WBC: 6 10*3/uL (ref 4.0–10.5)

## 2016-06-16 LAB — PROTIME-INR
INR: 1.7 — AB (ref 0.00–1.49)
PROTHROMBIN TIME: 19.9 s — AB (ref 11.6–15.2)

## 2016-06-16 SURGERY — LEFT HEART CATH AND CORONARY ANGIOGRAPHY
Anesthesia: LOCAL

## 2016-06-16 MED ORDER — IOPAMIDOL (ISOVUE-370) INJECTION 76%
INTRAVENOUS | Status: DC | PRN
  Administered 2016-06-16: 70 mL via INTRAVENOUS

## 2016-06-16 MED ORDER — HEPARIN (PORCINE) IN NACL 2-0.9 UNIT/ML-% IJ SOLN
INTRAMUSCULAR | Status: DC | PRN
  Administered 2016-06-16: 10:00:00

## 2016-06-16 MED ORDER — LIDOCAINE HCL (PF) 1 % IJ SOLN
INTRAMUSCULAR | Status: DC | PRN
  Administered 2016-06-16: 5 mL

## 2016-06-16 MED ORDER — SODIUM CHLORIDE 0.9% FLUSH
3.0000 mL | INTRAVENOUS | Status: DC | PRN
  Administered 2016-06-18: 3 mL via INTRAVENOUS
  Filled 2016-06-16: qty 3

## 2016-06-16 MED ORDER — ONDANSETRON HCL 4 MG/2ML IJ SOLN: 4.0000 mg | Freq: Four times a day (QID) | INTRAMUSCULAR | Status: DC | PRN

## 2016-06-16 MED ORDER — IOPAMIDOL (ISOVUE-370) INJECTION 76%
INTRAVENOUS | Status: AC
  Filled 2016-06-16: qty 100

## 2016-06-16 MED ORDER — HEPARIN SODIUM (PORCINE) 1000 UNIT/ML IJ SOLN
INTRAMUSCULAR | Status: AC
  Filled 2016-06-16: qty 1

## 2016-06-16 MED ORDER — HEPARIN (PORCINE) IN NACL 2-0.9 UNIT/ML-% IJ SOLN
INTRAMUSCULAR | Status: AC
  Filled 2016-06-16: qty 1500

## 2016-06-16 MED ORDER — MIDAZOLAM HCL 2 MG/2ML IJ SOLN
INTRAMUSCULAR | Status: AC
  Filled 2016-06-16: qty 2

## 2016-06-16 MED ORDER — SODIUM CHLORIDE 0.9% FLUSH
3.0000 mL | Freq: Two times a day (BID) | INTRAVENOUS | Status: DC
  Administered 2016-06-16 – 2016-06-21 (×6): 3 mL via INTRAVENOUS

## 2016-06-16 MED ORDER — FENTANYL CITRATE (PF) 100 MCG/2ML IJ SOLN
INTRAMUSCULAR | Status: AC
  Filled 2016-06-16: qty 2

## 2016-06-16 MED ORDER — VERAPAMIL HCL 2.5 MG/ML IV SOLN
INTRAVENOUS | Status: DC | PRN
  Administered 2016-06-16: 10:00:00 via INTRA_ARTERIAL

## 2016-06-16 MED ORDER — SODIUM CHLORIDE 0.9% FLUSH
3.0000 mL | Freq: Two times a day (BID) | INTRAVENOUS | Status: DC
  Administered 2016-06-16 – 2016-06-21 (×4): 3 mL via INTRAVENOUS

## 2016-06-16 MED ORDER — ACETAMINOPHEN 325 MG PO TABS: 650.0000 mg | ORAL_TABLET | ORAL | Status: DC | PRN

## 2016-06-16 MED ORDER — SODIUM CHLORIDE 0.9 % IV SOLN: 250.0000 mL | INTRAVENOUS | Status: DC | PRN

## 2016-06-16 MED ORDER — LIDOCAINE HCL (PF) 1 % IJ SOLN
INTRAMUSCULAR | Status: AC
  Filled 2016-06-16: qty 30

## 2016-06-16 MED ORDER — HEPARIN SODIUM (PORCINE) 1000 UNIT/ML IJ SOLN
INTRAMUSCULAR | Status: DC | PRN
  Administered 2016-06-16: 6000 [IU] via INTRAVENOUS

## 2016-06-16 MED ORDER — MIDAZOLAM HCL 2 MG/2ML IJ SOLN
INTRAMUSCULAR | Status: DC | PRN
  Administered 2016-06-16: 1 mg via INTRAVENOUS

## 2016-06-16 MED ORDER — FENTANYL CITRATE (PF) 100 MCG/2ML IJ SOLN
INTRAMUSCULAR | Status: DC | PRN
  Administered 2016-06-16: 25 ug via INTRAVENOUS

## 2016-06-16 MED ORDER — VERAPAMIL HCL 2.5 MG/ML IV SOLN
INTRAVENOUS | Status: AC
  Filled 2016-06-16: qty 2

## 2016-06-16 MED ORDER — HEPARIN SODIUM (PORCINE) 5000 UNIT/ML IJ SOLN
5000.0000 [IU] | Freq: Three times a day (TID) | INTRAMUSCULAR | Status: DC
Start: 2016-06-16 — End: 2016-06-19
  Administered 2016-06-16 – 2016-06-19 (×7): 5000 [IU] via SUBCUTANEOUS
  Filled 2016-06-16 (×8): qty 1

## 2016-06-16 MED ORDER — SODIUM CHLORIDE 0.9% FLUSH: 3.0000 mL | INTRAVENOUS | Status: DC | PRN

## 2016-06-16 SURGICAL SUPPLY — 12 items
CATH INFINITI 5 FR JL3.5 (CATHETERS) ×2 IMPLANT
CATH INFINITI 5FR ANG PIGTAIL (CATHETERS) ×2 IMPLANT
CATH INFINITI JR4 5F (CATHETERS) ×2 IMPLANT
DEVICE RAD COMP TR BAND LRG (VASCULAR PRODUCTS) ×2 IMPLANT
GLIDESHEATH SLEND SS 6F .021 (SHEATH) ×2 IMPLANT
HOVERMATT SINGLE USE (MISCELLANEOUS) ×2 IMPLANT
KIT HEART LEFT (KITS) ×2 IMPLANT
PACK CARDIAC CATHETERIZATION (CUSTOM PROCEDURE TRAY) ×2 IMPLANT
SYR MEDRAD MARK V 150ML (SYRINGE) ×2 IMPLANT
TRANSDUCER W/STOPCOCK (MISCELLANEOUS) ×2 IMPLANT
TUBING CIL FLEX 10 FLL-RA (TUBING) ×2 IMPLANT
WIRE SAFE-T 1.5MM-J .035X260CM (WIRE) ×2 IMPLANT

## 2016-06-16 NOTE — Progress Notes (Signed)
PROGRESS NOTE    Justin Sampson  SKA:[id number] DOB: [birth year] DOA: 06/10/2016 PCP: No primary care provider on file.   Brief Narrative:  60 y.o. HM SPANISH SPEAKING ONLY PMHx HTN and obesity who presented to ED with "two sores on his bottom", very painful and weren't healing. In ED pt was hypotensive. ED MD noted black stool on rectal exam and started a PPI drip. CT pelvis showed gluteal abscess tracking towards the prostate with soft-tissue gas. He was taken to the OR by Dr Corliss Skains with drainage/debridement of a large abscess.      Assessment & Plan: Active Problems:   Gluteal abscess   Hypotension   Essential hypertension   Acute renal insufficiency   Melanotic stools   Sepsis (HCC)   Sepsis, unspecified organism (HCC)   Nonsustained ventricular tachycardia (HCC)   Hypokalemia   Hypomagnesemia   Systolic congestive heart failure (HCC)  Sepsis due to streptococcus species  -7/11 S/P I&D and wound care per Gen Surgery -will discontinue zosyn and following cultures results start patient on ceftin (with plans to treat for 10 days) -vancomycin discontinued on 7/14 -continue supportive care and increase activity   Hx of hypertension: presented with Hypotension -Resolved   -BP is now stable (and tolerating low dose lisinopril and coreg) -will continue current antihypertensive agents   Melena?- Normocytic anemia  -black stool according to ED MD - no clear evidence of active bleeding appreciated - follow Hgb, with current "drop" likely due to aggressive volume expansion and blood loss from during surgery; instead of GIB  -will continue pepcid   Recent Labs Lab 06/11/16 0501 06/12/16 0234 06/13/16 0230 06/14/16 0549 06/16/16 0241  HGB 10.3* 11.5* 10.8* 11.5* 11.5*  -has remained Stable  Renal insuff -Resolved  With IVF's  NSVT -Resolved  -will Maximize Mg and potassium  -Echocardiogram demonstrating EF 35-40% and some wall motion abnormalities    Hypomagnesemia -Repleted -will monitor trend  Hypokalemia -Repleted -will monitor trend  Morbid Obesity -  -Body mass index is 46.21 kg/(m^2). -low calorie diet and exercise discussed with patient   Presumed Chronic systolic heart failure: but new diagnosis for him  -compensated -EF 35-40% -started on low dose coreg and lisinopril -cardiology planning heart cath later today 7/17 (to r/o ischemic component) -will also add low dose lasix at discharge for volume control -discussed low sodium diet with patient -will follow up daily weights and strict intake and output while inpatient   GERD -will continue famotidine  Presumed OSA -will need outpatient sleep study and most likely CPAP at bedtime  DVT prophylaxis: SCD Code Status: Full Family Communication: friend at bedside  Disposition Plan: home in the next 24-48 hours   Consultants:  Gen Surgery PCCM Cardiology   Procedures/Significant Events:  7/11 I&D large R gluteal abscess in OR   2-D Echo: - Left ventricle: The cavity size was normal. Systolic function was  moderately reduced. The estimated ejection fraction was in the  range of 35% to 40%. Hypokinesis of the lateral and inferior  myocardium. - Left atrium: The atrium was moderately dilated.  Left Heart cath: planned for 7/17   Cultures 7/11 rectal wound positive culture for strep viridans and E.coli 7/11 right gluteal abscess pending 7/11 blood right hand 2 NGTD 7/11 urine negative  Antimicrobials: Zosyn 7/11 >7/14 Vanc 7/11 >7/15 ceftin 7/15  Continuous Infusions:    Subjective: A/O 4, in no acute distress. Denies CP. Patient with mild hypoxia when sleeping, using 2L [place].  Objective: Filed Vitals:  06/16/16 1105 06/16/16 1110 06/16/16 1115 06/16/16 1120  BP: 124/76 119/68 109/76 112/65  Pulse: 74 61 82 61  Temp:      TempSrc:      Resp: 18 24 19 29   Height:      Weight:      SpO2: 97% 95% 88% 94%    Intake/Output Summary  (Last 24 hours) at 06/16/16 1218 Last data filed at 06/16/16 0745  Gross per 24 hour  Intake      0 ml  Output   1275 ml  Net  -1275 ml   Filed Weights   06/14/16 2345 06/15/16 2003 06/16/16 0900  Weight: 130.1 kg (286 lb 13.1 oz) 128.6 kg (283 lb 8.2 oz) 123.605 kg (272 lb 8 oz)    Examination:  General: Obese, A/O 4, No acute respiratory distress. Denies CP. Afebrile currently. Eyes: negative scleral hemorrhage, negative anisocoria, negative icterus ENT: Negative Runny nose, negative gingival bleeding, fair dentition Neck:  Negative scars, masses, torticollis, lymphadenopathy, JVD Lungs: Clear to auscultation bilaterally without wheezes or crackles; good air movement  Cardiovascular: Regular rate and rhythm without murmur gallop or rub normal S1 and S2 Abdomen: morbid obesity, negativeabdominal pain, nondistended, positive soft, bowel sounds, no rebound, no ascites, no appreciable mass Extremities: No significant cyanosis, clubbing, or edema bilateral lower extremities Skin: Negative rashes or bruises; surgical incisions on buttocks draining copious amount of yellowish serosanguineous fluid. Psychiatric:  Negative depression, negative anxiety, negative fatigue, negative mania  Central nervous system:  Cranial nerves II through XII intact, tongue/uvula midline, all extremities muscle strength 5/5, sensation intact throughout,  negative dysarthria, negative expressive aphasia, negative receptive aphasia.  Data Reviewed: Care during the described time interval was provided by me .  I have reviewed this patient's available data, including medical history, events of note, physical examination, and all test results as part of my evaluation. I have personally reviewed and interpreted all radiology studies.  CBC:  Recent Labs Lab 06/10/16 1607  06/11/16 0501 06/12/16 0234 06/13/16 0230 06/14/16 0549 06/16/16 0241  WBC 12.3*  --  9.1 7.8 6.1 6.4 6.0  NEUTROABS 9.5*  --   --   --  3.8   --   --   HGB 14.0  < > 10.3* 11.5* 10.8* 11.5* 11.5*  HCT 40.3  < > 30.1* 34.1* 32.6* 34.6* 34.9*  MCV 91.0  --  92.6 92.9 92.9 93.0 93.3  PLT 367  --  285 313 312 331 399  < > = values in this interval not displayed. Basic Metabolic Panel:  Recent Labs Lab 06/11/16 0501 06/11/16 0506 06/12/16 0234 06/13/16 0230 06/14/16 0549 06/15/16 0308 06/16/16 0241  NA 133*  --  133* 133* 137  --  136  K 3.4*  --  3.7 3.5 3.2*  --  3.6  CL 106  --  103 101 100*  --  101  CO2 19*  --  25 23 29   --  28  GLUCOSE 101*  --  99 87 101*  --  92  BUN 12  --  6 <5* <5*  --  <5*  CREATININE 1.00  --  0.85 0.78 0.87  --  0.71  CALCIUM 7.8*  --  7.9* 8.2* 8.5*  --  8.8*  MG  --  1.6* 1.8 1.9 1.7 1.7  --   PHOS  --  3.6 2.7  --   --   --   --    GFR: Estimated Creatinine Clearance: 124.9 mL/min (  by C-G formula based on Cr of 0.71).   Liver Function Tests:  Recent Labs Lab 06/10/16 1607 06/11/16 0506 06/12/16 0234  AST 29 20  --   ALT 26 20  --   ALKPHOS 52 39  --   BILITOT 0.5 0.5  --   PROT 6.5 4.9*  --   ALBUMIN 2.6* 2.0* 2.0*   Cardiac Enzymes:  Recent Labs Lab 06/11/16 0506 06/11/16 1158 06/11/16 2034  TROPONINI 0.04* 0.03* 0.03*   CBG:  Recent Labs Lab 06/10/16 1605 06/10/16 2158  GLUCAP 122* 102*   Urine analysis:    Component Value Date/Time   COLORURINE YELLOW 06/10/2016 1850   APPEARANCEUR CLEAR 06/10/2016 1850   LABSPEC 1.029 06/10/2016 1850   PHURINE 6.0 06/10/2016 1850   GLUCOSEU NEGATIVE 06/10/2016 1850   HGBUR NEGATIVE 06/10/2016 1850   BILIRUBINUR NEGATIVE 06/10/2016 1850   KETONESUR NEGATIVE 06/10/2016 1850   PROTEINUR NEGATIVE 06/10/2016 1850   NITRITE NEGATIVE 06/10/2016 1850   LEUKOCYTESUR NEGATIVE 06/10/2016 1850    Recent Results (from the past 240 hour(s))  Aerobic Culture (superficial specimen)     Status: None   Collection Time: 06/10/16  4:26 PM  Result Value Ref Range Status   Specimen Description WOUND  Final   Special Requests  RECTUM  Final   Gram Stain   Final    ABUNDANT WBC PRESENT, PREDOMINANTLY PMN ABUNDANT GRAM VARIABLE ROD MODERATE GRAM POSITIVE COCCI IN PAIRS    Culture   Final    RARE ESCHERICHIA COLI SUSCEPTIBILITIES PERFORMED ON PREVIOUS CULTURE WITHIN THE LAST 5 DAYS. FEW VIRIDANS STREPTOCOCCUS    Report Status 06/14/2016 FINAL  Final  Blood Culture (routine x 2)     Status: None   Collection Time: 06/10/16  4:39 PM  Result Value Ref Range Status   Specimen Description BLOOD RIGHT HAND  Final   Special Requests BOTTLES DRAWN AEROBIC AND ANAEROBIC 5CC  Final   Culture NO GROWTH 5 DAYS  Final   Report Status 06/15/2016 FINAL  Final  Blood Culture (routine x 2)     Status: None   Collection Time: 06/10/16  4:56 PM  Result Value Ref Range Status   Specimen Description BLOOD RIGHT HAND  Final   Special Requests BOTTLES DRAWN AEROBIC ONLY 5CC  Final   Culture NO GROWTH 5 DAYS  Final   Report Status 06/15/2016 FINAL  Final  Urine culture     Status: None   Collection Time: 06/10/16  6:50 PM  Result Value Ref Range Status   Specimen Description URINE, CLEAN CATCH  Final   Special Requests NONE  Final   Culture NO GROWTH  Final   Report Status 06/12/2016 FINAL  Final  Aerobic/Anaerobic Culture (surgical/deep wound)     Status: None   Collection Time: 06/10/16  9:18 PM  Result Value Ref Range Status   Specimen Description ABSCESS  Final   Special Requests RT GLUTEAL PT ON VANC  Final   Gram Stain   Final    RARE WBC PRESENT, PREDOMINANTLY PMN FEW GRAM POSITIVE COCCI IN PAIRS RARE GRAM VARIABLE ROD FEW SQUAMOUS EPITHELIAL CELLS PRESENT    Culture   Final    MODERATE VIRIDANS STREPTOCOCCUS FEW ESCHERICHIA COLI MIXED ANAEROBIC FLORA PRESENT.  CALL LAB IF FURTHER IID REQUIRED.    Report Status 06/14/2016 FINAL  Final   Organism ID, Bacteria ESCHERICHIA COLI  Final      Susceptibility   Escherichia coli - MIC*    AMPICILLIN >=32 RESISTANT  Resistant     CEFAZOLIN <=4 SENSITIVE Sensitive      CEFEPIME <=1 SENSITIVE Sensitive     CEFTAZIDIME <=1 SENSITIVE Sensitive     CEFTRIAXONE <=1 SENSITIVE Sensitive     CIPROFLOXACIN <=0.25 SENSITIVE Sensitive     GENTAMICIN <=1 SENSITIVE Sensitive     IMIPENEM <=0.25 SENSITIVE Sensitive     TRIMETH/SULFA <=20 SENSITIVE Sensitive     AMPICILLIN/SULBACTAM 16 INTERMEDIATE Intermediate     PIP/TAZO <=4 SENSITIVE Sensitive     * FEW ESCHERICHIA COLI  MRSA PCR Screening     Status: None   Collection Time: 06/10/16 11:51 PM  Result Value Ref Range Status   MRSA by PCR NEGATIVE NEGATIVE Final    Comment:        The GeneXpert MRSA Assay (FDA approved for NASAL specimens only), is one component of a comprehensive MRSA colonization surveillance program. It is not intended to diagnose MRSA infection nor to guide or monitor treatment for MRSA infections.      Radiology Studies: No results found.   Scheduled Meds: . antiseptic oral rinse  7 mL Mouth Rinse BID  . carvedilol  6.25 mg Oral BID WC  . cefUROXime  500 mg Oral BID WC  . famotidine  20 mg Oral Daily  . heparin  5,000 Units Subcutaneous Q8H  . lisinopril  5 mg Oral Daily  . sodium chloride flush  3 mL Intravenous Q12H  . sodium chloride flush  3 mL Intravenous Q12H   Continuous Infusions:     LOS: 6 days    Time spent: 30 minutes    Vassie Loll, MD [facility] Pager [phone]  If 7PM-7AM, please contact night-coverage [email or web address] Password [facility] 06/16/2016, 12:18 PM

## 2016-06-16 NOTE — Progress Notes (Signed)
3w21 Justin Sampson: MD returned call and orders received to apply non re breather mask while sleeping and nasal cannula while awake per MD order. Non re breather applied, pt oxygen saturation now 100%. No complaints per pt. Will continue to monitor pt.

## 2016-06-16 NOTE — Progress Notes (Signed)
Interpreter Wyvonnia Dusky for Cath Lab  Eldridge Dace MD

## 2016-06-16 NOTE — Progress Notes (Signed)
Called cath lab to inquire about what needed to be done for patient before his procedure this morning. Was instructed to to give pt 81 mg of aspirin, start infuse fluids at 390.3 mL/hr for one hour, and then maintain fluids at 130.1 mL/hr until the procedure. With the assistance of the Spanish interpreter phone a basic explanation of the procedure was given to the patient so consent could be signed (with the explanation that a more throuough description would be given by the MD once the patient arrived to the cath lab.) Consent signed, fluid started, and all questions/concerns addressed. Pt currently resting in bed, will continue to monitor

## 2016-06-16 NOTE — Interval H&P Note (Signed)
Cath Lab Visit (complete for each Cath Lab visit)  Clinical Evaluation Leading to the Procedure:   ACS: Yes.    Non-ACS:    Anginal Classification: CCS IV  Anti-ischemic medical therapy: Minimal Therapy (1 class of medications)  Non-Invasive Test Results: No non-invasive testing performed  Prior CABG: No previous CABG  Acute heart failure.    History and Physical Interval Note:  06/16/2016 10:18 AM  Justin Sampson  has presented today for surgery, with the diagnosis of SOB  The various methods of treatment have been discussed with the patient and family. After consideration of risks, benefits and other options for treatment, the patient has consented to  Procedure(s): Left Heart Cath and Coronary Angiography (N/A) as a surgical intervention .  The patient's history has been reviewed, patient examined, no change in status, stable for surgery.  I have reviewed the patient's chart and labs.  Questions were answered to the patient's satisfaction.     Lance Muss

## 2016-06-16 NOTE — Progress Notes (Signed)
Report called to Cesar-RN on [facility]W. All questions/concerns addressed. Pt will be transferring from cath lab to [phone]

## 2016-06-16 NOTE — Progress Notes (Signed)
3w21 Heineman: Pt has desats frequently. MD made aware of pts frequent desats and pts history of sleep apnea. Awaiting MD to return call. Pt has no complaints at this time. Will continue to monitor pt. George Hugh RN

## 2016-06-16 NOTE — H&P (View-Only) (Signed)
   SUBJECTIVE: The patient is doing well today.  + some SOB.   At this time, he denies chest pain or any new concerns.  Marland Kitchen antiseptic oral rinse  7 mL Mouth Rinse BID  . carvedilol  6.25 mg Oral BID WC  . cefUROXime  500 mg Oral BID WC  . famotidine  20 mg Oral Daily  . lisinopril  5 mg Oral Daily   . sodium chloride 20 mL/hr at 06/14/16 1605    OBJECTIVE: Physical Exam: Filed Vitals:   06/15/16 0805 06/15/16 1001 06/15/16 1007 06/15/16 1010  BP:  108/77    Pulse:      Temp:      TempSrc:      Resp:      Height:      Weight:      SpO2: 92%  84% 96%    Intake/Output Summary (Last 24 hours) at 06/15/16 1303 Last data filed at 06/14/16 1600  Gross per 24 hour  Intake      0 ml  Output    400 ml  Net   -400 ml   GEN- The patient is overweight appearing, alert  Head- normocephalic, atraumatic Eyes-  Sclera clear, conjunctiva pink Ears- hearing intact Oropharynx- clear Neck- supple,  Lungs- Clear to ausculation bilaterally, normal work of breathing Heart- Regular rate and rhythm  GI- soft, NT, ND, + BS Extremities- no clubbing, cyanosis, + edema Skin- no rash or lesion Psych- euthymic mood, full affect Neuro- strength and sensation are intact  LABS: Basic Metabolic Panel:  Recent Labs  09/38/18 0230 06/14/16 0549 06/15/16 0308  NA 133* 137  --   K 3.5 3.2*  --   CL 101 100*  --   CO2 23 29  --   GLUCOSE 87 101*  --   BUN <5* <5*  --   CREATININE 0.78 0.87  --   CALCIUM 8.2* 8.5*  --   MG 1.9 1.7 1.7   CBC:  Recent Labs  06/13/16 0230 06/14/16 0549  WBC 6.1 6.4  NEUTROABS 3.8  --   HGB 10.8* 11.5*  HCT 32.6* 34.6*  MCV 92.9 93.0  PLT 312 331   Hemoglobin A1C:  Recent Labs  06/13/16 1300  HGBA1C 5.8*   Fasting Lipid Panel:  Recent Labs  06/14/16 0549  CHOL 109  HDL 31*  LDLCALC 63  TRIG 76  CHOLHDL 3.5   Thyroid Function Tests:  Recent Labs  06/13/16 2013  TSH 0.581    ASSESSMENT AND PLAN:    1. Acute systolic  dysfunction The patient has symptoms of CHF which appear to have been progressive over the past year. He has SOB at rest and with exertion. He has NSVT on telemetry and is found to have reduced EF. I worry that he is at high risk for ischemic cause. I would therefore favor LHC with possible PCI during this admission. I will place on cath schedule for Monday.  Orders placed.  NPO after midnight.  Will arrange for spanish interpretor for cath.  Dr Gwenlyn Perking has spoken in detail with patient about cath and patient wishes to proceed.  2. Morbid obesity Body mass index is 47.28 kg/(m^2). Weight loss is advised   Hillis Range, MD 06/15/2016 1:03 PM

## 2016-06-17 ENCOUNTER — Encounter (HOSPITAL_COMMUNITY): Payer: Self-pay | Admitting: Cardiology

## 2016-06-17 DIAGNOSIS — E662 Morbid (severe) obesity with alveolar hypoventilation: Secondary | ICD-10-CM | POA: Insufficient documentation

## 2016-06-17 DIAGNOSIS — I5042 Chronic combined systolic (congestive) and diastolic (congestive) heart failure: Secondary | ICD-10-CM | POA: Insufficient documentation

## 2016-06-17 DIAGNOSIS — I5023 Acute on chronic systolic (congestive) heart failure: Secondary | ICD-10-CM

## 2016-06-17 DIAGNOSIS — I428 Other cardiomyopathies: Secondary | ICD-10-CM

## 2016-06-17 HISTORY — DX: Other cardiomyopathies: I42.8

## 2016-06-17 MED ORDER — FUROSEMIDE 10 MG/ML IJ SOLN
80.0000 mg | Freq: Two times a day (BID) | INTRAMUSCULAR | Status: DC
  Administered 2016-06-17 – 2016-06-20 (×6): 80 mg via INTRAVENOUS
  Filled 2016-06-17 (×6): qty 8

## 2016-06-17 NOTE — Progress Notes (Signed)
Nutrition Consult for Diet Education  RD provided "Low Sodium Nutrition Therapy" handout in Spanish for patient to review.   Joaquin Courts, RD, LDN, CNSC Pager [phone] After Hours Pager [phone]

## 2016-06-17 NOTE — Progress Notes (Signed)
Dressing to right buttock  changed, no s/s of infection noted.  Colleen Can, RN

## 2016-06-17 NOTE — Progress Notes (Signed)
RN called RT to place a non rebreather on patient for low oxygenation.

## 2016-06-17 NOTE — Care Management (Addendum)
Case Management Note Initial Note by Kermit Balo, RN  Patient Details  Name: Justin Sampson MRN: [id number] Date of Birth: 1956-04-16  Subjective/Objective:Pt admitted for CHF. Pt is without insurance. Has support of friends at d/c.     Action/Plan: Dr Gwenlyn Perking interested in having some HH services for the patient at home for wound care. Pt does not have insurance and so referral made to [facility]. Tiffany with [facility] to see if patient qualifies for charity services. MD informed need for orders for wound care and for [facility] services. CM will continue to follow for d/c needs.   Expected Discharge Date: 06/13/16   Expected Discharge Plan: Home w Home Health Services  In-House Referral:    Discharge planning Services CM Consult, Indigent Health Clinic  Post Acute Care Choice:   Choice offered to:    DME Arranged: CPAP   DME Agency:  [facility]  HH Arranged: RN, Social Work [employer] Agency: [facility]  Status of Service: In process, will continue to follow  If discussed at Long Length of Stay Meetings, dates discussed:   Additional Comments: 06-17-16 Tomi Bamberger, RN,BSN [phone] Pt is set up with [facility] for [facility]. Sleep Study scheduled with [facility]. [facility] is aware of CPAP- plan for charity care. Company to discuss the CPAP. No further needs from CM at this time.

## 2016-06-17 NOTE — Progress Notes (Signed)
PROGRESS NOTE    Justin Sampson  ZOX:[id number] DOB: January 20, 1956 DOA: 06/10/2016 PCP: No primary care provider on file.   Brief Narrative:  60 y.o. HM SPANISH SPEAKING ONLY PMHx HTN and obesity who presented to ED with "two sores on his bottom", very painful and weren't healing. In ED pt was hypotensive. ED MD noted black stool on rectal exam and started a PPI drip. CT pelvis showed gluteal abscess tracking towards the prostate with soft-tissue gas. He was taken to the OR by Dr Corliss Skains with drainage/debridement of a large abscess.      Assessment & Plan: Principal Problem:   Systolic congestive heart failure (HCC) Active Problems:   Gluteal abscess   Hypotension   Essential hypertension   Acute renal insufficiency   Melanotic stools   Sepsis (HCC)   Sepsis, unspecified organism (HCC)   Nonsustained ventricular tachycardia (HCC)   Hypokalemia   Hypomagnesemia   Cardiomyopathy, nonischemic (HCC)   Acute on chronic systolic CHF (congestive heart failure) (HCC)  Sepsis due to streptococcus species  -7/11 S/P I&D and wound care per Gen Surgery -zosyn Discontinued on 7/15 and following cultures results patient started on ceftin (with plans to treat for 10 days; currently on day 4/10) -vancomycin discontinued on 7/14 -continue supportive care and increase activity   Hx of hypertension: presented with Hypotension  -hypotension Resolved   -BP is now stable (and tolerating low dose lisinopril and coreg) -will continue current antihypertensive agents  -advise to follow heart healthy and low sodium diet   Melena?- Normocytic anemia  -black stool according to ED MD -no clear evidence of active bleeding appreciated during hospitalization -follow Hgb trend; current "drop" likely due to aggressive volume expansion and blood loss from during surgery; instead of GIB  -will continue pepcid   Recent Labs Lab 06/11/16 0501 06/12/16 0234 06/13/16 0230 06/14/16 0549 06/16/16 0241  HGB  10.3* 11.5* 10.8* 11.5* 11.5*  -Hgb has remained Stable -check intermittently   Renal insuff -Resolved  With IVF's -renal function WNL now  NSVT -Resolved  -will Maximize Mg and potassium  -Echocardiogram demonstrating EF 35-40% and some wall motion abnormalities  -will need close follow up with cardiology  Hypomagnesemia -Repleted -will monitor trend and replete as needed   Hypokalemia -Repleted -will monitor trend  Morbid Obesity -  -Body mass index is 46.21 kg/(m^2). -low calorie diet and exercise discussed with patient   Presumed Chronic systolic heart failure: but new diagnosis for him  -with exacerbation after IVF's for cath -per Echo EF 35-40% -started on low dose coreg and lisinopril -heart cath demonstrating non-ischemic cardiomyopathy  -I have discussed low sodium diet with patient -will follow up daily weights and strict intake and output  -per cardiology rec's will give IV lasix for a day or two -he will also need lasix at discharge for volume control  GERD -will continue famotidine  Presumed OSA -will need outpatient sleep study and most likely CPAP at bedtime  Acute resp failure with hypoxia: -due to mild CHF exacerbation after IVF's given prior to cath; on top of fluid expansion given for sepsis   -continue oxygen supplementation -on IV lasix currently -follow daily weights and strict I's and O's  DVT prophylaxis: SCD Code Status: Full Family Communication: friend at bedside  Disposition Plan: home in the next 24-48 hours   Consultants:  Gen Surgery PCCM Cardiology   Procedures/Significant Events:  7/11 I&D large R gluteal abscess in OR   2-D Echo: - Left ventricle: The cavity size  was normal. Systolic function was  moderately reduced. The estimated ejection fraction was in the  range of 35% to 40%. Hypokinesis of the lateral and inferior  myocardium. - Left atrium: The atrium was moderately dilated.  Left Heart cath: planned for  7/17   Cultures 7/11 rectal wound positive culture for strep viridans and E.coli 7/11 right gluteal abscess pending 7/11 blood right hand 2 NGTD 7/11 urine negative  Antimicrobials: Zosyn 7/11 >7/14 Vanc 7/11 >7/15 ceftin 7/15  Continuous Infusions:    Subjective: A/O 4, in mild resp distress and requiring oxygen supplementation. Denies CP. Patient with mild hypoxia when sleeping, and now with activity as well.  Objective: Filed Vitals:   06/17/16 0330 06/17/16 0726 06/17/16 0835 06/17/16 1111  BP: 128/70 106/72 119/86   Pulse: 78 70 72   Temp: 97.9 F (36.6 C) 98.4 F (36.9 C)  98.1 F (36.7 C)  TempSrc: Oral Oral  Oral  Resp: 28 27    Height:      Weight: 123.152 kg (271 lb 8 oz)     SpO2: 99% 97%      Intake/Output Summary (Last 24 hours) at 06/17/16 1711 Last data filed at 06/17/16 1245  Gross per 24 hour  Intake    240 ml  Output      0 ml  Net    240 ml   Filed Weights   06/15/16 2003 06/16/16 0900 06/17/16 0330  Weight: 128.6 kg (283 lb 8.2 oz) 123.605 kg (272 lb 8 oz) 123.152 kg (271 lb 8 oz)    Examination:  General: Obese, A/O 4, in mild respiratory distress. Denies CP. Afebrile currently. Eyes: negative scleral hemorrhage, negative anisocoria, negative icterus ENT: Negative Runny nose, negative gingival bleeding, fair dentition Neck:  Negative scars, masses, torticollis, lymphadenopathy, JVD unable to properly access due to body habitus  Lungs: Clear to auscultation bilaterally without wheezes or frank crackles Cardiovascular: Regular rate and rhythm without murmur gallop or rub normal S1 and S2 Abdomen: morbid obesity, negativeabdominal pain, nondistended, positive soft, bowel sounds, no rebound, no ascites, no appreciable mass Extremities: No significant cyanosis or clubbing, 1+ edema bilateral lower extremities Skin: Negative rashes or bruises; surgical incisions on buttocks draining copious amount of yellowish serosanguineous  fluid. Psychiatric:  Negative depression, negative anxiety, negative fatigue, negative mania  Central nervous system:  Cranial nerves II through XII intact, tongue/uvula midline, all extremities muscle strength 5/5, sensation intact throughout,  negative dysarthria, negative expressive aphasia, negative receptive aphasia.  Data Reviewed: Care during the described time interval was provided by me .  I have reviewed this patient's available data, including medical history, events of note, physical examination, and all test results as part of my evaluation. I have personally reviewed and interpreted all radiology studies.  CBC:  Recent Labs Lab 06/11/16 0501 06/12/16 0234 06/13/16 0230 06/14/16 0549 06/16/16 0241  WBC 9.1 7.8 6.1 6.4 6.0  NEUTROABS  --   --  3.8  --   --   HGB 10.3* 11.5* 10.8* 11.5* 11.5*  HCT 30.1* 34.1* 32.6* 34.6* 34.9*  MCV 92.6 92.9 92.9 93.0 93.3  PLT 285 313 312 331 399   Basic Metabolic Panel:  Recent Labs Lab 06/11/16 0501 06/11/16 0506 06/12/16 0234 06/13/16 0230 06/14/16 0549 06/15/16 0308 06/16/16 0241  NA 133*  --  133* 133* 137  --  136  K 3.4*  --  3.7 3.5 3.2*  --  3.6  CL 106  --  103 101 100*  --  101  CO2 19*  --  25 23 29   --  28  GLUCOSE 101*  --  99 87 101*  --  92  BUN 12  --  6 <5* <5*  --  <5*  CREATININE 1.00  --  0.85 0.78 0.87  --  0.71  CALCIUM 7.8*  --  7.9* 8.2* 8.5*  --  8.8*  MG  --  1.6* 1.8 1.9 1.7 1.7  --   PHOS  --  3.6 2.7  --   --   --   --    GFR: Estimated Creatinine Clearance: 124.7 mL/min (by C-G formula based on Cr of 0.71).   Liver Function Tests:  Recent Labs Lab 06/11/16 0506 06/12/16 0234  AST 20  --   ALT 20  --   ALKPHOS 39  --   BILITOT 0.5  --   PROT 4.9*  --   ALBUMIN 2.0* 2.0*   Cardiac Enzymes:  Recent Labs Lab 06/11/16 0506 06/11/16 1158 06/11/16 2034  TROPONINI 0.04* 0.03* 0.03*   CBG:  Recent Labs Lab 06/10/16 2158  GLUCAP 102*   Urine analysis:    Component Value  Date/Time   COLORURINE YELLOW 06/10/2016 1850   APPEARANCEUR CLEAR 06/10/2016 1850   LABSPEC 1.029 06/10/2016 1850   PHURINE 6.0 06/10/2016 1850   GLUCOSEU NEGATIVE 06/10/2016 1850   HGBUR NEGATIVE 06/10/2016 1850   BILIRUBINUR NEGATIVE 06/10/2016 1850   KETONESUR NEGATIVE 06/10/2016 1850   PROTEINUR NEGATIVE 06/10/2016 1850   NITRITE NEGATIVE 06/10/2016 1850   LEUKOCYTESUR NEGATIVE 06/10/2016 1850    Recent Results (from the past 240 hour(s))  Aerobic Culture (superficial specimen)     Status: None   Collection Time: 06/10/16  4:26 PM  Result Value Ref Range Status   Specimen Description WOUND  Final   Special Requests RECTUM  Final   Gram Stain   Final    ABUNDANT WBC PRESENT, PREDOMINANTLY PMN ABUNDANT GRAM VARIABLE ROD MODERATE GRAM POSITIVE COCCI IN PAIRS    Culture   Final    RARE ESCHERICHIA COLI SUSCEPTIBILITIES PERFORMED ON PREVIOUS CULTURE WITHIN THE LAST 5 DAYS. FEW VIRIDANS STREPTOCOCCUS    Report Status 06/14/2016 FINAL  Final  Blood Culture (routine x 2)     Status: None   Collection Time: 06/10/16  4:39 PM  Result Value Ref Range Status   Specimen Description BLOOD RIGHT HAND  Final   Special Requests BOTTLES DRAWN AEROBIC AND ANAEROBIC 5CC  Final   Culture NO GROWTH 5 DAYS  Final   Report Status 06/15/2016 FINAL  Final  Blood Culture (routine x 2)     Status: None   Collection Time: 06/10/16  4:56 PM  Result Value Ref Range Status   Specimen Description BLOOD RIGHT HAND  Final   Special Requests BOTTLES DRAWN AEROBIC ONLY 5CC  Final   Culture NO GROWTH 5 DAYS  Final   Report Status 06/15/2016 FINAL  Final  Urine culture     Status: None   Collection Time: 06/10/16  6:50 PM  Result Value Ref Range Status   Specimen Description URINE, CLEAN CATCH  Final   Special Requests NONE  Final   Culture NO GROWTH  Final   Report Status 06/12/2016 FINAL  Final  Aerobic/Anaerobic Culture (surgical/deep wound)     Status: None   Collection Time: 06/10/16  9:18  PM  Result Value Ref Range Status   Specimen Description ABSCESS  Final   Special Requests RT GLUTEAL PT ON  VANC  Final   Gram Stain   Final    RARE WBC PRESENT, PREDOMINANTLY PMN FEW GRAM POSITIVE COCCI IN PAIRS RARE GRAM VARIABLE ROD FEW SQUAMOUS EPITHELIAL CELLS PRESENT    Culture   Final    MODERATE VIRIDANS STREPTOCOCCUS FEW ESCHERICHIA COLI MIXED ANAEROBIC FLORA PRESENT.  CALL LAB IF FURTHER IID REQUIRED.    Report Status 06/14/2016 FINAL  Final   Organism ID, Bacteria ESCHERICHIA COLI  Final      Susceptibility   Escherichia coli - MIC*    AMPICILLIN >=32 RESISTANT Resistant     CEFAZOLIN <=4 SENSITIVE Sensitive     CEFEPIME <=1 SENSITIVE Sensitive     CEFTAZIDIME <=1 SENSITIVE Sensitive     CEFTRIAXONE <=1 SENSITIVE Sensitive     CIPROFLOXACIN <=0.25 SENSITIVE Sensitive     GENTAMICIN <=1 SENSITIVE Sensitive     IMIPENEM <=0.25 SENSITIVE Sensitive     TRIMETH/SULFA <=20 SENSITIVE Sensitive     AMPICILLIN/SULBACTAM 16 INTERMEDIATE Intermediate     PIP/TAZO <=4 SENSITIVE Sensitive     * FEW ESCHERICHIA COLI  MRSA PCR Screening     Status: None   Collection Time: 06/10/16 11:51 PM  Result Value Ref Range Status   MRSA by PCR NEGATIVE NEGATIVE Final    Comment:        The GeneXpert MRSA Assay (FDA approved for NASAL specimens only), is one component of a comprehensive MRSA colonization surveillance program. It is not intended to diagnose MRSA infection nor to guide or monitor treatment for MRSA infections.      Radiology Studies: No results found.   Scheduled Meds: . antiseptic oral rinse  7 mL Mouth Rinse BID  . carvedilol  6.25 mg Oral BID WC  . cefUROXime  500 mg Oral BID WC  . famotidine  20 mg Oral Daily  . furosemide  80 mg Intravenous BID  . heparin  5,000 Units Subcutaneous Q8H  . lisinopril  5 mg Oral Daily  . sodium chloride flush  3 mL Intravenous Q12H  . sodium chloride flush  3 mL Intravenous Q12H   Continuous Infusions:     LOS: 7  days    Time spent: 30 minutes    Vassie Loll, MD [facility] Pager [phone]  If 7PM-7AM, please contact night-coverage [email or web address] Password [email or web address] 06/17/2016, 5:11 PM

## 2016-06-17 NOTE — Progress Notes (Signed)
Subjective: Still with oxygen SOB  NO CP  Objective: Vital signs in last 24 hours: Temp:  [97.4 F (36.3 C)-98.4 F (36.9 C)] 98.4 F (36.9 C) (07/18 0726) Pulse Rate:  [57-88] 72 (07/18 0835) Resp:  [15-31] 27 (07/18 0726) BP: (106-142)/(58-112) 119/86 mmHg (07/18 0835) SpO2:  [88 %-100 %] 97 % (07/18 0726) Weight:  [271 lb 8 oz (123.152 kg)] 271 lb 8 oz (123.152 kg) (07/18 0330) Weight change: -11 lb 0.2 oz (-4.995 kg) Last BM Date: 06/15/16 Intake/Output from previous day: -1103 since admit ( today +195) 07/17 0701 - 07/18 0700 In: 420 [P.O.:420] Out: 225 [Urine:225] Intake/Output this shift:    VW:UJWJ per Dr. Tenny Craw General:Pleasant affect, NAD Skin:Warm and dry, brisk capillary refill HEENT:normocephalic, sclera clear, mucus membranes moist Neck:supple, no JVD, no bruits  Heart:S1S2 RRR without murmur, gallup, rub or click Lungs:clear without rales, rhonchi, or wheezes XBJ:YNWG, non tender, + BS, do not palpate liver spleen or masses Ext:no lower ext edema, 2+ pedal pulses, 2+ radial pulses Neuro:alert and oriented, MAE, follows commands, + facial symmetry Tele: SR to SB  Lab Results:  Recent Labs  06/16/16 0241  WBC 6.0  HGB 11.5*  HCT 34.9*  PLT 399   BMET  Recent Labs  06/16/16 0241  NA 136  K 3.6  CL 101  CO2 28  GLUCOSE 92  BUN <5*  CREATININE 0.71  CALCIUM 8.8*   No results for input(s): TROPONINI in the last 72 hours.  Invalid input(s): CK, MB  Lab Results  Component Value Date   CHOL 109 06/14/2016   HDL 31* 06/14/2016   LDLCALC 63 06/14/2016   TRIG 76 06/14/2016   CHOLHDL 3.5 06/14/2016   Lab Results  Component Value Date   HGBA1C 5.8* 06/13/2016     Lab Results  Component Value Date   TSH 0.581 06/13/2016    Hepatic Function Panel No results for input(s): PROT, ALBUMIN, AST, ALT, ALKPHOS, BILITOT, BILIDIR, IBILI in the last 72 hours. No results for input(s): CHOL in the last 72 hours. No results for  input(s): PROTIME in the last 72 hours.     Studies/Results: Cardiac cath: Procedures    Left Heart Cath and Coronary Angiography    Conclusion     There is moderate to severe left ventricular systolic dysfunction.  No signficant CAD.  Mildly elevated LVEDP.  Nonischemic cardiomyopathy. Continue medical therapy. I also recommended weight loss to him.      Indications    Acute systolic heart failure (HCC) [I50.21 (ICD-10-CM)]        ECHO: LV EF: 35% - 40%  ------------------------------------------------------------------- Indications: Abnormal EKG 794.31.  ------------------------------------------------------------------- History: Risk factors: Hypertension.  ------------------------------------------------------------------- Study Conclusions  - Left ventricle: The cavity size was normal. Systolic function was  moderately reduced. The estimated ejection fraction was in the  range of 35% to 40%. Hypokinesis of the lateral and inferior  myocardium. - Left atrium: The atrium was moderately dilated.  Medications: I have reviewed the patient's current medications. Scheduled Meds: . antiseptic oral rinse  7 mL Mouth Rinse BID  . carvedilol  6.25 mg Oral BID WC  . cefUROXime  500 mg Oral BID WC  . famotidine  20 mg Oral Daily  . heparin  5,000 Units Subcutaneous Q8H  . lisinopril  5 mg Oral Daily  . sodium chloride flush  3 mL Intravenous Q12H  . sodium chloride flush  3 mL Intravenous Q12H   Continuous  Infusions:  PRN Meds:.sodium chloride, sodium chloride, [DISCONTINUED] acetaminophen **OR** acetaminophen, acetaminophen, ondansetron (ZOFRAN) IV, ondansetron **OR** [DISCONTINUED] ondansetron (ZOFRAN) IV, oxyCODONE, sodium chloride flush, sodium chloride flush  Assessment/Plan: Principal Problem:   Systolic congestive heart failure (HCC) Active Problems:   Cardiomyopathy, nonischemic (HCC)   Gluteal abscess   Hypotension    Essential hypertension   Acute renal insufficiency   Melanotic stools   Sepsis (HCC)   Sepsis, unspecified organism (HCC)   Nonsustained ventricular tachycardia (HCC)   Hypokalemia   Hypomagnesemia    LOS: 7 days   Time spent with pt. :15 minutes. Nada Boozer  Nurse Practitioner Certified Pager [phone] or after 5pm and on weekends call [phone] 06/17/2016, 10:56 AM   Pt seen and examined  I agree with findings as noted above by L ingold   Pt still SOB  ON exam, using NRB LUngs rel clear anteriorly  Cardliac RRR  No S3  Ext Tr edema  Cath yesterday with no isgnif CAD  LVEDP was signif elevated   I would recomm IV diuresis 80 bid

## 2016-06-18 DIAGNOSIS — I429 Cardiomyopathy, unspecified: Secondary | ICD-10-CM

## 2016-06-18 DIAGNOSIS — I10 Essential (primary) hypertension: Secondary | ICD-10-CM

## 2016-06-18 DIAGNOSIS — I5020 Unspecified systolic (congestive) heart failure: Secondary | ICD-10-CM

## 2016-06-18 LAB — BASIC METABOLIC PANEL
Anion gap: 9 (ref 5–15)
BUN: <5 mg/dL — ABNORMAL LOW (ref 6–20)
CALCIUM: 9.1 mg/dL (ref 8.9–10.3)
CO2: 32 mmol/L (ref 22–32)
CREATININE: 0.76 mg/dL (ref 0.61–1.24)
Chloride: 99 mmol/L — ABNORMAL LOW (ref 101–111)
GFR calc Af Amer: >60 mL/min (ref >60)
GFR calc non Af Amer: >60 mL/min (ref >60)
GLUCOSE: 93 mg/dL (ref 65–99)
Potassium: 3.6 mmol/L (ref 3.5–5.1)
Sodium: 140 mmol/L (ref 135–145)

## 2016-06-18 LAB — BRAIN NATRIURETIC PEPTIDE: B Natriuretic Peptide: 38 pg/mL (ref 0.0–100.0)

## 2016-06-18 LAB — MAGNESIUM: Magnesium: 1.8 mg/dL (ref 1.7–2.4)

## 2016-06-18 NOTE — Progress Notes (Signed)
[facility]                                                                              Patient Demographics  Justin Sampson, is a 60 y.o. male, DOB - [birth year], VOH:[id number]  Admit date - 06/10/2016   Admitting Physician Delano Metz, MD  Outpatient Primary MD for the patient is No primary care provider on file.  Outpatient specialists:   LOS - 8  days    Chief Complaint  Patient presents with  . Wound Infection       Brief summary   60 y.o. HM SPANISH SPEAKING ONLY PMHx HTN and obesity who presented to ED with "two sores on his bottom", very painful and weren't healing. In ED pt was hypotensive. ED MD noted black stool on rectal exam and started a PPI drip. CT pelvis showed gluteal abscess tracking towards the prostate with soft-tissue gas. He was taken to the OR by Dr Corliss Skains with drainage/debridement of a large abscess.      Assessment & Plan    Right gluteal abscess  -7/11 S/P I&D and wound care per [facility] -zosyn discontinued on 7/15 and following cultures results patient started on ceftin. wound cultures showed Escherichia coli, strep viridans  -vancomycin discontinued on 7/14 -continue supportive care and increase activity.   - Blood cultures negative, sepsis ruled out  Hx of hypertension: presented with Hypotension  -BP low normal , On beta blocker, lisinopril   Melena?- Normocytic anemia  -black stool according to ED MD, no clear evidence of active bleeding appreciated during hospitalization -follow Hgb trend; current "drop" likely due to aggressive volume expansion and blood loss from during surgery; instead of GIB  -will continue pepcid, H&H stable   Renal insuff -Resolved With IVF's -renal function WNL now  NSVT -Resolved, keep magnesium around 2, potassium around 4 -Echocardiogram demonstrating EF 35-40% and some wall motion abnormalities    Hypomagnesemia, Hypomagnesemia replaced   Morbid Obesity -  -Body mass  index is 46.21 kg/(m^2). -low calorie diet and exercise discussed with patient   Presumed Chronic systolic heart failure: but new diagnosis for him  -per Echo EF 35-40%, Cardiac cath with normal coronaries  -Cardiology following, continue IV Lasix for diuresis, negative balance of 1.56 L  - Heart healthy, low-sodium diet   GERD -will continue famotidine  Presumed OSA -will need outpatient sleep study and most likely CPAP at bedtime  Acute resp failure with hypoxia: Improving, likely due to mild CHF exacerbation and received IV fluids   Code Status: Full CODE STATUS DVT Prophylaxis:   SCD's Family Communication: Discussed in detail with the patient, all imaging results, lab results explained to the patient  Disposition Plan:  When cleared by cardiology   Time Spent in minutes  25 minutes  Procedures:  7/11 I&D large R gluteal abscess in OR   2-D Echo: - Left ventricle: The cavity size was normal. Systolic function was  moderately reduced. The estimated ejection fraction was in the  range of 35% to 40%. Hypokinesis of the lateral and inferior  myocardium. - Left atrium: The atrium was moderately dilated.  Left Heart cath:  7/17  Consultants:   Cardiology   Antimicrobials:   Zosyn 7/11 >7/14 Vanc 7/11 >7/15 ceftin 7/15   Medications  Scheduled Meds: . antiseptic oral rinse  7 mL Mouth Rinse BID  . carvedilol  6.25 mg Oral BID WC  . cefUROXime  500 mg Oral BID WC  . famotidine  20 mg Oral Daily  . furosemide  80 mg Intravenous BID  . heparin  5,000 Units Subcutaneous Q8H  . lisinopril  5 mg Oral Daily  . sodium chloride flush  3 mL Intravenous Q12H  . sodium chloride flush  3 mL Intravenous Q12H   Continuous Infusions:  PRN Meds:.sodium chloride, sodium chloride, [DISCONTINUED] acetaminophen **OR** acetaminophen, acetaminophen, ondansetron (ZOFRAN) IV, ondansetron **OR** [DISCONTINUED] ondansetron (ZOFRAN) IV, oxyCODONE, sodium chloride flush, sodium chloride  flush   Antibiotics   Anti-infectives    Start     Dose/Rate Route Frequency Ordered Stop   06/14/16 1700  cefUROXime (CEFTIN) tablet 500 mg     500 mg Oral 2 times daily with meals 06/14/16 1458     06/11/16 1000  vancomycin (VANCOCIN) IVPB 1000 mg/200 mL premix  Status:  Discontinued     1,000 mg 200 mL/hr over 60 Minutes Intravenous Every 12 hours 06/11/16 0915 06/13/16 1400   06/11/16 0700  vancomycin (VANCOCIN) IVPB 750 mg/150 ml premix  Status:  Discontinued     750 mg 150 mL/hr over 60 Minutes Intravenous Every 12 hours 06/10/16 1721 06/11/16 0915   06/10/16 2300  piperacillin-tazobactam (ZOSYN) IVPB 3.375 g  Status:  Discontinued     3.375 g 12.5 mL/hr over 240 Minutes Intravenous Every 8 hours 06/10/16 1721 06/14/16 1458   06/10/16 1745  vancomycin (VANCOCIN) IVPB 1000 mg/200 mL premix     1,000 mg 200 mL/hr over 60 Minutes Intravenous  Once 06/10/16 1632 06/10/16 1839   06/10/16 1630  piperacillin-tazobactam (ZOSYN) IVPB 3.375 g     3.375 g 100 mL/hr over 30 Minutes Intravenous  Once 06/10/16 1624 06/10/16 1814   06/10/16 1630  vancomycin (VANCOCIN) IVPB 1000 mg/200 mL premix     1,000 mg 200 mL/hr over 60 Minutes Intravenous  Once 06/10/16 1624 06/10/16 1945        Subjective:   Justin Sampson was seen and examined today. Feeling better, overall improving, denies any chest pain. O2 sats 95% on room air. Patient denies dizziness, chest pain, abdominal pain, N/V/D/C, new weakness, numbess, tingling. No acute events overnight.    Objective:   Filed Vitals:   06/18/16 0600 06/18/16 0700 06/18/16 0800 06/18/16 0914  BP:   118/65 103/70  Pulse: 76 70 63 76  Temp:   98.2 F (36.8 C)   TempSrc:   Oral   Resp: 28 20 19 24   Height:      Weight:      SpO2: 99% 92% 100% 97%    Intake/Output Summary (Last 24 hours) at 06/18/16 1114 Last data filed at 06/18/16 0924  Gross per 24 hour  Intake    840 ml  Output   1300 ml  Net   -460 ml     Wt Readings from Last 3  Encounters:  06/18/16 121.473 kg (267 lb 12.8 oz)     Exam  General: Alert and oriented x 3, NAD  HEENT:  PERRLA, EOMI, Anicteric Sclera, mucous membranes moist.   Neck: Supple, no JVD, no masses  Cardiovascular: S1 S2 auscultated, no rubs, murmurs or gallops. Regular rate and rhythm.  Respiratory: Clear to auscultation bilaterally,  no wheezing, rales or rhonchi  Gastrointestinal: Soft, nontender, nondistended, + bowel sounds  Ext: no cyanosis clubbing, 1+ edema  Neuro: AAOx3, Cr N's II- XII. Strength 5/5 upper and lower extremities bilaterally  Skin:  dressing intact in the gluteal region  psych: Normal affect and demeanor, alert and oriented x3    Data Reviewed:  I have personally reviewed following labs and imaging studies  Micro Results Recent Results (from the past 240 hour(s))  Aerobic Culture (superficial specimen)     Status: None   Collection Time: 06/10/16  4:26 PM  Result Value Ref Range Status   Specimen Description WOUND  Final   Special Requests RECTUM  Final   Gram Stain   Final    ABUNDANT WBC PRESENT, PREDOMINANTLY PMN ABUNDANT GRAM VARIABLE ROD MODERATE GRAM POSITIVE COCCI IN PAIRS    Culture   Final    RARE ESCHERICHIA COLI SUSCEPTIBILITIES PERFORMED ON PREVIOUS CULTURE WITHIN THE LAST 5 DAYS. FEW VIRIDANS STREPTOCOCCUS    Report Status 06/14/2016 FINAL  Final  Blood Culture (routine x 2)     Status: None   Collection Time: 06/10/16  4:39 PM  Result Value Ref Range Status   Specimen Description BLOOD RIGHT HAND  Final   Special Requests BOTTLES DRAWN AEROBIC AND ANAEROBIC 5CC  Final   Culture NO GROWTH 5 DAYS  Final   Report Status 06/15/2016 FINAL  Final  Blood Culture (routine x 2)     Status: None   Collection Time: 06/10/16  4:56 PM  Result Value Ref Range Status   Specimen Description BLOOD RIGHT HAND  Final   Special Requests BOTTLES DRAWN AEROBIC ONLY 5CC  Final   Culture NO GROWTH 5 DAYS  Final   Report Status 06/15/2016 FINAL   Final  Urine culture     Status: None   Collection Time: 06/10/16  6:50 PM  Result Value Ref Range Status   Specimen Description URINE, CLEAN CATCH  Final   Special Requests NONE  Final   Culture NO GROWTH  Final   Report Status 06/12/2016 FINAL  Final  Aerobic/Anaerobic Culture (surgical/deep wound)     Status: None   Collection Time: 06/10/16  9:18 PM  Result Value Ref Range Status   Specimen Description ABSCESS  Final   Special Requests RT GLUTEAL PT ON VANC  Final   Gram Stain   Final    RARE WBC PRESENT, PREDOMINANTLY PMN FEW GRAM POSITIVE COCCI IN PAIRS RARE GRAM VARIABLE ROD FEW SQUAMOUS EPITHELIAL CELLS PRESENT    Culture   Final    MODERATE VIRIDANS STREPTOCOCCUS FEW ESCHERICHIA COLI MIXED ANAEROBIC FLORA PRESENT.  CALL LAB IF FURTHER IID REQUIRED.    Report Status 06/14/2016 FINAL  Final   Organism ID, Bacteria ESCHERICHIA COLI  Final      Susceptibility   Escherichia coli - MIC*    AMPICILLIN >=32 RESISTANT Resistant     CEFAZOLIN <=4 SENSITIVE Sensitive     CEFEPIME <=1 SENSITIVE Sensitive     CEFTAZIDIME <=1 SENSITIVE Sensitive     CEFTRIAXONE <=1 SENSITIVE Sensitive     CIPROFLOXACIN <=0.25 SENSITIVE Sensitive     GENTAMICIN <=1 SENSITIVE Sensitive     IMIPENEM <=0.25 SENSITIVE Sensitive     TRIMETH/SULFA <=20 SENSITIVE Sensitive     AMPICILLIN/SULBACTAM 16 INTERMEDIATE Intermediate     PIP/TAZO <=4 SENSITIVE Sensitive     * FEW ESCHERICHIA COLI  MRSA PCR Screening     Status: None   Collection Time: 06/10/16 11:51  PM  Result Value Ref Range Status   MRSA by PCR NEGATIVE NEGATIVE Final    Comment:        The GeneXpert MRSA Assay (FDA approved for NASAL specimens only), is one component of a comprehensive MRSA colonization surveillance program. It is not intended to diagnose MRSA infection nor to guide or monitor treatment for MRSA infections.     Radiology Reports Ct Abdomen Pelvis W Contrast  06/10/2016  CLINICAL DATA:  Perirectal pain with  focal wound EXAM: CT ABDOMEN AND PELVIS WITH CONTRAST TECHNIQUE: Multidetector CT imaging of the abdomen and pelvis was performed using the standard protocol following bolus administration of intravenous contrast. CONTRAST:  100 mL ISOVUE-300 IOPAMIDOL (ISOVUE-300) INJECTION 61% COMPARISON:  None. FINDINGS: Lower chest:  No acute findings. Hepatobiliary: The liver is mildly fatty infiltrated. The gallbladder is within normal limits. Pancreas: No mass, inflammatory changes, or other significant abnormality. Spleen: Within normal limits in size and appearance. Adrenals/Urinary Tract: No masses identified. No evidence of hydronephrosis. Small right renal cyst is seen. The bladder is well distended. Stomach/Bowel: No evidence of obstruction, inflammatory process, or abnormal fluid collections. The appendix is within normal limits. Mild diverticular change is noted without evidence of diverticulitis. Vascular/Lymphatic: No pathologically enlarged lymph nodes. No evidence of abdominal aortic aneurysm. Reproductive: No mass or other significant abnormality. Other: Fat containing inguinal hernia is seen. In the right gluteal region medially of there is some inflammatory change and a small amount of subcutaneous air likely representing inflammation. Additionally more superiorly adjacent to the prostate and rectum there are areas of air and fluid identified. The largest of these lies just lateral to the prostate and measures 3.4 x 2.4 x 14.7 cm in greatest AP, transverse and craniocaudad projections respectively. This is best visualized on image number 99 of series 2 as well as images 111 of series 5 and 97 of series 6. These are consistent with small abscesses Musculoskeletal:  No suspicious bone lesions identified. IMPRESSION: Air-fluid collections adjacent to the prostate laterally on the right consistent with abscesses. Smaller collection is noted somewhat more posteriorly adjacent to the distal rectum. Additionally some  inflammatory changes in the right gluteal region medially are seen. Electronically Signed   By: Alcide Clever M.D.   On: 06/10/2016 18:21   Dg Chest Port 1 View  06/10/2016  CLINICAL DATA:  Possible sepsis EXAM: PORTABLE CHEST 1 VIEW COMPARISON:  None. FINDINGS: The heart size is enlarged. There is no focal infiltrate, pulmonary edema, or pleural effusion. The lung volumes are low. Mediastinal contour is unremarkable given the AP semi-erect technique. The visualized skeletal structures are unremarkable. IMPRESSION: Cardiomegaly.  No focal pneumonia. Electronically Signed   By: Sherian Rein M.D.   On: 06/10/2016 16:47    Lab Data:  CBC:  Recent Labs Lab 06/12/16 0234 06/13/16 0230 06/14/16 0549 06/16/16 0241  WBC 7.8 6.1 6.4 6.0  NEUTROABS  --  3.8  --   --   HGB 11.5* 10.8* 11.5* 11.5*  HCT 34.1* 32.6* 34.6* 34.9*  MCV 92.9 92.9 93.0 93.3  PLT 313 312 331 399   Basic Metabolic Panel:  Recent Labs Lab 06/12/16 0234 06/13/16 0230 06/14/16 0549 06/15/16 0308 06/16/16 0241 06/18/16 0544  NA 133* 133* 137  --  136 140  K 3.7 3.5 3.2*  --  3.6 3.6  CL 103 101 100*  --  101 99*  CO2 25 23 29   --  28 32  GLUCOSE 99 87 101*  --  92  93  BUN 6 <5* <5*  --  <5* <5*  CREATININE 0.85 0.78 0.87  --  0.71 0.76  CALCIUM 7.9* 8.2* 8.5*  --  8.8* 9.1  MG 1.8 1.9 1.7 1.7  --  1.8  PHOS 2.7  --   --   --   --   --    GFR: Estimated Creatinine Clearance: 123.7 mL/min (by C-G formula based on Cr of 0.76). Liver Function Tests:  Recent Labs Lab 06/12/16 0234  ALBUMIN 2.0*   No results for input(s): LIPASE, AMYLASE in the last 168 hours. No results for input(s): AMMONIA in the last 168 hours. Coagulation Profile:  Recent Labs Lab 06/16/16 0928  INR 1.70*   Cardiac Enzymes:  Recent Labs Lab 06/11/16 1158 06/11/16 2034  TROPONINI 0.03* 0.03*   BNP (last 3 results) No results for input(s): PROBNP in the last 8760 hours. HbA1C: No results for input(s): HGBA1C in the last  72 hours. CBG: No results for input(s): GLUCAP in the last 168 hours. Lipid Profile: No results for input(s): CHOL, HDL, LDLCALC, TRIG, CHOLHDL, LDLDIRECT in the last 72 hours. Thyroid Function Tests: No results for input(s): TSH, T4TOTAL, FREET4, T3FREE, THYROIDAB in the last 72 hours. Anemia Panel: No results for input(s): VITAMINB12, FOLATE, FERRITIN, TIBC, IRON, RETICCTPCT in the last 72 hours. Urine analysis:    Component Value Date/Time   COLORURINE YELLOW 06/10/2016 1850   APPEARANCEUR CLEAR 06/10/2016 1850   LABSPEC 1.029 06/10/2016 1850   PHURINE 6.0 06/10/2016 1850   GLUCOSEU NEGATIVE 06/10/2016 1850   HGBUR NEGATIVE 06/10/2016 1850   BILIRUBINUR NEGATIVE 06/10/2016 1850   KETONESUR NEGATIVE 06/10/2016 1850   PROTEINUR NEGATIVE 06/10/2016 1850   NITRITE NEGATIVE 06/10/2016 1850   LEUKOCYTESUR NEGATIVE 06/10/2016 1850     Annissa Andreoni M.D. [facility] 06/18/2016, 11:14 AM  Pager: [phone] Between 7am to 7pm - call Pager - [phone]  After 7pm go to [email or web address] - password [email or web address]  Call night coverage person covering after 7pm

## 2016-06-18 NOTE — Progress Notes (Signed)
Patient Name: Justin Sampson Date of Encounter: 06/18/2016   SUBJECTIVE  Language line used. No chest pain or sob.   CURRENT MEDS . antiseptic oral rinse  7 mL Mouth Rinse BID  . carvedilol  6.25 mg Oral BID WC  . cefUROXime  500 mg Oral BID WC  . famotidine  20 mg Oral Daily  . furosemide  80 mg Intravenous BID  . heparin  5,000 Units Subcutaneous Q8H  . lisinopril  5 mg Oral Daily  . sodium chloride flush  3 mL Intravenous Q12H  . sodium chloride flush  3 mL Intravenous Q12H    OBJECTIVE  Filed Vitals:   06/18/16 0600 06/18/16 0700 06/18/16 0800 06/18/16 0914  BP:   118/65 103/70  Pulse: 76 70 63 76  Temp:   98.2 F (36.8 C)   TempSrc:   Oral   Resp: 28 20 19 24   Height:      Weight:      SpO2: 99% 92% 100% 97%    Intake/Output Summary (Last 24 hours) at 06/18/16 0918 Last data filed at 06/18/16 9675  Gross per 24 hour  Intake    600 ml  Output   1300 ml  Net   -700 ml   Filed Weights   06/16/16 0900 06/17/16 0330 06/18/16 0342  Weight: 272 lb 8 oz (123.605 kg) 271 lb 8 oz (123.152 kg) 267 lb 12.8 oz (121.473 kg)    PHYSICAL EXAM  General: Pleasant, NAD. Neuro: Alert and oriented X 3. Moves all extremities spontaneously. Psych: Normal affect. HEENT:  Normal  Neck: Supple without bruits or JVD. Lungs:  Resp regular and unlabored, CTA. Heart: RRR no s3, s4, or murmurs. Abdomen: Soft, non-tender, distended, BS + x 4.  Extremities: No clubbing, cyanosis. 1+ BL LE  edema. DP/PT/Radials 2+ and equal bilaterally.  Accessory Clinical Findings  CBC  Recent Labs  06/16/16 0241  WBC 6.0  HGB 11.5*  HCT 34.9*  MCV 93.3  PLT 399   Basic Metabolic Panel  Recent Labs  06/16/16 0241 06/18/16 0544  NA 136 140  K 3.6 3.6  CL 101 99*  CO2 28 32  GLUCOSE 92 93  BUN <5* <5*  CREATININE 0.71 0.76  CALCIUM 8.8* 9.1  MG  --  1.8   Liver Function Tests No results for input(s): AST, ALT, ALKPHOS, BILITOT, PROT, ALBUMIN in the last 72 hours. No results  for input(s): LIPASE, AMYLASE in the last 72 hours. Cardiac Enzymes No results for input(s): CKTOTAL, CKMB, CKMBINDEX, TROPONINI in the last 72 hours. BNP Invalid input(s): POCBNP D-Dimer No results for input(s): DDIMER in the last 72 hours. Hemoglobin A1C No results for input(s): HGBA1C in the last 72 hours. Fasting Lipid Panel No results for input(s): CHOL, HDL, LDLCALC, TRIG, CHOLHDL, LDLDIRECT in the last 72 hours. Thyroid Function Tests No results for input(s): TSH, T4TOTAL, T3FREE, THYROIDAB in the last 72 hours.  Invalid input(s): FREET3  TELE  Sinus rhythm   Radiology/Studies  Ct Abdomen Pelvis W Contrast  06/10/2016  CLINICAL DATA:  Perirectal pain with focal wound EXAM: CT ABDOMEN AND PELVIS WITH CONTRAST TECHNIQUE: Multidetector CT imaging of the abdomen and pelvis was performed using the standard protocol following bolus administration of intravenous contrast. CONTRAST:  100 mL ISOVUE-300 IOPAMIDOL (ISOVUE-300) INJECTION 61% COMPARISON:  None. FINDINGS: Lower chest:  No acute findings. Hepatobiliary: The liver is mildly fatty infiltrated. The gallbladder is within normal limits. Pancreas: No mass, inflammatory changes, or other significant abnormality. Spleen:  Within normal limits in size and appearance. Adrenals/Urinary Tract: No masses identified. No evidence of hydronephrosis. Small right renal cyst is seen. The bladder is well distended. Stomach/Bowel: No evidence of obstruction, inflammatory process, or abnormal fluid collections. The appendix is within normal limits. Mild diverticular change is noted without evidence of diverticulitis. Vascular/Lymphatic: No pathologically enlarged lymph nodes. No evidence of abdominal aortic aneurysm. Reproductive: No mass or other significant abnormality. Other: Fat containing inguinal hernia is seen. In the right gluteal region medially of there is some inflammatory change and a small amount of subcutaneous air likely representing  inflammation. Additionally more superiorly adjacent to the prostate and rectum there are areas of air and fluid identified. The largest of these lies just lateral to the prostate and measures 3.4 x 2.4 x 14.7 cm in greatest AP, transverse and craniocaudad projections respectively. This is best visualized on image number 99 of series 2 as well as images 111 of series 5 and 97 of series 6. These are consistent with small abscesses Musculoskeletal:  No suspicious bone lesions identified. IMPRESSION: Air-fluid collections adjacent to the prostate laterally on the right consistent with abscesses. Smaller collection is noted somewhat more posteriorly adjacent to the distal rectum. Additionally some inflammatory changes in the right gluteal region medially are seen. Electronically Signed   By: Alcide Clever M.D.   On: 06/10/2016 18:21   Dg Chest Port 1 View  06/10/2016  CLINICAL DATA:  Possible sepsis EXAM: PORTABLE CHEST 1 VIEW COMPARISON:  None. FINDINGS: The heart size is enlarged. There is no focal infiltrate, pulmonary edema, or pleural effusion. The lung volumes are low. Mediastinal contour is unremarkable given the AP semi-erect technique. The visualized skeletal structures are unremarkable. IMPRESSION: Cardiomegaly.  No focal pneumonia. Electronically Signed   By: Sherian Rein M.D.   On: 06/10/2016 16:47    ASSESSMENT AND PLAN  1. Acute systolic CHF/Non-ischemic cardiomyopathy - Echo showed LV EF of 35-40% with hypokinesis of the lateral and inferior myocardium. Cath with normal coronaries. Elevated LVEDP. Continue IV lasix. Net diuresis of negative 1.8L. Weight down 4lb in 24 hours. HF education given on language line.   *Weigh yourself on the same scale at same time of day and keep a log. *Report weight gain of > 2 lbs in 1 day or 5 lbs over the course of a week and/or symptoms of excess fluid (shortness of breath, difficulty lying flat, swelling, poor appetite, abdominal fullness/bloating, etc) to  your doctor immediately. *Avoid foods that are high in sodium (processed, pre-packaged/canned goods, fast foods, etc). *Please attend all scheduled and reccommended follow up appointments    Will need to translate this in spanish at discharge. Encouraged weight lose, daily exercise and heart healthy diet --> the patient agrees to life style change.  - Continue BB and lisinopril.   2. HTN - BP low normal. Continue BB and ACE. Hold for SBP less than 90.   Principal Problem:   Systolic congestive heart failure (HCC) Active Problems:   Gluteal abscess   Hypotension   Essential hypertension   Acute renal insufficiency   Melanotic stools   Sepsis (HCC)   Sepsis, unspecified organism (HCC)   Nonsustained ventricular tachycardia (HCC)   Hypokalemia   Hypomagnesemia   Cardiomyopathy, nonischemic (HCC)   Acute on chronic systolic CHF (congestive heart failure) (HCC)   Obesity hypoventilation syndrome (HCC)   Morbid obesity due to excess calories (HCC)    Signed, Bhagat,Bhavinkumar PA-C Pager [phone]  Pt seen and examined Agree with  findings of B Bhagat. Communication difficult due to language On exam:  Lung Rel clear  Cardiac RRR  Ext Tr edema Pt diuresed 1.8 L   I notice that he has a 1.8 liter bottle of OJ in room  He had one yesterday  ? How much he is drinking   Need to discuss fluid and salt restriction with dietary (and translator) He could still use diuresis  Continue IV lasix  Check BNP   Dietrich Pates

## 2016-06-19 ENCOUNTER — Encounter (HOSPITAL_COMMUNITY): Payer: Self-pay | Admitting: Pulmonary Disease

## 2016-06-19 ENCOUNTER — Inpatient Hospital Stay (HOSPITAL_COMMUNITY): Payer: MEDICAID

## 2016-06-19 ENCOUNTER — Inpatient Hospital Stay (HOSPITAL_COMMUNITY): Payer: Self-pay

## 2016-06-19 DIAGNOSIS — I2699 Other pulmonary embolism without acute cor pulmonale: Secondary | ICD-10-CM

## 2016-06-19 DIAGNOSIS — J9601 Acute respiratory failure with hypoxia: Secondary | ICD-10-CM

## 2016-06-19 LAB — HEPARIN LEVEL (UNFRACTIONATED): Heparin Unfractionated: 0.32 IU/mL (ref 0.30–0.70)

## 2016-06-19 LAB — TROPONIN I
Troponin I: <0.03 ng/mL (ref <0.03)
Troponin I: <0.03 ng/mL (ref <0.03)

## 2016-06-19 LAB — BRAIN NATRIURETIC PEPTIDE: B Natriuretic Peptide: 30.6 pg/mL (ref 0.0–100.0)

## 2016-06-19 MED ORDER — FUROSEMIDE 10 MG/ML IJ SOLN: 40.0000 mg | Freq: Once | INTRAMUSCULAR | Status: DC

## 2016-06-19 MED ORDER — HEPARIN BOLUS VIA INFUSION
5000.0000 [IU] | Freq: Once | INTRAVENOUS | Status: AC
  Administered 2016-06-19: 5000 [IU] via INTRAVENOUS
  Filled 2016-06-19: qty 5000

## 2016-06-19 MED ORDER — HEPARIN (PORCINE) IN NACL 100-0.45 UNIT/ML-% IJ SOLN
1850.0000 [IU]/h | INTRAMUSCULAR | Status: AC
  Administered 2016-06-19: 1500 [IU]/h via INTRAVENOUS
  Administered 2016-06-20: 1850 [IU]/h via INTRAVENOUS
  Filled 2016-06-19 (×2): qty 250

## 2016-06-19 MED ORDER — IOPAMIDOL (ISOVUE-370) INJECTION 76%
INTRAVENOUS | Status: AC
  Administered 2016-06-19: 100 mL
  Filled 2016-06-19: qty 100

## 2016-06-19 NOTE — Progress Notes (Signed)
Subjective: No complaints  Objective: Vital signs in last 24 hours: Temp:  [97.7 F (36.5 C)-98.8 F (37.1 C)] 97.7 F (36.5 C) (07/20 0826) Pulse Rate:  [69-90] 89 (07/20 0745) Resp:  [20-25] 20 (07/20 0421) BP: (90-124)/(29-80) 124/80 mmHg (07/20 0746) SpO2:  [89 %-98 %] 98 % (07/20 0421) Weight:  [265 lb 3.2 oz (120.294 kg)] 265 lb 3.2 oz (120.294 kg) (07/20 0421) Weight change: -2 lb 9.6 oz (-1.179 kg) Last BM Date: 06/15/16 Intake/Output from previous day: +20 07/19 0701 - 07/20 0700 In: 720 [P.O.:720] Out: 700 [Urine:700] Intake/Output this shift:    PE: General:Pleasant affect, NAD Skin:Warm and dry, brisk capillary refill HEENT:normocephalic, sclera clear, mucus membranes moist Neck:supple, no JVD Heart:S1S2 RRR without murmur, gallup, rub or click Lungs:clear without rales, rhonchi, or wheezes WUJ:WJXBJ, soft, non tender, + BS, do not palpate liver spleen or masses Ext:no to trace lower ext edema, 2+ pedal pulses, 2+ radial pulses Neuro:alert and oriented X 3, MAE, follows commands, + facial symmetry Tele: SR with occ PVCs    Lab Results: No results for input(s): WBC, HGB, HCT, PLT in the last 72 hours. BMET  Recent Labs  06/18/16 0544  NA 140  K 3.6  CL 99*  CO2 32  GLUCOSE 93  BUN <5*  CREATININE 0.76  CALCIUM 9.1   No results for input(s): TROPONINI in the last 72 hours.  Invalid input(s): CK, MB  Lab Results  Component Value Date   CHOL 109 06/14/2016   HDL 31* 06/14/2016   LDLCALC 63 06/14/2016   TRIG 76 06/14/2016   CHOLHDL 3.5 06/14/2016   Lab Results  Component Value Date   HGBA1C 5.8* 06/13/2016     Lab Results  Component Value Date   TSH 0.581 06/13/2016      Studies/Results: No results found.  Medications: I have reviewed the patient's current medications. Scheduled Meds: . antiseptic oral rinse  7 mL Mouth Rinse BID  . carvedilol  6.25 mg Oral BID WC  . cefUROXime  500 mg Oral BID WC  . famotidine   20 mg Oral Daily  . furosemide  80 mg Intravenous BID  . heparin  5,000 Units Subcutaneous Q8H  . lisinopril  5 mg Oral Daily  . sodium chloride flush  3 mL Intravenous Q12H  . sodium chloride flush  3 mL Intravenous Q12H   Continuous Infusions:  PRN Meds:.sodium chloride, sodium chloride, [DISCONTINUED] acetaminophen **OR** acetaminophen, acetaminophen, ondansetron (ZOFRAN) IV, ondansetron **OR** [DISCONTINUED] ondansetron (ZOFRAN) IV, oxyCODONE, sodium chloride flush, sodium chloride flush  Assessment/Plan: Principal Problem:   Systolic congestive heart failure (HCC) Active Problems:   Cardiomyopathy, nonischemic (HCC)   Gluteal abscess   Hypotension   Essential hypertension   Acute renal insufficiency   Melanotic stools   Sepsis (HCC)   Sepsis, unspecified organism (HCC)   Nonsustained ventricular tachycardia (HCC)   Hypokalemia   Hypomagnesemia   Acute on chronic systolic CHF (congestive heart failure) (HCC)   Obesity hypoventilation syndrome (HCC)   Morbid obesity due to excess calories (HCC)   NICM with acute systolic HF -1783 since admit.  BNP today 38---on coreg 6.25 BID, lasix 80 IV BID ACE 5 mg daily.  BP lower yesterday ? Change to po lasix today.    Cardiac cath with no significant CAD.  Mildly elevated LDP   LOS: 9 days   Time spent with pt. :15 minutes. Nada Boozer  Nurse Practitioner Certified Pager [phone] or after  5pm and on weekends call [phone] 06/19/2016, 8:45 AM   Patient seen and examined  Agree with findings as noted by L Ingold VERY difficult historian  DOes not answer questions  DOes speak english Exam:  Large pt  Lungs Rel clear  Rales at L base . Cardiac RRR  No S3 Ext with Tr edema   He has diuresed over the past few days  Keeps OJ container in room  I do not know what he is drinking   Spoke with Dr Isidoro Donning.  Agree with plan for CT angiogram to r/o PE given marginal oxygenation.    Dietrich Pates

## 2016-06-19 NOTE — Progress Notes (Signed)
06/19/16   Pharmacy- Heparin 2230   Heparin level 0.32   A/P:  60yo male with (+)PE on CTA, initial heparin level is therapeutic.  Per discussion with RN, there have been no issues with the IV or signs of bleeding.  I will increase heparin rate some to assure maintains goal range.   1-  Increase heparin to 1600 units/hr 2-  F/U AM labs 3-  Monitor for s/s of bleeding    Marisue Humble, PharmD Clinical Pharmacist [facility]

## 2016-06-19 NOTE — Progress Notes (Signed)
Received nutrition consult for CHF diet education. RD provided education on 7/18. No further needs at this time.  Joaquin Courts, RD, LDN, CNSC Pager [phone] After Hours Pager [phone]

## 2016-06-19 NOTE — Progress Notes (Addendum)
[facility]                                                                              Patient Demographics  Justin Sampson, is a 60 y.o. male, DOB - [birth year], ZOX:[id number]  Admit date - 06/10/2016   Admitting Physician Delano Metz, MD  Outpatient Primary MD for the patient is No primary care provider on file.  Outpatient specialists:   LOS - 9  days    Chief Complaint  Patient presents with  . Wound Infection       Brief summary   60 y.o. HM SPANISH SPEAKING ONLY PMHx HTN and obesity who presented to ED with "two sores on his bottom", very painful and weren't healing. In ED pt was hypotensive. ED MD noted black stool on rectal exam and started a PPI drip. CT pelvis showed gluteal abscess tracking towards the prostate with soft-tissue gas. He was taken to the OR by Dr Corliss Skains with drainage/debridement of a large abscess.      Assessment & Plan    Right gluteal abscess  -7/11 S/P I&D and wound care per Gen Surgery -zosyn discontinued on 7/15 and following cultures results patient started on ceftin. wound cultures showed Escherichia coli, strep viridans  -vancomycin discontinued on 7/14 -continue supportive care and increase activity.   - Blood cultures negative, sepsis ruled out.  - Wound inspected today, no penrose drain noted, with serosanguineous discharge, requested surgery for revaluation  Acute on Chronic systolic heart failure: but new diagnosis for him  -per Echo EF 35-40%, Cardiac cath with normal coronaries  -Cardiology following, continue IV Lasix for diuresis, negative balance of 2.28 L - Heart healthy, low-sodium diet, patient drinking orange juice and Gatorade in the room. Nutrition consult also placed, discussed with the patient. - ADDENDUM 3:00PM I had discussed in detail with Dr. Tenny Craw this morning, who was also perplexed that patient continued to be hypoxic despite IV diuresis, on my encounter this morning patient was on NRB mask (per  staff, was placed last night due to sleep apnea and hypoxia), then was weaned to 2 L with marginal sats. CT angiogram of the chest showed significant pulmonary emboli involving the distal right main pulmonary artery extending into the branches in the right upper lobe, right lower lobe, wedge shaped parenchymal opacity in the posterior medial right lower lobe suspicious for infarction. - start IV heparin drip per pharmacy - will check stat BNP, troponin. Discussed with Dr. Gery Pray (radiology) who spoke with Dr. Ruthe Mannan IR, who did not feel that thrombolysis would be helpful as there is no evidence of right heart strain. I will recheck 2-D echocardiogram again, previously done on 7/13. - Pulmonary consult also called, discussed with Dr. Gevena Cotton. Notified Dr Tenny Craw.    Hx of hypertension: presented with Hypotension  -BP low normal , On beta blocker, lisinopril   Melena?- Normocytic anemia  -black stool according to ED MD, no clear evidence of active bleeding appreciated during hospitalization -follow Hgb trend; current "drop" likely due to aggressive volume expansion and blood loss from during surgery; instead of GIB  -will continue pepcid, H&H stable   Renal insuff -  Resolved With IVF's -renal function WNL now  NSVT -Resolved, keep magnesium around 2, potassium around 4 -Echocardiogram demonstrating EF 35-40% and some wall motion abnormalities   Hypomagnesemia, Hypomagnesemia replaced   Morbid Obesity -  -Body mass index is 46.21 kg/(m^2). -low calorie diet and exercise discussed with patient   GERD -will continue famotidine  Presumed OSA -will need outpatient sleep study and most likely CPAP at bedtime  Acute resp failure with hypoxia: Improving, likely due to mild CHF exacerbation and received IV fluids   Code Status: Full CODE STATUS DVT Prophylaxis:   SCD's Family Communication: Discussed in detail with the patient, all imaging results, lab results explained to the patient    Disposition Plan:  When cleared by cardiology   Time Spent in minutes  25 minutes  Procedures:  7/11 I&D large R gluteal abscess in OR   2-D Echo: - Left ventricle: The cavity size was normal. Systolic function was  moderately reduced. The estimated ejection fraction was in the  range of 35% to 40%. Hypokinesis of the lateral and inferior  myocardium. - Left atrium: The atrium was moderately dilated.  Left Heart cath: 7/17  Consultants:   Cardiology   Antimicrobials:   Zosyn 7/11 >7/14 Vanc 7/11 >7/15 ceftin 7/15   Medications  Scheduled Meds: . antiseptic oral rinse  7 mL Mouth Rinse BID  . carvedilol  6.25 mg Oral BID WC  . cefUROXime  500 mg Oral BID WC  . famotidine  20 mg Oral Daily  . furosemide  80 mg Intravenous BID  . heparin  5,000 Units Subcutaneous Q8H  . lisinopril  5 mg Oral Daily  . sodium chloride flush  3 mL Intravenous Q12H  . sodium chloride flush  3 mL Intravenous Q12H   Continuous Infusions:  PRN Meds:.sodium chloride, sodium chloride, [DISCONTINUED] acetaminophen **OR** acetaminophen, acetaminophen, ondansetron (ZOFRAN) IV, ondansetron **OR** [DISCONTINUED] ondansetron (ZOFRAN) IV, oxyCODONE, sodium chloride flush, sodium chloride flush   Antibiotics   Anti-infectives    Start     Dose/Rate Route Frequency Ordered Stop   06/14/16 1700  cefUROXime (CEFTIN) tablet 500 mg     500 mg Oral 2 times daily with meals 06/14/16 1458     06/11/16 1000  vancomycin (VANCOCIN) IVPB 1000 mg/200 mL premix  Status:  Discontinued     1,000 mg 200 mL/hr over 60 Minutes Intravenous Every 12 hours 06/11/16 0915 06/13/16 1400   06/11/16 0700  vancomycin (VANCOCIN) IVPB 750 mg/150 ml premix  Status:  Discontinued     750 mg 150 mL/hr over 60 Minutes Intravenous Every 12 hours 06/10/16 1721 06/11/16 0915   06/10/16 2300  piperacillin-tazobactam (ZOSYN) IVPB 3.375 g  Status:  Discontinued     3.375 g 12.5 mL/hr over 240 Minutes Intravenous Every 8 hours  06/10/16 1721 06/14/16 1458   06/10/16 1745  vancomycin (VANCOCIN) IVPB 1000 mg/200 mL premix     1,000 mg 200 mL/hr over 60 Minutes Intravenous  Once 06/10/16 1632 06/10/16 1839   06/10/16 1630  piperacillin-tazobactam (ZOSYN) IVPB 3.375 g     3.375 g 100 mL/hr over 30 Minutes Intravenous  Once 06/10/16 1624 06/10/16 1814   06/10/16 1630  vancomycin (VANCOCIN) IVPB 1000 mg/200 mL premix     1,000 mg 200 mL/hr over 60 Minutes Intravenous  Once 06/10/16 1624 06/10/16 1945        Subjective:   Guss Farruggia was seen and examined today. Overall improving, at night desats due to sleep apnea. Shortness of breath  improving. No chest pain.  Patient denies dizziness, abdominal pain, N/V/D/C, new weakness, numbess, tingling. No acute events overnight.    Objective:   Filed Vitals:   06/19/16 0421 06/19/16 0745 06/19/16 0746 06/19/16 0826  BP: 108/78  124/80   Pulse: 90 89    Temp: 98.1 F (36.7 C)   97.7 F (36.5 C)  TempSrc: Oral   Oral  Resp: 20     Height:      Weight: 120.294 kg (265 lb 3.2 oz)     SpO2: 98%       Intake/Output Summary (Last 24 hours) at 06/19/16 1036 Last data filed at 06/19/16 0953  Gross per 24 hour  Intake    480 ml  Output   1200 ml  Net   -720 ml     Wt Readings from Last 3 Encounters:  06/19/16 120.294 kg (265 lb 3.2 oz)     Exam  General: Alert and oriented x 3, NAD  HEENT:   Neck: Supple, no JVD, no masses  Cardiovascular: S1 S2 auscultated, no rubs, murmurs or gallops. RRR  Respiratory: Clear to auscultation bilaterally, no wheezing, rales or rhonchi  Gastrointestinal: Soft, nontender, nondistended, + bowel sounds  Ext: no cyanosis clubbing, 1+ edema  Neuro: no new deficits  Skin:  Large open wound in gluteal region, no Penrose drain, serosanguineous discharge, soiled dressing   psych: Normal affect and demeanor, alert and oriented x3    Data Reviewed:  I have personally reviewed following labs and imaging studies  Micro  Results Recent Results (from the past 240 hour(s))  Aerobic Culture (superficial specimen)     Status: None   Collection Time: 06/10/16  4:26 PM  Result Value Ref Range Status   Specimen Description WOUND  Final   Special Requests RECTUM  Final   Gram Stain   Final    ABUNDANT WBC PRESENT, PREDOMINANTLY PMN ABUNDANT GRAM VARIABLE ROD MODERATE GRAM POSITIVE COCCI IN PAIRS    Culture   Final    RARE ESCHERICHIA COLI SUSCEPTIBILITIES PERFORMED ON PREVIOUS CULTURE WITHIN THE LAST 5 DAYS. FEW VIRIDANS STREPTOCOCCUS    Report Status 06/14/2016 FINAL  Final  Blood Culture (routine x 2)     Status: None   Collection Time: 06/10/16  4:39 PM  Result Value Ref Range Status   Specimen Description BLOOD RIGHT HAND  Final   Special Requests BOTTLES DRAWN AEROBIC AND ANAEROBIC 5CC  Final   Culture NO GROWTH 5 DAYS  Final   Report Status 06/15/2016 FINAL  Final  Blood Culture (routine x 2)     Status: None   Collection Time: 06/10/16  4:56 PM  Result Value Ref Range Status   Specimen Description BLOOD RIGHT HAND  Final   Special Requests BOTTLES DRAWN AEROBIC ONLY 5CC  Final   Culture NO GROWTH 5 DAYS  Final   Report Status 06/15/2016 FINAL  Final  Urine culture     Status: None   Collection Time: 06/10/16  6:50 PM  Result Value Ref Range Status   Specimen Description URINE, CLEAN CATCH  Final   Special Requests NONE  Final   Culture NO GROWTH  Final   Report Status 06/12/2016 FINAL  Final  Aerobic/Anaerobic Culture (surgical/deep wound)     Status: None   Collection Time: 06/10/16  9:18 PM  Result Value Ref Range Status   Specimen Description ABSCESS  Final   Special Requests RT GLUTEAL PT ON VANC  Final   Gram Stain  Final    RARE WBC PRESENT, PREDOMINANTLY PMN FEW GRAM POSITIVE COCCI IN PAIRS RARE GRAM VARIABLE ROD FEW SQUAMOUS EPITHELIAL CELLS PRESENT    Culture   Final    MODERATE VIRIDANS STREPTOCOCCUS FEW ESCHERICHIA COLI MIXED ANAEROBIC FLORA PRESENT.  CALL LAB IF  FURTHER IID REQUIRED.    Report Status 06/14/2016 FINAL  Final   Organism ID, Bacteria ESCHERICHIA COLI  Final      Susceptibility   Escherichia coli - MIC*    AMPICILLIN >=32 RESISTANT Resistant     CEFAZOLIN <=4 SENSITIVE Sensitive     CEFEPIME <=1 SENSITIVE Sensitive     CEFTAZIDIME <=1 SENSITIVE Sensitive     CEFTRIAXONE <=1 SENSITIVE Sensitive     CIPROFLOXACIN <=0.25 SENSITIVE Sensitive     GENTAMICIN <=1 SENSITIVE Sensitive     IMIPENEM <=0.25 SENSITIVE Sensitive     TRIMETH/SULFA <=20 SENSITIVE Sensitive     AMPICILLIN/SULBACTAM 16 INTERMEDIATE Intermediate     PIP/TAZO <=4 SENSITIVE Sensitive     * FEW ESCHERICHIA COLI  MRSA PCR Screening     Status: None   Collection Time: 06/10/16 11:51 PM  Result Value Ref Range Status   MRSA by PCR NEGATIVE NEGATIVE Final    Comment:        The GeneXpert MRSA Assay (FDA approved for NASAL specimens only), is one component of a comprehensive MRSA colonization surveillance program. It is not intended to diagnose MRSA infection nor to guide or monitor treatment for MRSA infections.     Radiology Reports Ct Abdomen Pelvis W Contrast  06/10/2016  CLINICAL DATA:  Perirectal pain with focal wound EXAM: CT ABDOMEN AND PELVIS WITH CONTRAST TECHNIQUE: Multidetector CT imaging of the abdomen and pelvis was performed using the standard protocol following bolus administration of intravenous contrast. CONTRAST:  100 mL ISOVUE-300 IOPAMIDOL (ISOVUE-300) INJECTION 61% COMPARISON:  None. FINDINGS: Lower chest:  No acute findings. Hepatobiliary: The liver is mildly fatty infiltrated. The gallbladder is within normal limits. Pancreas: No mass, inflammatory changes, or other significant abnormality. Spleen: Within normal limits in size and appearance. Adrenals/Urinary Tract: No masses identified. No evidence of hydronephrosis. Small right renal cyst is seen. The bladder is well distended. Stomach/Bowel: No evidence of obstruction, inflammatory process,  or abnormal fluid collections. The appendix is within normal limits. Mild diverticular change is noted without evidence of diverticulitis. Vascular/Lymphatic: No pathologically enlarged lymph nodes. No evidence of abdominal aortic aneurysm. Reproductive: No mass or other significant abnormality. Other: Fat containing inguinal hernia is seen. In the right gluteal region medially of there is some inflammatory change and a small amount of subcutaneous air likely representing inflammation. Additionally more superiorly adjacent to the prostate and rectum there are areas of air and fluid identified. The largest of these lies just lateral to the prostate and measures 3.4 x 2.4 x 14.7 cm in greatest AP, transverse and craniocaudad projections respectively. This is best visualized on image number 99 of series 2 as well as images 111 of series 5 and 97 of series 6. These are consistent with small abscesses Musculoskeletal:  No suspicious bone lesions identified. IMPRESSION: Air-fluid collections adjacent to the prostate laterally on the right consistent with abscesses. Smaller collection is noted somewhat more posteriorly adjacent to the distal rectum. Additionally some inflammatory changes in the right gluteal region medially are seen. Electronically Signed   By: Alcide Clever M.D.   On: 06/10/2016 18:21   Dg Chest Port 1 View  06/10/2016  CLINICAL DATA:  Possible sepsis EXAM: PORTABLE CHEST  1 VIEW COMPARISON:  None. FINDINGS: The heart size is enlarged. There is no focal infiltrate, pulmonary edema, or pleural effusion. The lung volumes are low. Mediastinal contour is unremarkable given the AP semi-erect technique. The visualized skeletal structures are unremarkable. IMPRESSION: Cardiomegaly.  No focal pneumonia. Electronically Signed   By: Sherian Rein M.D.   On: 06/10/2016 16:47    Lab Data:  CBC:  Recent Labs Lab 06/13/16 0230 06/14/16 0549 06/16/16 0241  WBC 6.1 6.4 6.0  NEUTROABS 3.8  --   --   HGB  10.8* 11.5* 11.5*  HCT 32.6* 34.6* 34.9*  MCV 92.9 93.0 93.3  PLT 312 331 399   Basic Metabolic Panel:  Recent Labs Lab 06/13/16 0230 06/14/16 0549 06/15/16 0308 06/16/16 0241 06/18/16 0544  NA 133* 137  --  136 140  K 3.5 3.2*  --  3.6 3.6  CL 101 100*  --  101 99*  CO2 23 29  --  28 32  GLUCOSE 87 101*  --  92 93  BUN <5* <5*  --  <5* <5*  CREATININE 0.78 0.87  --  0.71 0.76  CALCIUM 8.2* 8.5*  --  8.8* 9.1  MG 1.9 1.7 1.7  --  1.8   GFR: Estimated Creatinine Clearance: 123 mL/min (by C-G formula based on Cr of 0.76). Liver Function Tests: No results for input(s): AST, ALT, ALKPHOS, BILITOT, PROT, ALBUMIN in the last 168 hours. No results for input(s): LIPASE, AMYLASE in the last 168 hours. No results for input(s): AMMONIA in the last 168 hours. Coagulation Profile:  Recent Labs Lab 06/16/16 0928  INR 1.70*   Cardiac Enzymes: No results for input(s): CKTOTAL, CKMB, CKMBINDEX, TROPONINI in the last 168 hours. BNP (last 3 results) No results for input(s): PROBNP in the last 8760 hours. HbA1C: No results for input(s): HGBA1C in the last 72 hours. CBG: No results for input(s): GLUCAP in the last 168 hours. Lipid Profile: No results for input(s): CHOL, HDL, LDLCALC, TRIG, CHOLHDL, LDLDIRECT in the last 72 hours. Thyroid Function Tests: No results for input(s): TSH, T4TOTAL, FREET4, T3FREE, THYROIDAB in the last 72 hours. Anemia Panel: No results for input(s): VITAMINB12, FOLATE, FERRITIN, TIBC, IRON, RETICCTPCT in the last 72 hours. Urine analysis:    Component Value Date/Time   COLORURINE YELLOW 06/10/2016 1850   APPEARANCEUR CLEAR 06/10/2016 1850   LABSPEC 1.029 06/10/2016 1850   PHURINE 6.0 06/10/2016 1850   GLUCOSEU NEGATIVE 06/10/2016 1850   HGBUR NEGATIVE 06/10/2016 1850   BILIRUBINUR NEGATIVE 06/10/2016 1850   KETONESUR NEGATIVE 06/10/2016 1850   PROTEINUR NEGATIVE 06/10/2016 1850   NITRITE NEGATIVE 06/10/2016 1850   LEUKOCYTESUR NEGATIVE 06/10/2016  1850     Akeyla Molden M.D. [facility] 06/19/2016, 10:36 AM  Pager: [phone] Between 7am to 7pm - call Pager - [phone]  After 7pm go to [email or web address] - password [email or web address]  Call night coverage person covering after 7pm

## 2016-06-19 NOTE — Progress Notes (Signed)
ANTICOAGULATION CONSULT NOTE - Initial Consult  Pharmacy Consult for heparin Indication: pulmonary embolus  No Known Allergies  Patient Measurements: Height: 5\' 6"  (167.6 cm) Weight: 265 lb 3.2 oz (120.294 kg) IBW/kg (Calculated) : 63.8 Heparin Dosing Weight: 92 kg  Vital Signs: Temp: 98.3 F (36.8 C) (07/20 1238) Temp Source: Oral (07/20 1238) BP: 106/65 mmHg (07/20 1238) Pulse Rate: 62 (07/20 1238)  Labs:  Recent Labs  06/18/16 0544  CREATININE 0.76    Estimated Creatinine Clearance: 123 mL/min (by C-G formula based on Cr of 0.76).   Medical History: Past Medical History  Diagnosis Date  . Hypertension   . Snoring   . Obesities, morbid (HCC)   . Cardiomyopathy, nonischemic (HCC) 06/17/2016    Medications:  See EMR   Assessment: 60 yo male admitted with gluteal abscess s/p I&D over a week ago. He was also having SOB and some systolic dysfunction. He had a cath which showed no significant CAD. He also had a CTA which was positive for a PE. Will initiate heparin. SCr 0.7, h/h 11/34, plts wnl.   Goal of Therapy:  Heparin level 0.3-0.7 units/ml Monitor platelets by anticoagulation protocol: Yes     Plan:  -Heparin bolus 5000 units x1 then 1500 units/hr -Daily HL, CBC -Monitor s/sx bleeding -Check first level this evening   Jessie Cowher, Darl Householder 06/19/2016,2:57 PM

## 2016-06-19 NOTE — Consult Note (Addendum)
Name: Justin Sampson MRN: [id number] DOB: November 14, 1956    ADMISSION DATE:  06/10/2016 CONSULTATION DATE:  06/19/2016  REFERRING MD :  Thad Ranger, M.D. / [facility]  CHIEF COMPLAINT:  Acute Pulmonary Embolism  SIGNIFICANT EVENTS  7/11 - Admit w/ I&D of gluteal abscess 7/17 - LHC (no significant CAD & EF 25-35% by visual estimate w/ global hypokinesis but no aortic valve stenosis) 7/20 - Consult w/ PE  STUDIES:  TTE 7/13: LVEF 35-40% with hypokinesis of the lateral & inferior myocardium. Moderately dilated left atrium. Aortic valve not well visualized but no apparent stenosis or regurgitation. No mitral stenosis or regurgitation. RV normal in size and function. Pulmonary artery normal in size. IVC dilated. CTA CHEST 7/20: Large pulmonary embolism extending from distal right main pulmonary artery into right upper lobe and lower lobe branches with partial occlusion. No definite left pulmonary emboli noted. RV/LV ratio within normal limits without evidence of right heart strain. Bibasilar atelectasis. Medial peripheral right lower lobe opacity suspicious for pulmonary infarction. Some mosaic perfusion as well. No nodule or mass appreciated. No pathologic mediastinal adenopathy.  HISTORY OF PRESENT ILLNESS:  History obtained through the use of a phone translating service. Patient with past medical history for hypertension was admitted on 7/11 with painful and nonhealing ulcers on his bottom. Patient was hypotensive initially in the emergency department and was found to have "black" stool on rectal exam. Patient was subsequently found to have a gluteal abscess and was taken to the operating room by general surgery for drainage and debridement. Antibiotics were transitioned from Zosyn to Ceftin with culture data and vancomycin was ultimately discontinued. Patient had persistent hypoxia despite diuresis for newly found systolic congestive heart failure. Ultimately patient underwent CT angiogram of the chest which  revealed a right-sided pulmonary embolism but no evidence of right heart strain. Patient reports he has no history of clots. No clear family history of clots either. Has had questionable dyspnea recently. Has noted bilateral lower extremity edema right greater than left as well. Reports some mild leg discomfort. Denies any chest pain, pressure, or tightness. Denies any syncope or near syncope. Questionable subjective fever but no chills or sweats. No appreciated lymphadenopathy. No weight loss. Denies any rashes or abnormal bruising. Patient reports he has been more immobile than usual lately at home.  PAST MEDICAL HISTORY :  Past Medical History  Diagnosis Date  . Hypertension   . Snoring   . Obesities, morbid (HCC)   . Cardiomyopathy, nonischemic (HCC) 06/17/2016   PAST SURGICAL HISTORY: Past Surgical History  Procedure Laterality Date  . Incision and drainage abscess Right 06/10/2016    Procedure: INCISION AND DRAINAGE GLUTEAL ABSCESS;  Surgeon: Manus Rudd, MD;  Location: [facility] OR;  Service: General;  Laterality: Right;  . Cardiac catheterization N/A 06/16/2016    Procedure: Left Heart Cath and Coronary Angiography;  Surgeon: Corky Crafts, MD;  Location: [facility] INVASIVE CV LAB;  Service: Cardiovascular;  Laterality: N/A;    Prior to Admission medications   Medication Sig Start Date End Date Taking? Authorizing Provider  naproxen (NAPROSYN) 500 MG tablet Take 500 mg by mouth daily as needed. 10/20/15  Yes Historical Provider, MD  losartan-hydrochlorothiazide (HYZAAR) 100-12.5 MG tablet Take 1 tablet by mouth daily.    Historical Provider, MD   No Known Allergies  FAMILY HISTORY:  Family History  Problem Relation Age of Onset  . Diabetes Mother   . Cancer Neg Hx     SOCIAL HISTORY: Social History   Social  History  . Marital Status: Single    Spouse Name: N/A  . Number of Children: N/A  . Years of Education: N/A   Social History Main Topics  . Smoking status: Never Smoker     . Smokeless tobacco: None  . Alcohol Use: 0.0 oz/week    0 Standard drinks or equivalent per week  . Drug Use: No  . Sexual Activity: Not Asked   Other Topics Concern  . None   Social History Narrative    REVIEW OF SYSTEMS:  No dysuria or hematuria. Patient reports he has been having morning headaches that are over his bed with a resolved within 1 hour. Denies any other focal weakness, numbness, or tingling. A pertinent 14 point review of systems is negative except as per the history of presenting illness.  SUBJECTIVE: As above.  VITAL SIGNS: Temp:  [97.7 F (36.5 C)-98.8 F (37.1 C)] 98.3 F (36.8 C) (07/20 1238) Pulse Rate:  [62-90] 90 (07/20 1615) Resp:  [20-25] 20 (07/20 0421) BP: (90-124)/(29-80) 107/78 mmHg (07/20 1615) SpO2:  [89 %-100 %] 98 % (07/20 1615) Weight:  [265 lb 3.2 oz (120.294 kg)] 265 lb 3.2 oz (120.294 kg) (07/20 0421)  PHYSICAL EXAMINATION: General:  Awake. Alert. No acute distress.   Integument:  Warm & dry. No rash on exposed skin. No bruising. Lymphatics:  No appreciated cervical or supraclavicular lymphadenoapthy. HEENT:  Moist mucus membranes. No oral ulcers. No scleral injection or icterus.  Cardiovascular:  Regular rate. No edema. No appreciable JVD given body habitus.  Pulmonary:  Diminished breath sounds bilateral lung bases. Symmetric chest wall expansion. No accessory muscle use on nasal cannula oxygen. Abdomen: Soft. Normal bowel sounds. Protuberant. Grossly nontender. Musculoskeletal:  Normal bulk and tone. Hand grip strength 5/5 bilaterally. No joint deformity or effusion appreciated. Neurological:  CN 2-12 grossly in tact. No meningismus. Moving all 4 extremities equally. Symmetric brachioradialis deep tendon reflexes. Psychiatric:  Mood and affect congruent. Speech normal rhythm, rate & tone.    Recent Labs Lab 06/14/16 0549 06/16/16 0241 06/18/16 0544  NA 137 136 140  K 3.2* 3.6 3.6  CL 100* 101 99*  CO2 29 28 32  BUN <5* <5*  <5*  CREATININE 0.87 0.71 0.76  GLUCOSE 101* 92 93    Recent Labs Lab 06/13/16 0230 06/14/16 0549 06/16/16 0241  HGB 10.8* 11.5* 11.5*  HCT 32.6* 34.6* 34.9*  WBC 6.1 6.4 6.0  PLT 312 331 399   Ct Angio Chest Pe W Or Wo Contrast  06/19/2016  ADDENDUM REPORT: 06/19/2016 14:57 ADDENDUM: I discussed the findings of this CTA chest with Dr. Isidoro Donning at 2:48 p.m. Also I had Dr. Archer Asa review the case and Interventional Radiology would not be helpful since there is no evidence of right heart strain present. Electronically Signed   By: Dwyane Dee M.D.   On: 06/19/2016 14:57  06/19/2016  CLINICAL DATA:  Mid chest tightness, some shortness of breath EXAM: CT ANGIOGRAPHY CHEST WITH CONTRAST TECHNIQUE: Multidetector CT imaging of the chest was performed using the standard protocol during bolus administration of intravenous contrast. Multiplanar CT image reconstructions and MIPs were obtained to evaluate the vascular anatomy. CONTRAST:  100 cc Isovue 370 COMPARISON:  Portable chest x-ray of 06/10/2016 FINDINGS: There is a large pulmonary embolus which extends from the distal main right pulmonary artery origin into the right upper lobe and right lower lobe branches partially occluding branches to both the right upper lobe and right lower lobe segments. No definite pulmonary emboli  are seen involving the left pulmonary artery and its branches. The RV over LV ratio is within normal limits, with no evidence of right heart strain. The thoracic aorta is not well opacified but no acute abnormality is seen. The mid ascending thoracic aorta measures 32 mm in diameter. There is a large amount of epicardial fat present. No mediastinal or hilar adenopathy is seen. On images through the upper abdomen there may be a tiny faintly calcified gallstone layering dependently within the gallbladder. The thyroid gland is unremarkable. On lung window images, there is bibasilar atelectasis present. Within the posterior medial right  lower lobe there is peripheral parenchymal opacity suspicious for a focus of pulmonary infarction in view of the degree of largely occlusive pulmonary embolism to that segment. Some mosaic profusion also is present as a result of the vascular abnormality. No lung nodule or mass is seen. The central airway is patent. There are degenerative changes throughout the mid to lower thoracic spine. Review of the MIP images confirms the above findings. IMPRESSION: 1. Significant pulmonary emboli involving the distal right main pulmonary artery extending into branches to the right upper lobe and right lower lobe with partial occlusion to both the right upper lobe and lower lobe branches. 2. Peripheral wedge-shaped parenchymal opacity in the posterior medial right lower lobe suspicious for a focus of infarction in view of the degree of partially occlusive pulmonary emboli to that segment. Electronically Signed: By: Dwyane Dee M.D. On: 06/19/2016 14:45    ASSESSMENT / PLAN:  60 year old male with sepsis secondary to gluteal abscess status post I&D with incidentally found systolic congestive heart failure and persistent hypoxic respiratory failure despite diuresis. Patient incidentally found to have right-sided pulmonary embolism without evidence of right ventricular dysfunction. Suspect patient's pulmonary embolism is secondary to his recent immobility. No evidence of hemodynamic instability to suggest cor pulmonale or indicating need for lytic therapy. Patient does have hypoxia which is currently being treated but at present is not tachycardic.  1. Right Pulmonary Embolism: Agree with continuing systemic anticoagulation with heparin infusion. Awaiting cardiac biomarkers. Awaiting repeat transthoracic echocardiogram. Agree with interventional radiology that there is no indication for IV or catheter directed lytic therapy. Patient counseled to notify nurse at bedside if he develops any chest discomfort, persistent headache,  near syncope, persistent abdominal pain, hematuria, or hematochezia given the risk of bleeding with systemic anticoagulation. 2. Acute Hypoxic Respiratory Failure: Likely secondary to acute pulmonary embolism. Recommend continuing to wean FiO2 for saturation greater than 92%. 3. Sepsis Secondary to Gluteal Abscess: Management per primary service. 4. Probable OSA:  Patient needs outpatient sleep study.  Donna Christen Jamison Neighbor, M.D. [facility] Pulmonary & Critical Care Pager:  [phone] After 3pm or if no response, call [phone] 06/19/2016, 4:42 PM

## 2016-06-19 NOTE — Progress Notes (Signed)
[facility] Progress Note  3 Days Post-Op  Subjective: Laying in bed in NAD -denies pain in area of gluteal abscess. Denies fever/chills. Reports that the dressing over his wound is not being changed daily. Having regular BMs using bedside commode. Denies any other complaints. Requests to shower.  VSS. WBC normal.  Objective: Vital signs in last 24 hours: Temp:  [97.7 F (36.5 C)-98.8 F (37.1 C)] 97.7 F (36.5 C) (07/20 0826) Pulse Rate:  [69-90] 89 (07/20 0745) Resp:  [20-25] 20 (07/20 0421) BP: (90-124)/(29-80) 124/80 mmHg (07/20 0746) SpO2:  [89 %-98 %] 98 % (07/20 0421) Weight:  [120.294 kg (265 lb 3.2 oz)] 120.294 kg (265 lb 3.2 oz) (07/20 0421) Last BM Date: 06/15/16  Intake/Output from previous day: 07/19 0701 - 07/20 0700 In: 720 [P.O.:720] Out: 700 [Urine:700] Intake/Output this shift: Total I/O In: -  Out: 500 [Urine:500]  PE: Gen:  Alert, NAD, pleasant and cooperative  Skin: gluteal abscess superior of anus on the right; 4-6 cm, open, granulation tissue with minimal slough, small area of tracking towards prostate/rectum, no purulence appreciated. Penrose drain tacked to skin but not actively draining - I removed the drain and placed a wet-to-dry dressing.  Lab Results:  No results for input(s): WBC, HGB, HCT, PLT in the last 72 hours. BMET  Recent Labs  06/18/16 0544  NA 140  K 3.6  CL 99*  CO2 32  GLUCOSE 93  BUN <5*  CREATININE 0.76  CALCIUM 9.1   PT/INR No results for input(s): LABPROT, INR in the last 72 hours. CMP     Component Value Date/Time   NA 140 06/18/2016 0544   K 3.6 06/18/2016 0544   CL 99* 06/18/2016 0544   CO2 32 06/18/2016 0544   GLUCOSE 93 06/18/2016 0544   BUN <5* 06/18/2016 0544   CREATININE 0.76 06/18/2016 0544   CALCIUM 9.1 06/18/2016 0544   PROT 4.9* 06/11/2016 0506   ALBUMIN 2.0* 06/12/2016 0234   AST 20 06/11/2016 0506   ALT 20 06/11/2016 0506   ALKPHOS 39 06/11/2016 0506   BILITOT 0.5 06/11/2016  0506   GFRNONAA >60 06/18/2016 0544   GFRAA >60 06/18/2016 0544   Lipase  No results found for: LIPASE  Studies/Results: No results found.  Anti-infectives: Anti-infectives    Start     Dose/Rate Route Frequency Ordered Stop   06/14/16 1700  cefUROXime (CEFTIN) tablet 500 mg     500 mg Oral 2 times daily with meals 06/14/16 1458     06/11/16 1000  vancomycin (VANCOCIN) IVPB 1000 mg/200 mL premix  Status:  Discontinued     1,000 mg 200 mL/hr over 60 Minutes Intravenous Every 12 hours 06/11/16 0915 06/13/16 1400   06/11/16 0700  vancomycin (VANCOCIN) IVPB 750 mg/150 ml premix  Status:  Discontinued     750 mg 150 mL/hr over 60 Minutes Intravenous Every 12 hours 06/10/16 1721 06/11/16 0915   06/10/16 2300  piperacillin-tazobactam (ZOSYN) IVPB 3.375 g  Status:  Discontinued     3.375 g 12.5 mL/hr over 240 Minutes Intravenous Every 8 hours 06/10/16 1721 06/14/16 1458   06/10/16 1745  vancomycin (VANCOCIN) IVPB 1000 mg/200 mL premix     1,000 mg 200 mL/hr over 60 Minutes Intravenous  Once 06/10/16 1632 06/10/16 1839   06/10/16 1630  piperacillin-tazobactam (ZOSYN) IVPB 3.375 g     3.375 g 100 mL/hr over 30 Minutes Intravenous  Once 06/10/16 1624 06/10/16 1814   06/10/16 1630  vancomycin (VANCOCIN) IVPB 1000  mg/200 mL premix     1,000 mg 200 mL/hr over 60 Minutes Intravenous  Once 06/10/16 1624 06/10/16 1945     Assessment/Plan Right gluteal abscess S/p I&D by Tseui on 06/10/16  - wound is clean and healing appropriately. - cultures show E.coli and strep viridans, previously treated with vancomycin (7/11-7/14) and again ceftin (7/15)  Recommendations: patient should continue once daily wet to dry dressing changes until he follow's up with our office next week. He does report he has someone at home who can help him with dressing changes. He may shower with soap and water and replace clean dressing after shower. Please be sure his gluteal fold is kept clean, as there was stool on exam  today.   We will sign off but will be available for questions as needed. Thank you!    LOS: 9 days    Adam Phenix , [facility] 06/19/2016, 10:45 AM Pager: [phone] Consults: [phone] Mon-Fri 7:00 am-4:30 pm Sat-Sun 7:00 am-11:30 am

## 2016-06-20 ENCOUNTER — Inpatient Hospital Stay (HOSPITAL_COMMUNITY): Payer: MEDICAID

## 2016-06-20 DIAGNOSIS — R0902 Hypoxemia: Secondary | ICD-10-CM

## 2016-06-20 DIAGNOSIS — R079 Chest pain, unspecified: Secondary | ICD-10-CM

## 2016-06-20 DIAGNOSIS — G4733 Obstructive sleep apnea (adult) (pediatric): Secondary | ICD-10-CM

## 2016-06-20 LAB — TROPONIN I

## 2016-06-20 LAB — CBC
HEMATOCRIT: 38.5 % — AB (ref 39.0–52.0)
HEMOGLOBIN: 12.6 g/dL — AB (ref 13.0–17.0)
MCH: 30.9 pg (ref 26.0–34.0)
MCHC: 32.7 g/dL (ref 30.0–36.0)
MCV: 94.4 fL (ref 78.0–100.0)
Platelets: 401 10*3/uL — ABNORMAL HIGH (ref 150–400)
RBC: 4.08 MIL/uL — ABNORMAL LOW (ref 4.22–5.81)
RDW: 14.7 % (ref 11.5–15.5)
WBC: 6.5 10*3/uL (ref 4.0–10.5)

## 2016-06-20 LAB — BASIC METABOLIC PANEL
Anion gap: 8 (ref 5–15)
CO2: 33 mmol/L — ABNORMAL HIGH (ref 22–32)
CREATININE: 0.86 mg/dL (ref 0.61–1.24)
Calcium: 9.2 mg/dL (ref 8.9–10.3)
Chloride: 96 mmol/L — ABNORMAL LOW (ref 101–111)
GFR calc Af Amer: >60 mL/min (ref >60)
GLUCOSE: 98 mg/dL (ref 65–99)
POTASSIUM: 3.3 mmol/L — AB (ref 3.5–5.1)
SODIUM: 137 mmol/L (ref 135–145)

## 2016-06-20 LAB — ECHOCARDIOGRAM COMPLETE
HEIGHTINCHES: 66 in
WEIGHTICAEL: 4179.92 [oz_av]

## 2016-06-20 LAB — HEPARIN LEVEL (UNFRACTIONATED)
HEPARIN UNFRACTIONATED: 0.25 [IU]/mL — AB (ref 0.30–0.70)
HEPARIN UNFRACTIONATED: 0.47 [IU]/mL (ref 0.30–0.70)

## 2016-06-20 MED ORDER — RIVAROXABAN 20 MG PO TABS: 20.0000 mg | ORAL_TABLET | Freq: Every day | ORAL | Status: DC

## 2016-06-20 MED ORDER — POTASSIUM CHLORIDE CRYS ER 20 MEQ PO TBCR
40.0000 meq | EXTENDED_RELEASE_TABLET | Freq: Once | ORAL | Status: AC
  Administered 2016-06-20: 40 meq via ORAL
  Filled 2016-06-20: qty 2

## 2016-06-20 MED ORDER — LISINOPRIL 5 MG PO TABS: 5.0000 mg | ORAL_TABLET | Freq: Every day | ORAL | Status: DC

## 2016-06-20 MED ORDER — RIVAROXABAN 15 MG PO TABS: 15.0000 mg | ORAL_TABLET | Freq: Two times a day (BID) | ORAL | Status: DC

## 2016-06-20 MED ORDER — FUROSEMIDE 40 MG PO TABS
40.0000 mg | ORAL_TABLET | Freq: Two times a day (BID) | ORAL | Status: DC
  Administered 2016-06-20: 40 mg via ORAL
  Filled 2016-06-20 (×2): qty 1

## 2016-06-20 MED ORDER — CEFUROXIME AXETIL 500 MG PO TABS: 500.0000 mg | ORAL_TABLET | Freq: Two times a day (BID) | ORAL | Status: DC

## 2016-06-20 MED ORDER — CARVEDILOL 6.25 MG PO TABS: 6.2500 mg | ORAL_TABLET | Freq: Two times a day (BID) | ORAL | Status: DC

## 2016-06-20 MED ORDER — HEPARIN BOLUS VIA INFUSION
2000.0000 [IU] | Freq: Once | INTRAVENOUS | Status: AC
  Administered 2016-06-20: 2000 [IU] via INTRAVENOUS
  Filled 2016-06-20: qty 2000

## 2016-06-20 MED ORDER — RIVAROXABAN 15 MG PO TABS
15.0000 mg | ORAL_TABLET | Freq: Two times a day (BID) | ORAL | Status: DC
  Administered 2016-06-20 – 2016-06-22 (×3): 15 mg via ORAL
  Filled 2016-06-20 (×3): qty 1

## 2016-06-20 MED ORDER — POTASSIUM CHLORIDE CRYS ER 20 MEQ PO TBCR
20.0000 meq | EXTENDED_RELEASE_TABLET | Freq: Two times a day (BID) | ORAL | Status: DC
  Administered 2016-06-20 – 2016-06-22 (×4): 20 meq via ORAL
  Filled 2016-06-20 (×4): qty 1

## 2016-06-20 MED FILL — CARVEDILOL 6.25 MG TABLET: 6.25 | 15 days supply | Qty: 30 | Fill #0

## 2016-06-20 MED FILL — **XARELTO 15 MG TABLET: 15 MG | 12 days supply | Qty: 24 | Fill #0

## 2016-06-20 MED FILL — CEFUROXIME AXETIL 500 MG TA: 500 | 14 days supply | Qty: 28 | Fill #0

## 2016-06-20 MED FILL — ?LISINOPRIL 5 MG TABLET: 5 | 30 days supply | Qty: 30 | Fill #0

## 2016-06-20 NOTE — Progress Notes (Signed)
Echocardiogram 2D Echocardiogram has been performed.  Justin Sampson 06/20/2016, 10:09 AM

## 2016-06-20 NOTE — Progress Notes (Signed)
ANTICOAGULATION CONSULT NOTE - Follow Up Consult  Pharmacy Consult for Heparin  Indication: pulmonary embolus  No Known Allergies  Patient Measurements: Height: 5\' 6"  (167.6 cm) Weight: 261 lb 3.9 oz (118.5 kg) IBW/kg (Calculated) : 63.8  Vital Signs: Temp: 98.1 F (36.7 C) (07/21 0835) Temp Source: Oral (07/21 0835) BP: 119/81 mmHg (07/21 0835) Pulse Rate: 65 (07/21 0835)  Labs:  Recent Labs  06/18/16 0544 06/19/16 1523 06/19/16 2044 06/20/16 0313 06/20/16 0314 06/20/16 1108  HGB  --   --   --  12.6*  --   --   HCT  --   --   --  38.5*  --   --   PLT  --   --   --  401*  --   --   HEPARINUNFRC  --   --  0.32  --  0.25* 0.47  CREATININE 0.76  --   --  0.86  --   --   TROPONINI  --  <0.03 <0.03 <0.03  --   --     Estimated Creatinine Clearance: 113.5 mL/min (by C-G formula based on Cr of 0.86).  Assessment: Heparin for new onset PE. Will be transitioning to xarelto for PE treatment at 1700 on 7/21 per Dr. Isidoro Donning. Heparin scheduled to stop at 1630 7/21. HL currently therapeutic at 0.41. Hgb 12.6, plt 401. No bleeding reported.   Goal of Therapy:  Heparin level 0.3-0.7 units/ml Monitor platelets by anticoagulation protocol: Yes   Plan:  -Stop heparin 7/21 at 1630 -Start xarelto 15mg  BID at 1700 -Likely discharge home today or tomorrow  Sherle Poe, PharmD Clinical Pharmacist  12:00 PM, 06/20/2016

## 2016-06-20 NOTE — Evaluation (Signed)
Physical Therapy Evaluation Patient Details Name: Justin Sampson MRN: [id number]  DOB: 1956-08-01 Today's Date: 06/20/2016   History of Present Illness  60 year old male with sepsis secondary to gluteal abscess status post I&D with incidentally found systolic congestive heart failure and persistent hypoxic respiratory failure despite diuresis. Patient incidentally found to have right-sided pulmonary embolism without evidence of right ventricular dysfunction  Clinical Impression  Patient demonstrates deficits in functional mobility as indicated below. Patient will need continued skilled PT to address deficits and maximize function. Will see as indicated and progress as tolerated.  OF NOTE: ambulated on room air with desaturation to 82%, improved to 86% almost immediately and back to 90% with seated rest. Reapplied 3 liters [place] with O2 saturations improving 96%.    Follow Up Recommendations No PT follow up; Supervision/assistance - 24 hour    Equipment Recommendations  Rolling walker with 5" wheels    Recommendations for Other Services       Precautions / Restrictions Precautions Precautions: Fall Restrictions Weight Bearing Restrictions: No      Mobility  Bed Mobility Overal bed mobility: Needs Assistance Bed Mobility: Supine to Sit     Supine to sit: Supervision     General bed mobility comments: Supervision for safety  Transfers Overall transfer level: Needs assistance Equipment used: Rolling walker (2 wheeled) Transfers: Sit to/from Stand Sit to Stand: Min guard         General transfer comment: Min guard assist for safety. VCs for hand placement and RW use. Pt tends to rest forearms on sink counter or RW when completing static standing tasks and needed cues to stand up straight  Ambulation/Gait Ambulation/Gait assistance: Supervision Ambulation Distance (Feet): 180 Feet Assistive device: Rolling walker (2 wheeled) Gait Pattern/deviations: WFL(Within Functional  Limits) Gait velocity: decreased   General Gait Details: slow gait speed, reliance on RW for stability. ambulated on room air with saturations dropping to 82% but improved quickly to 86% within 10 seconds then 90% at rest.   Stairs            Wheelchair Mobility    Modified Rankin (Stroke Patients Only)       Balance Overall balance assessment: Needs assistance Sitting-balance support: No upper extremity supported;Feet supported Sitting balance-Leahy Scale: Good     Standing balance support: Bilateral upper extremity supported;During functional activity Standing balance-Leahy Scale: Poor Standing balance comment: Reliant on UE support                             Pertinent Vitals/Pain Pain Assessment: No/denies pain    Home Living Family/patient expects to be discharged to:: Private residence Living Arrangements: Non-relatives/Friends Available Help at Discharge: Friend(s);Available 24 hours/day (unsure if this is accurate) Type of Home: House       Home Layout: One level Home Equipment: None Additional Comments: Information obtained over the phone from pt's friend who lives with him - friend did not speak [place] well    Prior Function Level of Independence: Independent               Hand Dominance   Dominant Hand: Right    Extremity/Trunk Assessment   Upper Extremity Assessment: Overall WFL for tasks assessed           Lower Extremity Assessment: Overall WFL for tasks assessed      Cervical / Trunk Assessment: Normal  Communication   Communication: No difficulties  Cognition Arousal/Alertness: Awake/alert Behavior During Therapy: WFL for  tasks assessed/performed Overall Cognitive Status: Within Functional Limits for tasks assessed                      General Comments      Exercises        Assessment/Plan    PT Assessment Patient needs continued PT services  PT Diagnosis Difficulty walking;Abnormality of  gait   PT Problem List Decreased activity tolerance;Decreased balance;Decreased mobility;Cardiopulmonary status limiting activity  PT Treatment Interventions DME instruction;Gait training;Stair training;Functional mobility training;Therapeutic activities;Therapeutic exercise;Balance training;Patient/family education   PT Goals (Current goals can be found in the Care Plan section) Acute Rehab PT Goals Patient Stated Goal: to get better PT Goal Formulation: With patient Time For Goal Achievement: 07/04/16 Potential to Achieve Goals: Good    Frequency Min 3X/week   Barriers to discharge        Co-evaluation               End of Session Equipment Utilized During Treatment: Gait belt Activity Tolerance: Patient tolerated treatment well Patient left: in chair;with call bell/phone within reach;with chair alarm set Nurse Communication: Mobility status         Time: 9629-5284 PT Time Calculation (min) (ACUTE ONLY): 26 min   Charges:   PT Evaluation $PT Eval Moderate Complexity: 1 Procedure     PT G CodesFabio Asa July 17, 2016, 1:45 PM Charlotte Crumb, PT DPT  [phone]

## 2016-06-20 NOTE — Hospital Discharge Follow-Up (Signed)
[facility] [facility]:   This Case Manager received call from Jacqlyn Krauss, RN CM indicating patient needing hospital follow-up appointment for right gluteal abscess, acute on chronic systolic heart failure, pulmonary embolism. Patient is uninsured and does not have a PCP. This Case Manager met with patient Designer, multimedia used for language translation. Interpreter [phone])  at bedside and discussed the medical management and resources (including onsite pharmacy, Financial Counseling) available at [facility] and [facility]. Patient able to obtain discharge medications at pharmacy. Patient appreciative of information and agreeable to appointment being scheduled. Appointment scheduled for 06/26/16 at 0930 with Dr. Jarold Song. AVS updated. Jacqlyn Krauss, RN CM also updated.

## 2016-06-20 NOTE — Progress Notes (Signed)
[facility]                                                                              Patient Demographics  Justin Sampson, is a 60 y.o. male, DOB - Oct 19, 1956, ZOX:[id number]  Admit date - 06/10/2016   Admitting Physician Delano Metz, MD  Outpatient Primary MD for the patient is No primary care provider on file.  Outpatient specialists:   LOS - 10  days    Chief Complaint  Patient presents with  . Wound Infection       Brief summary   60 y.o. HM SPANISH SPEAKING ONLY PMHx HTN and obesity who presented to ED with "two sores on his bottom", very painful and weren't healing. In ED pt was hypotensive. ED MD noted black stool on rectal exam and started a PPI drip. CT pelvis showed gluteal abscess tracking towards the prostate with soft-tissue gas. He was taken to the OR by Dr Corliss Skains with drainage/debridement of a large abscess.      Assessment & Plan    Right gluteal abscess  -7/11 S/P I&D and wound care per Gen Surgery -zosyn discontinued on 7/15 and following cultures results patient started on ceftin. wound cultures showed Escherichia coli, strep viridans  -vancomycin discontinued on 7/14 -continue supportive care and increase activity.   - Blood cultures negative, sepsis ruled out.  - Reevaluated by surgery, recommended continue dressing changes BID, will need home health  Acute on Chronic systolic heart failure: but new diagnosis for him  -per Echo EF 35-40%, Cardiac cath with normal coronaries  - Cardiology following, continue IV Lasix for diuresis, negative balance of 3.2 L, cardiology to transition patient to oral Lasix - Heart healthy, low-sodium diet, Fluid restriction   Right pulmonary embolism- acute -CT angiogram of the chest showed significant pulmonary emboli involving the distal right main pulmonary artery extending into the branches in the right upper lobe, right lower lobe, wedge shaped parenchymal opacity in the posterior medial right  lower lobe suspicious for infarction. - Patient was started on IV heparin, discussed in detail with the patient through the translator regarding Coumadin/Lovenox versus NOAC's for anti-coagulation. He prefers NOAC's , started on xarelto. Case [facility] Outpatient appointments arranged. - Repeat 2-D echo today - No thrombolysis per IR, pulmonology   Hx of hypertension: presented with Hypotension  -BP stable, on beta blocker, lisinopril , Lasix  Melena?- Normocytic anemia  -black stool according to ED MD, no clear evidence of active bleeding appreciated during hospitalization. "drop" likely due to aggressive volume expansion and blood loss during surgery; instead of GIB  -will continue pepcid, H&H has remained stable  Renal insuff -Resolved With IVF's -renal function WNL now  NSVT -Resolved, keep magnesium around 2, potassium around 4 -Echocardiogram demonstrating EF 35-40% and some wall motion abnormalities   Hypomagnesemia, Hypomagnesemia replaced   Morbid Obesity -  -Body mass index is 46.21 kg/(m^2). -low calorie diet and exercise discussed with patient   GERD -will continue famotidine  Presumed OSA -will need outpatient sleep study and most likely CPAP at bedtime  Acute resp failure with hypoxia:  Improving, likely due to mild CHF exacerbation and received IV fluids   Code Status: Full CODE STATUS DVT Prophylaxis:   SCD's Family Communication: Discussed in detail with the patient, all imaging results, lab results explained to the patient  Disposition Plan:  When cleared by cardiology   Time Spent in minutes  25 minutes  Procedures:  7/11 I&D large R gluteal abscess in OR   2-D Echo: - Left ventricle: The cavity size was normal. Systolic function was  moderately reduced. The estimated ejection fraction was in the  range of 35% to 40%. Hypokinesis of the lateral and inferior  myocardium. - Left  atrium: The atrium was moderately dilated.  Left Heart cath: 7/17  Consultants:   Cardiology   Antimicrobials:   Zosyn 7/11 >7/14 Vanc 7/11 >7/15 ceftin 7/15   Medications  Scheduled Meds: . antiseptic oral rinse  7 mL Mouth Rinse BID  . carvedilol  6.25 mg Oral BID WC  . cefUROXime  500 mg Oral BID WC  . famotidine  20 mg Oral Daily  . furosemide  40 mg Oral BID  . lisinopril  5 mg Oral Daily  . potassium chloride  20 mEq Oral BID  . rivaroxaban  15 mg Oral BID WC   Followed by  . [START ON 07/12/2016] rivaroxaban  20 mg Oral Q breakfast  . sodium chloride flush  3 mL Intravenous Q12H  . sodium chloride flush  3 mL Intravenous Q12H   Continuous Infusions: . heparin 1,850 Units/hr (06/20/16 0349)   PRN Meds:.sodium chloride, sodium chloride, [DISCONTINUED] acetaminophen **OR** acetaminophen, acetaminophen, ondansetron (ZOFRAN) IV, ondansetron **OR** [DISCONTINUED] ondansetron (ZOFRAN) IV, oxyCODONE, sodium chloride flush, sodium chloride flush   Antibiotics   Anti-infectives    Start     Dose/Rate Route Frequency Ordered Stop   06/20/16 0000  cefUROXime (CEFTIN) 500 MG tablet     500 mg Oral 2 times daily with meals 06/20/16 1045     06/14/16 1700  cefUROXime (CEFTIN) tablet 500 mg     500 mg Oral 2 times daily with meals 06/14/16 1458     06/11/16 1000  vancomycin (VANCOCIN) IVPB 1000 mg/200 mL premix  Status:  Discontinued     1,000 mg 200 mL/hr over 60 Minutes Intravenous Every 12 hours 06/11/16 0915 06/13/16 1400   06/11/16 0700  vancomycin (VANCOCIN) IVPB 750 mg/150 ml premix  Status:  Discontinued     750 mg 150 mL/hr over 60 Minutes Intravenous Every 12 hours 06/10/16 1721 06/11/16 0915   06/10/16 2300  piperacillin-tazobactam (ZOSYN) IVPB 3.375 g  Status:  Discontinued     3.375 g 12.5 mL/hr over 240 Minutes Intravenous Every 8 hours 06/10/16 1721 06/14/16 1458   06/10/16 1745  vancomycin (VANCOCIN) IVPB 1000 mg/200 mL premix     1,000 mg 200 mL/hr over  60 Minutes Intravenous  Once 06/10/16 1632 06/10/16 1839   06/10/16 1630  piperacillin-tazobactam (ZOSYN) IVPB 3.375 g     3.375 g 100 mL/hr over 30 Minutes Intravenous  Once 06/10/16 1624 06/10/16 1814   06/10/16 1630  vancomycin (VANCOCIN) IVPB 1000 mg/200 mL premix     1,000 mg 200 mL/hr over 60 Minutes Intravenous  Once 06/10/16 1624 06/10/16 1945        Subjective:   Rockney Grenz was seen and examined today. No complaints per patient,  night desats due to sleep apnea. Discussed in detail regarding pulmonary embolism with the help of translator. Denies any chest pain.  No worsening of shortness  of breath today. Patient denies dizziness, abdominal pain, N/V/D/C, new weakness, numbess, tingling. No acute events overnight.    Objective:   Filed Vitals:   06/20/16 0604 06/20/16 0734 06/20/16 0735 06/20/16 0835  BP:  119/70  119/81  Pulse:  45 73 65  Temp:    98.1 F (36.7 C)  TempSrc:    Oral  Resp:      Height:      Weight: 118.5 kg (261 lb 3.9 oz)     SpO2:  99%  93%    Intake/Output Summary (Last 24 hours) at 06/20/16 1226 Last data filed at 06/20/16 0842  Gross per 24 hour  Intake 206.89 ml  Output   1200 ml  Net -993.11 ml     Wt Readings from Last 3 Encounters:  06/20/16 118.5 kg (261 lb 3.9 oz)     Exam  General: Alert and oriented x 3, NAD  HEENT:   Neck: Supple, no JVD, no masses  Cardiovascular: S1 S2 auscultated, no rubs, murmurs or gallops. RRR  Respiratory: Clear to auscultation bilaterally, no wheezing, rales or rhonchi  Gastrointestinal: Soft, nontender, nondistended, + bowel sounds  Ext: no cyanosis clubbing, 1+ edema  Neuro: no new deficits  Skin:  Dressing intact  psych: Normal affect and demeanor, alert and oriented x3    Data Reviewed:  I have personally reviewed following labs and imaging studies  Micro Results Recent Results (from the past 240 hour(s))  Aerobic Culture (superficial specimen)     Status: None   Collection  Time: 06/10/16  4:26 PM  Result Value Ref Range Status   Specimen Description WOUND  Final   Special Requests RECTUM  Final   Gram Stain   Final    ABUNDANT WBC PRESENT, PREDOMINANTLY PMN ABUNDANT GRAM VARIABLE ROD MODERATE GRAM POSITIVE COCCI IN PAIRS    Culture   Final    RARE ESCHERICHIA COLI SUSCEPTIBILITIES PERFORMED ON PREVIOUS CULTURE WITHIN THE LAST 5 DAYS. FEW VIRIDANS STREPTOCOCCUS    Report Status 06/14/2016 FINAL  Final  Blood Culture (routine x 2)     Status: None   Collection Time: 06/10/16  4:39 PM  Result Value Ref Range Status   Specimen Description BLOOD RIGHT HAND  Final   Special Requests BOTTLES DRAWN AEROBIC AND ANAEROBIC 5CC  Final   Culture NO GROWTH 5 DAYS  Final   Report Status 06/15/2016 FINAL  Final  Blood Culture (routine x 2)     Status: None   Collection Time: 06/10/16  4:56 PM  Result Value Ref Range Status   Specimen Description BLOOD RIGHT HAND  Final   Special Requests BOTTLES DRAWN AEROBIC ONLY 5CC  Final   Culture NO GROWTH 5 DAYS  Final   Report Status 06/15/2016 FINAL  Final  Urine culture     Status: None   Collection Time: 06/10/16  6:50 PM  Result Value Ref Range Status   Specimen Description URINE, CLEAN CATCH  Final   Special Requests NONE  Final   Culture NO GROWTH  Final   Report Status 06/12/2016 FINAL  Final  Aerobic/Anaerobic Culture (surgical/deep wound)     Status: None   Collection Time: 06/10/16  9:18 PM  Result Value Ref Range Status   Specimen Description ABSCESS  Final   Special Requests RT GLUTEAL PT ON VANC  Final   Gram Stain   Final    RARE WBC PRESENT, PREDOMINANTLY PMN FEW GRAM POSITIVE COCCI IN PAIRS RARE GRAM VARIABLE ROD FEW  SQUAMOUS EPITHELIAL CELLS PRESENT    Culture   Final    MODERATE VIRIDANS STREPTOCOCCUS FEW ESCHERICHIA COLI MIXED ANAEROBIC FLORA PRESENT.  CALL LAB IF FURTHER IID REQUIRED.    Report Status 06/14/2016 FINAL  Final   Organism ID, Bacteria ESCHERICHIA COLI  Final       Susceptibility   Escherichia coli - MIC*    AMPICILLIN >=32 RESISTANT Resistant     CEFAZOLIN <=4 SENSITIVE Sensitive     CEFEPIME <=1 SENSITIVE Sensitive     CEFTAZIDIME <=1 SENSITIVE Sensitive     CEFTRIAXONE <=1 SENSITIVE Sensitive     CIPROFLOXACIN <=0.25 SENSITIVE Sensitive     GENTAMICIN <=1 SENSITIVE Sensitive     IMIPENEM <=0.25 SENSITIVE Sensitive     TRIMETH/SULFA <=20 SENSITIVE Sensitive     AMPICILLIN/SULBACTAM 16 INTERMEDIATE Intermediate     PIP/TAZO <=4 SENSITIVE Sensitive     * FEW ESCHERICHIA COLI  MRSA PCR Screening     Status: None   Collection Time: 06/10/16 11:51 PM  Result Value Ref Range Status   MRSA by PCR NEGATIVE NEGATIVE Final    Comment:        The GeneXpert MRSA Assay (FDA approved for NASAL specimens only), is one component of a comprehensive MRSA colonization surveillance program. It is not intended to diagnose MRSA infection nor to guide or monitor treatment for MRSA infections.     Radiology Reports Ct Angio Chest Pe W Or Wo Contrast  06/19/2016  ADDENDUM REPORT: 06/19/2016 14:57 ADDENDUM: I discussed the findings of this CTA chest with Dr. Isidoro Donning at 2:48 p.m. Also I had Dr. Archer Asa review the case and Interventional Radiology would not be helpful since there is no evidence of right heart strain present. Electronically Signed   By: Dwyane Dee M.D.   On: 06/19/2016 14:57  06/19/2016  CLINICAL DATA:  Mid chest tightness, some shortness of breath EXAM: CT ANGIOGRAPHY CHEST WITH CONTRAST TECHNIQUE: Multidetector CT imaging of the chest was performed using the standard protocol during bolus administration of intravenous contrast. Multiplanar CT image reconstructions and MIPs were obtained to evaluate the vascular anatomy. CONTRAST:  100 cc Isovue 370 COMPARISON:  Portable chest x-ray of 06/10/2016 FINDINGS: There is a large pulmonary embolus which extends from the distal main right pulmonary artery origin into the right upper lobe and right lower lobe  branches partially occluding branches to both the right upper lobe and right lower lobe segments. No definite pulmonary emboli are seen involving the left pulmonary artery and its branches. The RV over LV ratio is within normal limits, with no evidence of right heart strain. The thoracic aorta is not well opacified but no acute abnormality is seen. The mid ascending thoracic aorta measures 32 mm in diameter. There is a large amount of epicardial fat present. No mediastinal or hilar adenopathy is seen. On images through the upper abdomen there may be a tiny faintly calcified gallstone layering dependently within the gallbladder. The thyroid gland is unremarkable. On lung window images, there is bibasilar atelectasis present. Within the posterior medial right lower lobe there is peripheral parenchymal opacity suspicious for a focus of pulmonary infarction in view of the degree of largely occlusive pulmonary embolism to that segment. Some mosaic profusion also is present as a result of the vascular abnormality. No lung nodule or mass is seen. The central airway is patent. There are degenerative changes throughout the mid to lower thoracic spine. Review of the MIP images confirms the above findings. IMPRESSION: 1. Significant pulmonary  emboli involving the distal right main pulmonary artery extending into branches to the right upper lobe and right lower lobe with partial occlusion to both the right upper lobe and lower lobe branches. 2. Peripheral wedge-shaped parenchymal opacity in the posterior medial right lower lobe suspicious for a focus of infarction in view of the degree of partially occlusive pulmonary emboli to that segment. Electronically Signed: By: Dwyane Dee M.D. On: 06/19/2016 14:45   Ct Abdomen Pelvis W Contrast  06/10/2016  CLINICAL DATA:  Perirectal pain with focal wound EXAM: CT ABDOMEN AND PELVIS WITH CONTRAST TECHNIQUE: Multidetector CT imaging of the abdomen and pelvis was performed using the  standard protocol following bolus administration of intravenous contrast. CONTRAST:  100 mL ISOVUE-300 IOPAMIDOL (ISOVUE-300) INJECTION 61% COMPARISON:  None. FINDINGS: Lower chest:  No acute findings. Hepatobiliary: The liver is mildly fatty infiltrated. The gallbladder is within normal limits. Pancreas: No mass, inflammatory changes, or other significant abnormality. Spleen: Within normal limits in size and appearance. Adrenals/Urinary Tract: No masses identified. No evidence of hydronephrosis. Small right renal cyst is seen. The bladder is well distended. Stomach/Bowel: No evidence of obstruction, inflammatory process, or abnormal fluid collections. The appendix is within normal limits. Mild diverticular change is noted without evidence of diverticulitis. Vascular/Lymphatic: No pathologically enlarged lymph nodes. No evidence of abdominal aortic aneurysm. Reproductive: No mass or other significant abnormality. Other: Fat containing inguinal hernia is seen. In the right gluteal region medially of there is some inflammatory change and a small amount of subcutaneous air likely representing inflammation. Additionally more superiorly adjacent to the prostate and rectum there are areas of air and fluid identified. The largest of these lies just lateral to the prostate and measures 3.4 x 2.4 x 14.7 cm in greatest AP, transverse and craniocaudad projections respectively. This is best visualized on image number 99 of series 2 as well as images 111 of series 5 and 97 of series 6. These are consistent with small abscesses Musculoskeletal:  No suspicious bone lesions identified. IMPRESSION: Air-fluid collections adjacent to the prostate laterally on the right consistent with abscesses. Smaller collection is noted somewhat more posteriorly adjacent to the distal rectum. Additionally some inflammatory changes in the right gluteal region medially are seen. Electronically Signed   By: Alcide Clever M.D.   On: 06/10/2016 18:21    Dg Chest Port 1 View  06/10/2016  CLINICAL DATA:  Possible sepsis EXAM: PORTABLE CHEST 1 VIEW COMPARISON:  None. FINDINGS: The heart size is enlarged. There is no focal infiltrate, pulmonary edema, or pleural effusion. The lung volumes are low. Mediastinal contour is unremarkable given the AP semi-erect technique. The visualized skeletal structures are unremarkable. IMPRESSION: Cardiomegaly.  No focal pneumonia. Electronically Signed   By: Sherian Rein M.D.   On: 06/10/2016 16:47    Lab Data:  CBC:  Recent Labs Lab 06/14/16 0549 06/16/16 0241 06/20/16 0313  WBC 6.4 6.0 6.5  HGB 11.5* 11.5* 12.6*  HCT 34.6* 34.9* 38.5*  MCV 93.0 93.3 94.4  PLT 331 399 401*   Basic Metabolic Panel:  Recent Labs Lab 06/14/16 0549 06/15/16 0308 06/16/16 0241 06/18/16 0544 06/20/16 0313  NA 137  --  136 140 137  K 3.2*  --  3.6 3.6 3.3*  CL 100*  --  101 99* 96*  CO2 29  --  28 32 33*  GLUCOSE 101*  --  92 93 98  BUN <5*  --  <5* <5* <5*  CREATININE 0.87  --  0.71 0.76 0.86  CALCIUM 8.5*  --  8.8* 9.1 9.2  MG 1.7 1.7  --  1.8  --    GFR: Estimated Creatinine Clearance: 113.5 mL/min (by C-G formula based on Cr of 0.86). Liver Function Tests: No results for input(s): AST, ALT, ALKPHOS, BILITOT, PROT, ALBUMIN in the last 168 hours. No results for input(s): LIPASE, AMYLASE in the last 168 hours. No results for input(s): AMMONIA in the last 168 hours. Coagulation Profile:  Recent Labs Lab 06/16/16 0928  INR 1.70*   Cardiac Enzymes:  Recent Labs Lab 06/19/16 1523 06/19/16 2044 06/20/16 0313  TROPONINI <0.03 <0.03 <0.03   BNP (last 3 results) No results for input(s): PROBNP in the last 8760 hours. HbA1C: No results for input(s): HGBA1C in the last 72 hours. CBG: No results for input(s): GLUCAP in the last 168 hours. Lipid Profile: No results for input(s): CHOL, HDL, LDLCALC, TRIG, CHOLHDL, LDLDIRECT in the last 72 hours. Thyroid Function Tests: No results for input(s):  TSH, T4TOTAL, FREET4, T3FREE, THYROIDAB in the last 72 hours. Anemia Panel: No results for input(s): VITAMINB12, FOLATE, FERRITIN, TIBC, IRON, RETICCTPCT in the last 72 hours. Urine analysis:    Component Value Date/Time   COLORURINE YELLOW 06/10/2016 1850   APPEARANCEUR CLEAR 06/10/2016 1850   LABSPEC 1.029 06/10/2016 1850   PHURINE 6.0 06/10/2016 1850   GLUCOSEU NEGATIVE 06/10/2016 1850   HGBUR NEGATIVE 06/10/2016 1850   BILIRUBINUR NEGATIVE 06/10/2016 1850   KETONESUR NEGATIVE 06/10/2016 1850   PROTEINUR NEGATIVE 06/10/2016 1850   NITRITE NEGATIVE 06/10/2016 1850   LEUKOCYTESUR NEGATIVE 06/10/2016 1850     RAI,RIPUDEEP M.D. [facility] 06/20/2016, 12:26 PM  Pager: [phone] Between 7am to 7pm - call Pager - [phone]  After 7pm go to [email or web address] - password [email or web address]  Call night coverage person covering after 7pm

## 2016-06-20 NOTE — Consult Note (Addendum)
Name: Justin Sampson MRN: [id number] DOB: July 12, 1956    ADMISSION DATE:  06/10/2016 CONSULTATION DATE:  06/19/2016  REFERRING MD :  Thad Ranger, M.D. / [facility]  CHIEF COMPLAINT:  Acute Pulmonary Embolism  SIGNIFICANT EVENTS  7/11 - Admit w/ I&D of gluteal abscess 7/17 - LHC (no significant CAD & EF 25-35% by visual estimate w/ global hypokinesis but no aortic valve stenosis) 7/20 - Consult w/ PE  STUDIES:  TTE 7/13: LVEF 35-40% with hypokinesis of the lateral & inferior myocardium. Moderately dilated left atrium. Aortic valve not well visualized but no apparent stenosis or regurgitation. No mitral stenosis or regurgitation. RV normal in size and function. Pulmonary artery normal in size. IVC dilated. CTA CHEST 7/20: Large pulmonary embolism extending from distal right main pulmonary artery into right upper lobe and lower lobe branches with partial occlusion. No definite left pulmonary emboli noted. RV/LV ratio within normal limits without evidence of right heart strain. Bibasilar atelectasis. Medial peripheral right lower lobe opacity suspicious for pulmonary infarction. Some mosaic perfusion as well. No nodule or mass appreciated. No pathologic mediastinal adenopathy.  HISTORY OF PRESENT ILLNESS:  History obtained through the use of a phone translating service. Patient with past medical history for hypertension was admitted on 7/11 with painful and nonhealing ulcers on his bottom. Patient was hypotensive initially in the emergency department and was found to have "black" stool on rectal exam. Patient was subsequently found to have a gluteal abscess and was taken to the operating room by general surgery for drainage and debridement. Antibiotics were transitioned from Zosyn to Ceftin with culture data and vancomycin was ultimately discontinued. Patient had persistent hypoxia despite diuresis for newly found systolic congestive heart failure. Ultimately patient underwent CT angiogram of the chest which  revealed a right-sided pulmonary embolism but no evidence of right heart strain. Patient reports he has no history of clots. No clear family history of clots either. Has had questionable dyspnea recently. Has noted bilateral lower extremity edema right greater than left as well. Reports some mild leg discomfort. Denies any chest pain, pressure, or tightness. Denies any syncope or near syncope. Questionable subjective fever but no chills or sweats. No appreciated lymphadenopathy. No weight loss. Denies any rashes or abnormal bruising. Patient reports he has been more immobile than usual lately at home. REVIEW OF SYSTEMS:   SUBJECTIVE: As above.  VITAL SIGNS: Temp:  [98.1 F (36.7 C)-98.5 F (36.9 C)] 98.1 F (36.7 C) (07/21 0835) Pulse Rate:  [45-90] 65 (07/21 0835) Resp:  [20-22] 20 (07/21 0400) BP: (102-119)/(62-81) 119/81 mmHg (07/21 0835) SpO2:  [93 %-100 %] 93 % (07/21 0835) Weight:  [261 lb 3.9 oz (118.5 kg)] 261 lb 3.9 oz (118.5 kg) (07/21 0604)  PHYSICAL EXAMINATION: General:  Awake. Alert. No acute distress.   Integument:  Warm & dry. No rash on exposed skin. No bruising. Lymphatics:  No appreciated cervical or supraclavicular lymphadenoapthy. HEENT:  Moist mucus membranes. No oral ulcers. No scleral injection or icterus.  Cardiovascular:  Regular rate. Trace edema . No appreciable JVD given body habitus.  Pulmonary:  Diminished breath sounds bilateral lung bases. Symmetric chest wall expansion. No accessory muscle use on nasal cannula oxygen. Abdomen: Soft. Normal bowel sounds. Protuberant. Grossly nontender. Musculoskeletal:  Normal bulk and tone. Hand grip strength 5/5 bilaterally. No joint deformity or effusion appreciated. Neurological:   Moving all 4 extremities equally, no focal weakness Psychiatric:  Mood and affect congruent. Speech normal rhythm, rate & tone.    Recent Labs Lab  06/16/16 0241 06/18/16 0544 06/20/16 0313  NA 136 140 137  K 3.6 3.6 3.3*  CL 101 99*  96*  CO2 28 32 33*  BUN <5* <5* <5*  CREATININE 0.71 0.76 0.86  GLUCOSE 92 93 98    Recent Labs Lab 06/14/16 0549 06/16/16 0241 06/20/16 0313  HGB 11.5* 11.5* 12.6*  HCT 34.6* 34.9* 38.5*  WBC 6.4 6.0 6.5  PLT 331 399 401*   Ct Angio Chest Pe W Or Wo Contrast  06/19/2016  ADDENDUM REPORT: 06/19/2016 14:57 ADDENDUM: I discussed the findings of this CTA chest with Dr. Isidoro Donning at 2:48 p.m. Also I had Dr. Archer Asa review the case and Interventional Radiology would not be helpful since there is no evidence of right heart strain present. Electronically Signed   By: Dwyane Dee M.D.   On: 06/19/2016 14:57  06/19/2016  CLINICAL DATA:  Mid chest tightness, some shortness of breath EXAM: CT ANGIOGRAPHY CHEST WITH CONTRAST TECHNIQUE: Multidetector CT imaging of the chest was performed using the standard protocol during bolus administration of intravenous contrast. Multiplanar CT image reconstructions and MIPs were obtained to evaluate the vascular anatomy. CONTRAST:  100 cc Isovue 370 COMPARISON:  Portable chest x-ray of 06/10/2016 FINDINGS: There is a large pulmonary embolus which extends from the distal main right pulmonary artery origin into the right upper lobe and right lower lobe branches partially occluding branches to both the right upper lobe and right lower lobe segments. No definite pulmonary emboli are seen involving the left pulmonary artery and its branches. The RV over LV ratio is within normal limits, with no evidence of right heart strain. The thoracic aorta is not well opacified but no acute abnormality is seen. The mid ascending thoracic aorta measures 32 mm in diameter. There is a large amount of epicardial fat present. No mediastinal or hilar adenopathy is seen. On images through the upper abdomen there may be a tiny faintly calcified gallstone layering dependently within the gallbladder. The thyroid gland is unremarkable. On lung window images, there is bibasilar atelectasis present.  Within the posterior medial right lower lobe there is peripheral parenchymal opacity suspicious for a focus of pulmonary infarction in view of the degree of largely occlusive pulmonary embolism to that segment. Some mosaic profusion also is present as a result of the vascular abnormality. No lung nodule or mass is seen. The central airway is patent. There are degenerative changes throughout the mid to lower thoracic spine. Review of the MIP images confirms the above findings. IMPRESSION: 1. Significant pulmonary emboli involving the distal right main pulmonary artery extending into branches to the right upper lobe and right lower lobe with partial occlusion to both the right upper lobe and lower lobe branches. 2. Peripheral wedge-shaped parenchymal opacity in the posterior medial right lower lobe suspicious for a focus of infarction in view of the degree of partially occlusive pulmonary emboli to that segment. Electronically Signed: By: Dwyane Dee M.D. On: 06/19/2016 14:45    Discussion:   60 year old male with sepsis secondary to gluteal abscess status post I&D with incidentally found systolic congestive heart failure and persistent hypoxic respiratory failure despite diuresis. Patient incidentally found to have right-sided pulmonary embolism without evidence of right ventricular dysfunction. Suspect patient's pulmonary embolism is secondary to his recent immobility. No evidence of hemodynamic instability to suggest cor pulmonale or indicating need for lytic therapy. Patient does have hypoxia which is currently being treated but at present is not tachycardic.  Right Pulmonary Embolism: A: - No  c/o chest discomfort, headache or syncope - No obvious bleeding on  systemic anticoagulation - HGB and Platelets stable  P:  - continue systemic anticoagulation with heparin infusion per pharmacy. -  Troponin <0.03 x 3, BNP: 30.6 -  repeat transthoracic echocardiogram being done at present ( 7/21) but not yet  read. -  Currently no indication for IV or catheter directed lytic therapy.  - Continue to monitor H&H / for any signs of bleeding - Transition to Lesia Hausen per pharmacy/ primary service  for discharge home - Monitor renal function on Xaralto   Acute Hypoxic Respiratory Failure Secondary to Acute PE A: Remains hypoxic requiring oxygen  In NAD Saturation 93% on 3L  P: - Oxygen per [place] to maintain saturations greater than 93% - Wean oxygen as able to maintain saturations  Sepsis secondary to gluteal abscess A: - Afebrile - WBC 6.5 P: - Management per primary service.  Probable OSA:  A: Obese P: -Set up for out patient sleep study upon discharge   Bevelyn Ngo, AGACNP-BC [facility] Pager: [phone] 06/20/2016, 9:37 AM  Attending Note:  60 year old male presenting with a gluteal abscess who was found to have a PE.  On exam, lungs are clear.  I reviewed chest CT myself, right sided PE noted.  Discussed with PCCM-NP.  PE:  - Continue anti-coagulation.  - Transition to coumadin or NOAC prior to discharge.  Hypoxemia:  - Titrate O2 for sat of 88-92%.  - May transiently need O2 at home upon discharge, may need an ambulatory desaturation study prior to discharge to determine home O2 needs.  Probable OSA:  - Arrange for f/u with sleep clinic upon discharge.  PCCM will sign off, please call back if needed.  Patient seen and examined, agree with above note.  I dictated the care and orders written for this patient under my direction.  Alyson Reedy, MD [phone]

## 2016-06-20 NOTE — Progress Notes (Signed)
Occupational Therapy Evaluation Patient Details Name: Justin Sampson MRN: [id number] DOB: 01/22/1956 Today's Date: 06/20/2016    History of Present Illness 60 y.o. male admitted for 2 sores on his bottom that are very painful and will not heal - pt also hypotensive and shaking in ED. PM significant for HTN, mardiomyopathy, hypokalemia, and systolic CHF, and GERD.   Clinical Impression   PTA, pt was independent with ADLs and mobility. Pt currently requires min assist for LB ADLs and min guard assist for basic transfers due to balance deficits. SpO2 on 3L O2 94% sitting; decreased to 92% on RA still sitting; and dropped to 82% on RA while ambulating in hallway, but rebounded to 91% quicklyPt plans to d/c home with unknown level of assistance from his best friend/roommate. Pt will benefit from continued acute OT to increase independence and safety with ADLs and mobility to allow for safe discharge home. Recommend 3in1 for home use.    Follow Up Recommendations  No OT follow up;Supervision/Assistance - 24 hour    Equipment Recommendations  3 in 1 bedside comode    Recommendations for Other Services       Precautions / Restrictions Precautions Precautions: Fall Restrictions Weight Bearing Restrictions: No      Mobility Bed Mobility Overal bed mobility: Needs Assistance Bed Mobility: Supine to Sit     Supine to sit: Supervision     General bed mobility comments: Supervision for safety  Transfers Overall transfer level: Needs assistance Equipment used: Rolling walker (2 wheeled) Transfers: Sit to/from Stand Sit to Stand: Min guard         General transfer comment: Min guard assist for safety. VCs for hand placement and RW use. Pt tends to rest forearms on sink counter or RW when completing static standing tasks and needed cues to stand up straight    Balance Overall balance assessment: Needs assistance Sitting-balance support: No upper extremity supported;Feet  supported Sitting balance-Leahy Scale: Good     Standing balance support: Bilateral upper extremity supported;During functional activity Standing balance-Leahy Scale: Poor Standing balance comment: Reliant on UE support                            ADL Overall ADL's : Needs assistance/impaired     Grooming: Min guard;Standing   Upper Body Bathing: Set up;Sitting   Lower Body Bathing: Minimal assistance;Sit to/from stand   Upper Body Dressing : Set up;Sitting   Lower Body Dressing: Minimal assistance;Sit to/from stand   Toilet Transfer: Min guard;Ambulation;RW   Toileting- Architect and Hygiene: Min guard;Sit to/from stand       Functional mobility during ADLs: Min guard;Rolling walker General ADL Comments: SpO2 on 3L O2 94% sitting; decreased to 92% on RA still sitting; and dropped to 82% on RA while ambulating in hallway, but rebounded to 91% quickly     Vision Vision Assessment?: No apparent visual deficits   Perception     Praxis      Pertinent Vitals/Pain Pain Assessment: No/denies pain     Hand Dominance     Extremity/Trunk Assessment Upper Extremity Assessment Upper Extremity Assessment: Overall WFL for tasks assessed   Lower Extremity Assessment Lower Extremity Assessment: Overall WFL for tasks assessed   Cervical / Trunk Assessment Cervical / Trunk Assessment: Normal   Communication Communication Communication: No difficulties   Cognition Arousal/Alertness: Awake/alert Behavior During Therapy: WFL for tasks assessed/performed Overall Cognitive Status: Within Functional Limits for tasks assessed  General Comments       Exercises       Shoulder Instructions      Home Living Family/patient expects to be discharged to:: Private residence Living Arrangements: Non-relatives/Friends Available Help at Discharge: Friend(s);Available 24 hours/day (unsure if this is accurate) Type of Home: House        Home Layout: One level     Bathroom Shower/Tub: Chief Strategy Officer: Standard     Home Equipment: None   Additional Comments: Information obtained over the phone from pt's friend who lives with him - friend did not speak English well      Prior Functioning/Environment Level of Independence: Independent             OT Diagnosis: Acute pain   OT Problem List: Decreased activity tolerance;Impaired balance (sitting and/or standing);Decreased safety awareness;Decreased knowledge of use of DME or AE;Cardiopulmonary status limiting activity;Obesity;Pain   OT Treatment/Interventions: Self-care/ADL training;Therapeutic exercise;DME and/or AE instruction;Therapeutic activities;Energy conservation;Patient/family education;Balance training    OT Goals(Current goals can be found in the care plan section) Acute Rehab OT Goals Patient Stated Goal: to get better OT Goal Formulation: With patient Time For Goal Achievement: 07/04/16 Potential to Achieve Goals: Good ADL Goals Pt Will Perform Grooming: with modified independence;standing Pt Will Perform Upper Body Bathing: with modified independence;standing Pt Will Perform Lower Body Bathing: with modified independence;sit to/from stand Pt Will Transfer to Toilet: with modified independence;ambulating;bedside commode Pt Will Perform Toileting - Clothing Manipulation and hygiene: with modified independence;sit to/from stand Pt Will Perform Tub/Shower Transfer: Tub transfer;with supervision;ambulating;3 in 1;rolling walker  OT Frequency: Min 2X/week   Barriers to D/C: Decreased caregiver support  Unsure of amount of assistance available at home       Co-evaluation              End of Session Equipment Utilized During Treatment: Gait belt;Rolling walker Nurse Communication: Mobility status  Activity Tolerance: Patient tolerated treatment well Patient left: in chair;with call bell/phone within reach;with chair  alarm set   Time: 1103-1594 OT Time Calculation (min): 25 min Charges:  OT General Charges $OT Visit: 1 Procedure OT Evaluation $OT Eval Moderate Complexity: 1 Procedure G-Codes:    Nils Pyle, OTR/L Pager: [phone] 06/20/2016, 1:36 PM

## 2016-06-20 NOTE — Progress Notes (Signed)
Subjective: No CP  Breathign fair  Poor historian   Objective: Filed Vitals:   06/20/16 0604 06/20/16 0734 06/20/16 0735 06/20/16 0835  BP:  119/70  119/81  Pulse:  45 73 65  Temp:    98.1 F (36.7 C)  TempSrc:    Oral  Resp:      Height:      Weight: 261 lb 3.9 oz (118.5 kg)     SpO2:  99%  93%   Weight change: -3 lb 15.3 oz (-1.794 kg)  Intake/Output Summary (Last 24 hours) at 06/20/16 1222 Last data filed at 06/20/16 0842  Gross per 24 hour  Intake 206.89 ml  Output   1200 ml  Net -993.11 ml    General:Pt is , in no acute distress Neck:  JVP is diffiuclt to assess   Heart: Regular rate and rhythm, without murmurs, rubs, gallops.  Lungs: Clear to auscultation.  No rales or wheezes. Exemities:  Tr edema.   Neuro: Grossly intact, nonfocal.   Lab Results: Results for orders placed or performed during the hospital encounter of 06/10/16 (from the past 24 hour(s))  Troponin I     Status: None   Collection Time: 06/19/16  3:23 PM  Result Value Ref Range   Troponin I <0.03 <0.03 ng/mL  Brain natriuretic peptide     Status: None   Collection Time: 06/19/16  3:23 PM  Result Value Ref Range   B Natriuretic Peptide 30.6 0.0 - 100.0 pg/mL  Troponin I     Status: None   Collection Time: 06/19/16  8:44 PM  Result Value Ref Range   Troponin I <0.03 <0.03 ng/mL  Heparin level (unfractionated)     Status: None   Collection Time: 06/19/16  8:44 PM  Result Value Ref Range   Heparin Unfractionated 0.32 0.30 - 0.70 IU/mL  Troponin I     Status: None   Collection Time: 06/20/16  3:13 AM  Result Value Ref Range   Troponin I <0.03 <0.03 ng/mL  Basic metabolic panel     Status: Abnormal   Collection Time: 06/20/16  3:13 AM  Result Value Ref Range   Sodium 137 135 - 145 mmol/L   Potassium 3.3 (L) 3.5 - 5.1 mmol/L   Chloride 96 (L) 101 - 111 mmol/L   CO2 33 (H) 22 - 32 mmol/L   Glucose, Bld 98 65 - 99 mg/dL   BUN <5 (L) 6 - 20 mg/dL   Creatinine, Ser 1.96 0.61 - 1.24 mg/dL     Calcium 9.2 8.9 - 22.2 mg/dL   GFR calc non Af Amer >60 >60 mL/min   GFR calc Af Amer >60 >60 mL/min   Anion gap 8 5 - 15  CBC     Status: Abnormal   Collection Time: 06/20/16  3:13 AM  Result Value Ref Range   WBC 6.5 4.0 - 10.5 K/uL   RBC 4.08 (L) 4.22 - 5.81 MIL/uL   Hemoglobin 12.6 (L) 13.0 - 17.0 g/dL   HCT 97.9 (L) 89.2 - 11.9 %   MCV 94.4 78.0 - 100.0 fL   MCH 30.9 26.0 - 34.0 pg   MCHC 32.7 30.0 - 36.0 g/dL   RDW 41.7 40.8 - 14.4 %   Platelets 401 (H) 150 - 400 K/uL  Heparin level (unfractionated)     Status: Abnormal   Collection Time: 06/20/16  3:14 AM  Result Value Ref Range   Heparin Unfractionated 0.25 (L) 0.30 - 0.70 IU/mL  Heparin  level (unfractionated)     Status: None   Collection Time: 06/20/16 11:08 AM  Result Value Ref Range   Heparin Unfractionated 0.47 0.30 - 0.70 IU/mL    Studies/Results: Ct Angio Chest Pe W Or Wo Contrast  06/19/2016  ADDENDUM REPORT: 06/19/2016 14:57 ADDENDUM: I discussed the findings of this CTA chest with Dr. Isidoro Donning at 2:48 p.m. Also I had Dr. Archer Asa review the case and Interventional Radiology would not be helpful since there is no evidence of right heart strain present. Electronically Signed   By: Dwyane Dee M.D.   On: 06/19/2016 14:57  06/19/2016  CLINICAL DATA:  Mid chest tightness, some shortness of breath EXAM: CT ANGIOGRAPHY CHEST WITH CONTRAST TECHNIQUE: Multidetector CT imaging of the chest was performed using the standard protocol during bolus administration of intravenous contrast. Multiplanar CT image reconstructions and MIPs were obtained to evaluate the vascular anatomy. CONTRAST:  100 cc Isovue 370 COMPARISON:  Portable chest x-ray of 06/10/2016 FINDINGS: There is a large pulmonary embolus which extends from the distal main right pulmonary artery origin into the right upper lobe and right lower lobe branches partially occluding branches to both the right upper lobe and right lower lobe segments. No definite pulmonary emboli  are seen involving the left pulmonary artery and its branches. The RV over LV ratio is within normal limits, with no evidence of right heart strain. The thoracic aorta is not well opacified but no acute abnormality is seen. The mid ascending thoracic aorta measures 32 mm in diameter. There is a large amount of epicardial fat present. No mediastinal or hilar adenopathy is seen. On images through the upper abdomen there may be a tiny faintly calcified gallstone layering dependently within the gallbladder. The thyroid gland is unremarkable. On lung window images, there is bibasilar atelectasis present. Within the posterior medial right lower lobe there is peripheral parenchymal opacity suspicious for a focus of pulmonary infarction in view of the degree of largely occlusive pulmonary embolism to that segment. Some mosaic profusion also is present as a result of the vascular abnormality. No lung nodule or mass is seen. The central airway is patent. There are degenerative changes throughout the mid to lower thoracic spine. Review of the MIP images confirms the above findings. IMPRESSION: 1. Significant pulmonary emboli involving the distal right main pulmonary artery extending into branches to the right upper lobe and right lower lobe with partial occlusion to both the right upper lobe and lower lobe branches. 2. Peripheral wedge-shaped parenchymal opacity in the posterior medial right lower lobe suspicious for a focus of infarction in view of the degree of partially occlusive pulmonary emboli to that segment. Electronically Signed: By: Dwyane Dee M.D. On: 06/19/2016 14:45    Medications:  @  1  NICM   Will switch to oral lasix  80 bid  Add 20 KCL  Additional supplement today  Keep on other agents Volume will need to be followed closely  I am not sure what his final needs will be  Depends onf fluid intake, salt intake and recovery from PE    2  PE  Explains dyspnea  On heparin  Will need to switch to  oral agent  LOS: 10 days   Dietrich Pates 06/20/2016, 12:22 PM

## 2016-06-20 NOTE — Progress Notes (Signed)
SATURATION QUALIFICATIONS: (This note is used to comply with regulatory documentation for home oxygen)  Patient Saturations on Room Air at Rest = 90%  Patient Saturations on Room Air while Ambulating = 82-86%  Patient Saturations on 3 Liters of oxygen = 96%  Please briefly explain why patient needs home oxygen:desaturation Charlotte Crumb, PT DPT  [phone]

## 2016-06-20 NOTE — Progress Notes (Signed)
RT set up CPAP and attempted to place mask on patient. Patient complained of too much pressure and RT changed EPAP to 5cm H20. Patient still stated that the pressure was too high and refused to wear CPAP. Patient requested to be placed on NRB for the night. Patient wore NRB last night per RN. RT explained to patient that if he decided to try the CPAP again later to have nurse call RT. Patient understands little english, So RT informed RN about pt not wanting to wear CPAP at this time. RT will continue to monitor as needed.

## 2016-06-20 NOTE — Progress Notes (Signed)
ANTICOAGULATION CONSULT NOTE - Follow Up Consult  Pharmacy Consult for Heparin  Indication: pulmonary embolus  No Known Allergies  Patient Measurements: Height: 5\' 6"  (167.6 cm) Weight: 265 lb 3.2 oz (120.294 kg) IBW/kg (Calculated) : 63.8  Vital Signs: Temp: 98.4 F (36.9 C) (07/20 2352) Temp Source: Oral (07/20 2352) BP: 109/62 mmHg (07/20 2352) Pulse Rate: 84 (07/20 2352)  Labs:  Recent Labs  06/18/16 0544 06/19/16 1523 06/19/16 2044 06/20/16 0313 06/20/16 0314  HGB  --   --   --  12.6*  --   HCT  --   --   --  38.5*  --   PLT  --   --   --  401*  --   HEPARINUNFRC  --   --  0.32  --  0.25*  CREATININE 0.76  --   --   --   --   TROPONINI  --  <0.03 <0.03  --   --     Estimated Creatinine Clearance: 123 mL/min (by C-G formula based on Cr of 0.76).  Assessment: Heparin for new onset PE, HL sub-therapeutic this AM, no issues per RN.  Goal of Therapy:  Heparin level 0.3-0.7 units/ml Monitor platelets by anticoagulation protocol: Yes   Plan:  -Heparin 2000 units BOLUS -Increase heparin drip to 1850 units/hr -1100 HL  Justin Sampson 06/20/2016,3:35 AM

## 2016-06-20 NOTE — Care Management Note (Signed)
Case Management Note  Patient Details  Name: Justin Sampson MRN: [id number] Date of Birth: 1956/02/24   Subjective/Objective:Pt admitted for CHF. Pt is without insurance. Has support of friends at d/c.     Action/Plan: Dr Gwenlyn Perking interested in having some HH services for the patient at home for wound care. Pt does not have insurance and so referral made to [facility]. Tiffany with [facility] to see if patient qualifies for charity services. MD informed need for orders for wound care and for [facility] services. CM will continue to follow for d/c needs.    Expected Discharge Date:  06/13/16               Expected Discharge Plan:  Home w Home Health Services  In-House Referral:     Discharge planning Services  CM Consult, [facility], Follow-up appt scheduled, Medication Assistance (All Medications picked up at the [facility] except for Lasix. Pt will need to utilize [employer] for Lasix po. )  Post Acute Care Choice:    Choice offered to:     DME Arranged:  Continuous positive airway pressure (CPAP) DME Agency:  [facility]  HH Arranged:  RN, Social Work [employer] Agency:  [facility]  Status of Service:  Completed, signed off  If discussed at [employer], dates discussed:    Additional Comments: 1459 06-20-16 Tomi Bamberger, RN,BSN [id number] DME referral sent to [facility] for CPAP- order placed and will need settings stated. Staff RN aware. [facility] to work with pt in regards to [facility]. CM did speak with [facility]. She was able to get pt a hospital f/u at the [facility]-placed on AVS Form. Pt will have HH Services via [facility] for wound care and disease/ medication management. Pt will need to have interpreter at d/c to make sure he understands d/c plan. No further needs from CM at this time.   Gala Lewandowsky, RN 06/20/2016, 2:59 PM

## 2016-06-21 LAB — CBC
HCT: 36.7 % — ABNORMAL LOW (ref 39.0–52.0)
HEMOGLOBIN: 12.3 g/dL — AB (ref 13.0–17.0)
MCH: 31.1 pg (ref 26.0–34.0)
MCHC: 33.5 g/dL (ref 30.0–36.0)
MCV: 92.9 fL (ref 78.0–100.0)
Platelets: 418 10*3/uL — ABNORMAL HIGH (ref 150–400)
RBC: 3.95 MIL/uL — ABNORMAL LOW (ref 4.22–5.81)
RDW: 14.7 % (ref 11.5–15.5)
WBC: 6.7 10*3/uL (ref 4.0–10.5)

## 2016-06-21 LAB — BASIC METABOLIC PANEL
ANION GAP: 9 (ref 5–15)
BUN: 8 mg/dL (ref 6–20)
CALCIUM: 9.3 mg/dL (ref 8.9–10.3)
CO2: 30 mmol/L (ref 22–32)
Chloride: 96 mmol/L — ABNORMAL LOW (ref 101–111)
Creatinine, Ser: 1.16 mg/dL (ref 0.61–1.24)
GFR calc Af Amer: >60 mL/min (ref >60)
GFR calc non Af Amer: >60 mL/min (ref >60)
GLUCOSE: 91 mg/dL (ref 65–99)
Potassium: 3.6 mmol/L (ref 3.5–5.1)
Sodium: 135 mmol/L (ref 135–145)

## 2016-06-21 MED ORDER — FUROSEMIDE 80 MG PO TABS: 80.0000 mg | ORAL_TABLET | Freq: Two times a day (BID) | ORAL | Status: DC

## 2016-06-21 MED ORDER — CIPROFLOXACIN HCL 500 MG PO TABS: 500.0000 mg | ORAL_TABLET | Freq: Two times a day (BID) | ORAL | Status: DC

## 2016-06-21 MED ORDER — POLYETHYLENE GLYCOL 3350 17 G PO PACK
17.0000 g | PACK | Freq: Once | ORAL | Status: AC
  Administered 2016-06-21: 17 g via ORAL
  Filled 2016-06-21: qty 1

## 2016-06-21 MED ORDER — FUROSEMIDE 10 MG/ML IJ SOLN
80.0000 mg | Freq: Once | INTRAMUSCULAR | Status: AC
  Administered 2016-06-21: 80 mg via INTRAVENOUS
  Filled 2016-06-21: qty 8

## 2016-06-21 MED ORDER — FUROSEMIDE 80 MG PO TABS
80.0000 mg | ORAL_TABLET | Freq: Two times a day (BID) | ORAL | Status: DC
  Administered 2016-06-21 – 2016-06-22 (×2): 80 mg via ORAL
  Filled 2016-06-21 (×2): qty 1

## 2016-06-21 MED ORDER — POTASSIUM CHLORIDE CRYS ER 20 MEQ PO TBCR: 20.0000 meq | EXTENDED_RELEASE_TABLET | Freq: Two times a day (BID) | ORAL | Status: DC

## 2016-06-21 NOTE — Plan of Care (Signed)
Notified by the RN that patient mentioned to the pharmacist (while receiving instructions about the xarelto) that he noticed blood in his stool today morning. Nobody witnessed the blood, will hold discharge. Check FOBT, hold next xarelto dose. H/H is stable. If FOBT positive, will hold xarelto and discuss with GI. If patient has frank bleeding, will need IVC filter.    [name] M.D. [facility] 06/21/2016, 3:11 PM  Pager: [phone]

## 2016-06-21 NOTE — Progress Notes (Signed)
   Subjective: Pt is not too communicative  No CP   Objective: Filed Vitals:   06/20/16 1732 06/20/16 2009 06/20/16 2300 06/21/16 0500  BP: 106/48 96/81 100/62 99/54  Pulse: 68 71 61 86  Temp: 98 F (36.7 C) 98.7 F (37.1 C) 98.6 F (37 C) 97.4 F (36.3 C)  TempSrc: Oral  Oral Oral  Resp:   18 18  Height:      Weight:    260 lb (117.935 kg)  SpO2: 98% 97% 99% 98%   Weight change: -1 lb 3.9 oz (-0.565 kg)  Intake/Output Summary (Last 24 hours) at 06/21/16 0747 Last data filed at 06/20/16 0842  Gross per 24 hour  Intake      0 ml  Output    400 ml  Net   -400 ml    General: Alert, awake, oriented x3, in no acute distress Neck:  Neck is full   Heart: Regular rate and rhythm, without murmurs, rubs, gallops.  Lungs: Rales at bases  Exemities:  Tr  edema.   Neuro: Grossly intact, nonfocal. Telel  SR/ ST    Lab Results: Results for orders placed or performed during the hospital encounter of 06/10/16 (from the past 24 hour(s))  Heparin level (unfractionated)     Status: None   Collection Time: 06/20/16 11:08 AM  Result Value Ref Range   Heparin Unfractionated 0.47 0.30 - 0.70 IU/mL  Basic metabolic panel     Status: Abnormal   Collection Time: 06/21/16  3:48 AM  Result Value Ref Range   Sodium 135 135 - 145 mmol/L   Potassium 3.6 3.5 - 5.1 mmol/L   Chloride 96 (L) 101 - 111 mmol/L   CO2 30 22 - 32 mmol/L   Glucose, Bld 91 65 - 99 mg/dL   BUN 8 6 - 20 mg/dL   Creatinine, Ser 3.00 0.61 - 1.24 mg/dL   Calcium 9.3 8.9 - 92.3 mg/dL   GFR calc non Af Amer >60 >60 mL/min   GFR calc Af Amer >60 >60 mL/min   Anion gap 9 5 - 15  CBC     Status: Abnormal   Collection Time: 06/21/16  3:48 AM  Result Value Ref Range   WBC 6.7 4.0 - 10.5 K/uL   RBC 3.95 (L) 4.22 - 5.81 MIL/uL   Hemoglobin 12.3 (L) 13.0 - 17.0 g/dL   HCT 30.0 (L) 76.2 - 26.3 %   MCV 92.9 78.0 - 100.0 fL   MCH 31.1 26.0 - 34.0 pg   MCHC 33.5 30.0 - 36.0 g/dL   RDW 33.5 45.6 - 25.6 %   Platelets 418 (H) 150  - 400 K/uL    Studies/Results: No results found.  Medications: Reviewed   @PROBHOSP @  1  Nonischemic CM  I would give IV lasix today  VOlume will be a challenge as an outpt    Pt will need at least 80 mg po bid    2  PE  Anticoagulation    LOS: 11 days   Dietrich Pates 06/21/2016, 7:47 AM

## 2016-06-21 NOTE — Progress Notes (Signed)
[facility]                                                                              Patient Demographics  Justin Sampson, is a 60 y.o. male, DOB - 01-10-1956, ZOX:[id number]  Admit date - 06/10/2016   Admitting Physician Delano Metz, MD  Outpatient Primary MD for the patient is No primary care provider on file.  Outpatient specialists:   LOS - 11  days    Chief Complaint  Patient presents with  . Wound Infection       Brief summary   60 y.o. HM SPANISH SPEAKING ONLY PMHx HTN and obesity who presented to ED with "two sores on his bottom", very painful and weren't healing. In ED pt was hypotensive. ED MD noted black stool on rectal exam and started a PPI drip. CT pelvis showed gluteal abscess tracking towards the prostate with soft-tissue gas. He was taken to the OR by Dr Corliss Skains with drainage/debridement of a large abscess.      Assessment & Plan    Right gluteal abscess  -7/11 S/P I&D and wound care per [facility] Surgery -zosyn discontinued on 7/15 and following cultures results patient started on ceftin. wound cultures showed Escherichia coli, strep viridans  -vancomycin discontinued on 7/14 -continue supportive care and increase activity.   - Blood cultures negative, sepsis ruled out.  - Reevaluated by surgery, recommended continue dressing changes BID, will need home health  Acute on Chronic systolic heart failure: but new diagnosis for him  -per Echo EF 35-40%, Cardiac cath with normal coronaries  - Cardiology following, continue IV Lasix for diuresis, negative balance of 3.2 L, cardiology to transition patient to oral Lasix - Heart healthy, low-sodium diet, Fluid restriction   Right pulmonary embolism- acute -CT angiogram of the chest showed significant pulmonary emboli involving the distal right main pulmonary artery extending into the branches in the right upper lobe, right lower lobe, wedge shaped parenchymal opacity in the posterior medial right  lower lobe suspicious for infarction. - Patient was started on IV heparin, discussed in detail with the patient through the translator regarding Coumadin/Lovenox versus NOAC's for anti-coagulation. He prefers NOAC's , started on xarelto. Case management assisting [facility]. Outpatient appointments arranged. - Repeat 2-D echo showed EF 20-25% with hypokinesis, RV function stable.  - No thrombolysis per IR, pulmonology - Notified by RN that patient mentioned to the pharmacist (while receiving discharge instructions about the xarelto) that he noticed blood in his stool today morning. Nobody witnessed the blood, will hold discharge. Check FOBT, hold next xarelto dose. H/H is stable. If FOBT positive, will hold xarelto and discuss with GI. If patient has frank bleeding, will need IVC filter.   Hx of hypertension: presented with Hypotension  -BP stable, on beta blocker, lisinopril , Lasix  Melena?- Normocytic anemia  -black stool according to ED MD, no clear evidence of active bleeding appreciated during hospitalization. "drop" likely due to aggressive volume expansion and blood loss during surgery; instead of GIB  -will continue pepcid, H&H has remained stable - check FOBT now   Renal insuff -Resolved With IVF's -  renal function WNL now  NSVT -Resolved, keep magnesium around 2, potassium around 4 -Echocardiogram demonstrating EF 35-40% and some wall motion abnormalities   Hypomagnesemia, Hypomagnesemia replaced   Morbid Obesity -  -Body mass index is 46.21 kg/(m^2). -low calorie diet and exercise discussed with patient   GERD -will continue famotidine  Presumed OSA -will need outpatient sleep study and most likely CPAP at bedtime  Acute resp failure with hypoxia: Improving, likely due to CHF exacerbation, OSA and PE   Code Status: Full CODE STATUS DVT Prophylaxis:   SCD's Family Communication: Discussed in detail with the patient, all  imaging results, lab results explained to the patient  Disposition Plan:    Time Spent in minutes  25 minutes  Procedures:  7/11 I&D large R gluteal abscess in OR   2-D Echo: - Left ventricle: The cavity size was normal. Systolic function was  moderately reduced. The estimated ejection fraction was in the  range of 35% to 40%. Hypokinesis of the lateral and inferior  myocardium. - Left atrium: The atrium was moderately dilated.  Left Heart cath: 7/17  Consultants:   Cardiology   Antimicrobials:   Zosyn 7/11 >7/14 Vanc 7/11 >7/15 ceftin 7/15   Medications  Scheduled Meds: . antiseptic oral rinse  7 mL Mouth Rinse BID  . carvedilol  6.25 mg Oral BID WC  . cefUROXime  500 mg Oral BID WC  . famotidine  20 mg Oral Daily  . furosemide  80 mg Oral BID  . lisinopril  5 mg Oral Daily  . potassium chloride  20 mEq Oral BID  . rivaroxaban  15 mg Oral BID WC   Followed by  . [START ON 07/12/2016] rivaroxaban  20 mg Oral Q breakfast  . sodium chloride flush  3 mL Intravenous Q12H  . sodium chloride flush  3 mL Intravenous Q12H   Continuous Infusions:   PRN Meds:.sodium chloride, sodium chloride, [DISCONTINUED] acetaminophen **OR** acetaminophen, acetaminophen, ondansetron (ZOFRAN) IV, ondansetron **OR** [DISCONTINUED] ondansetron (ZOFRAN) IV, oxyCODONE, sodium chloride flush, sodium chloride flush   Antibiotics   Anti-infectives    Start     Dose/Rate Route Frequency Ordered Stop   06/21/16 0000  ciprofloxacin (CIPRO) 500 MG tablet  Status:  Discontinued     500 mg Oral 2 times daily 06/21/16 0918 06/21/16    06/21/16 0000  ciprofloxacin (CIPRO) 500 MG tablet     500 mg Oral 2 times daily 06/21/16 0919     06/20/16 0000  cefUROXime (CEFTIN) 500 MG tablet  Status:  Discontinued     500 mg Oral 2 times daily with meals 06/20/16 1045 06/21/16    06/14/16 1700  cefUROXime (CEFTIN) tablet 500 mg     500 mg Oral 2 times daily with meals 06/14/16 1458     06/11/16 1000   vancomycin (VANCOCIN) IVPB 1000 mg/200 mL premix  Status:  Discontinued     1,000 mg 200 mL/hr over 60 Minutes Intravenous Every 12 hours 06/11/16 0915 06/13/16 1400   06/11/16 0700  vancomycin (VANCOCIN) IVPB 750 mg/150 ml premix  Status:  Discontinued     750 mg 150 mL/hr over 60 Minutes Intravenous Every 12 hours 06/10/16 1721 06/11/16 0915   06/10/16 2300  piperacillin-tazobactam (ZOSYN) IVPB 3.375 g  Status:  Discontinued     3.375 g 12.5 mL/hr over 240 Minutes Intravenous Every 8 hours 06/10/16 1721 06/14/16 1458   06/10/16 1745  vancomycin (VANCOCIN) IVPB 1000 mg/200 mL premix     1,000 mg  200 mL/hr over 60 Minutes Intravenous  Once 06/10/16 1632 06/10/16 1839   06/10/16 1630  piperacillin-tazobactam (ZOSYN) IVPB 3.375 g     3.375 g 100 mL/hr over 30 Minutes Intravenous  Once 06/10/16 1624 06/10/16 1814   06/10/16 1630  vancomycin (VANCOCIN) IVPB 1000 mg/200 mL premix     1,000 mg 200 mL/hr over 60 Minutes Intravenous  Once 06/10/16 1624 06/10/16 1945        Subjective:   Justin Sampson was seen and examined today. Patient had no complaints at the time of my evaluation. Denied any chest pain.  No worsening of shortness of breath today.  No acute events overnight.    Objective:   Filed Vitals:   06/21/16 0814 06/21/16 0843 06/21/16 1032 06/21/16 1153  BP: 110/72 115/70 100/57 108/79  Pulse: 85 76  67  Temp: 97.6 F (36.4 C)   97.7 F (36.5 C)  TempSrc: Oral   Oral  Resp: 18   18  Height:      Weight:      SpO2: 93%   97%   No intake or output data in the 24 hours ending 06/21/16 1512   Wt Readings from Last 3 Encounters:  06/21/16 117.935 kg (260 lb)     Exam  General: Alert and oriented x 3, NAD  HEENT:   Neck: Supple, no JVD, no masses  Cardiovascular: S1 S2 clear. RRR  Respiratory: dec BS at bases  Gastrointestinal: Soft, nontender, nondistended, + bowel sounds  Ext: no cyanosis clubbing, trace edema  Neuro: no new deficits  Skin:  Dressing  intact  psych: Normal affect and demeanor, alert and oriented x3    Data Reviewed:  I have personally reviewed following labs and imaging studies  Micro Results No results found for this or any previous visit (from the past 240 hour(s)).  Radiology Reports Ct Angio Chest Pe W Or Wo Contrast  06/19/2016  ADDENDUM REPORT: 06/19/2016 14:57 ADDENDUM: I discussed the findings of this CTA chest with Dr. Isidoro Donning at 2:48 p.m. Also I had Dr. Archer Asa review the case and Interventional Radiology would not be helpful since there is no evidence of right heart strain present. Electronically Signed   By: Dwyane Dee M.D.   On: 06/19/2016 14:57  06/19/2016  CLINICAL DATA:  Mid chest tightness, some shortness of breath EXAM: CT ANGIOGRAPHY CHEST WITH CONTRAST TECHNIQUE: Multidetector CT imaging of the chest was performed using the standard protocol during bolus administration of intravenous contrast. Multiplanar CT image reconstructions and MIPs were obtained to evaluate the vascular anatomy. CONTRAST:  100 cc Isovue 370 COMPARISON:  Portable chest x-ray of 06/10/2016 FINDINGS: There is a large pulmonary embolus which extends from the distal main right pulmonary artery origin into the right upper lobe and right lower lobe branches partially occluding branches to both the right upper lobe and right lower lobe segments. No definite pulmonary emboli are seen involving the left pulmonary artery and its branches. The RV over LV ratio is within normal limits, with no evidence of right heart strain. The thoracic aorta is not well opacified but no acute abnormality is seen. The mid ascending thoracic aorta measures 32 mm in diameter. There is a large amount of epicardial fat present. No mediastinal or hilar adenopathy is seen. On images through the upper abdomen there may be a tiny faintly calcified gallstone layering dependently within the gallbladder. The thyroid gland is unremarkable. On lung window images, there is bibasilar  atelectasis present. Within the posterior  medial right lower lobe there is peripheral parenchymal opacity suspicious for a focus of pulmonary infarction in view of the degree of largely occlusive pulmonary embolism to that segment. Some mosaic profusion also is present as a result of the vascular abnormality. No lung nodule or mass is seen. The central airway is patent. There are degenerative changes throughout the mid to lower thoracic spine. Review of the MIP images confirms the above findings. IMPRESSION: 1. Significant pulmonary emboli involving the distal right main pulmonary artery extending into branches to the right upper lobe and right lower lobe with partial occlusion to both the right upper lobe and lower lobe branches. 2. Peripheral wedge-shaped parenchymal opacity in the posterior medial right lower lobe suspicious for a focus of infarction in view of the degree of partially occlusive pulmonary emboli to that segment. Electronically Signed: By: Dwyane Dee M.D. On: 06/19/2016 14:45   Ct Abdomen Pelvis W Contrast  06/10/2016  CLINICAL DATA:  Perirectal pain with focal wound EXAM: CT ABDOMEN AND PELVIS WITH CONTRAST TECHNIQUE: Multidetector CT imaging of the abdomen and pelvis was performed using the standard protocol following bolus administration of intravenous contrast. CONTRAST:  100 mL ISOVUE-300 IOPAMIDOL (ISOVUE-300) INJECTION 61% COMPARISON:  None. FINDINGS: Lower chest:  No acute findings. Hepatobiliary: The liver is mildly fatty infiltrated. The gallbladder is within normal limits. Pancreas: No mass, inflammatory changes, or other significant abnormality. Spleen: Within normal limits in size and appearance. Adrenals/Urinary Tract: No masses identified. No evidence of hydronephrosis. Small right renal cyst is seen. The bladder is well distended. Stomach/Bowel: No evidence of obstruction, inflammatory process, or abnormal fluid collections. The appendix is within normal limits. Mild  diverticular change is noted without evidence of diverticulitis. Vascular/Lymphatic: No pathologically enlarged lymph nodes. No evidence of abdominal aortic aneurysm. Reproductive: No mass or other significant abnormality. Other: Fat containing inguinal hernia is seen. In the right gluteal region medially of there is some inflammatory change and a small amount of subcutaneous air likely representing inflammation. Additionally more superiorly adjacent to the prostate and rectum there are areas of air and fluid identified. The largest of these lies just lateral to the prostate and measures 3.4 x 2.4 x 14.7 cm in greatest AP, transverse and craniocaudad projections respectively. This is best visualized on image number 99 of series 2 as well as images 111 of series 5 and 97 of series 6. These are consistent with small abscesses Musculoskeletal:  No suspicious bone lesions identified. IMPRESSION: Air-fluid collections adjacent to the prostate laterally on the right consistent with abscesses. Smaller collection is noted somewhat more posteriorly adjacent to the distal rectum. Additionally some inflammatory changes in the right gluteal region medially are seen. Electronically Signed   By: Alcide Clever M.D.   On: 06/10/2016 18:21   Dg Chest Port 1 View  06/10/2016  CLINICAL DATA:  Possible sepsis EXAM: PORTABLE CHEST 1 VIEW COMPARISON:  None. FINDINGS: The heart size is enlarged. There is no focal infiltrate, pulmonary edema, or pleural effusion. The lung volumes are low. Mediastinal contour is unremarkable given the AP semi-erect technique. The visualized skeletal structures are unremarkable. IMPRESSION: Cardiomegaly.  No focal pneumonia. Electronically Signed   By: Sherian Rein M.D.   On: 06/10/2016 16:47    Lab Data:  CBC:  Recent Labs Lab 06/16/16 0241 06/20/16 0313 06/21/16 0348  WBC 6.0 6.5 6.7  HGB 11.5* 12.6* 12.3*  HCT 34.9* 38.5* 36.7*  MCV 93.3 94.4 92.9  PLT 399 401* 418*   Basic Metabolic  Panel:  Recent Labs Lab 06/15/16 0308 06/16/16 0241 06/18/16 0544 06/20/16 0313 06/21/16 0348  NA  --  136 140 137 135  K  --  3.6 3.6 3.3* 3.6  CL  --  101 99* 96* 96*  CO2  --  28 32 33* 30  GLUCOSE  --  92 93 98 91  BUN  --  <5* <5* <5* 8  CREATININE  --  0.71 0.76 0.86 1.16  CALCIUM  --  8.8* 9.1 9.2 9.3  MG 1.7  --  1.8  --   --    GFR: Estimated Creatinine Clearance: 83.8 mL/min (by C-G formula based on Cr of 1.16). Liver Function Tests: No results for input(s): AST, ALT, ALKPHOS, BILITOT, PROT, ALBUMIN in the last 168 hours. No results for input(s): LIPASE, AMYLASE in the last 168 hours. No results for input(s): AMMONIA in the last 168 hours. Coagulation Profile:  Recent Labs Lab 06/16/16 0928  INR 1.70*   Cardiac Enzymes:  Recent Labs Lab 06/19/16 1523 06/19/16 2044 06/20/16 0313  TROPONINI <0.03 <0.03 <0.03   BNP (last 3 results) No results for input(s): PROBNP in the last 8760 hours. HbA1C: No results for input(s): HGBA1C in the last 72 hours. CBG: No results for input(s): GLUCAP in the last 168 hours. Lipid Profile: No results for input(s): CHOL, HDL, LDLCALC, TRIG, CHOLHDL, LDLDIRECT in the last 72 hours. Thyroid Function Tests: No results for input(s): TSH, T4TOTAL, FREET4, T3FREE, THYROIDAB in the last 72 hours. Anemia Panel: No results for input(s): VITAMINB12, FOLATE, FERRITIN, TIBC, IRON, RETICCTPCT in the last 72 hours. Urine analysis:    Component Value Date/Time   COLORURINE YELLOW 06/10/2016 1850   APPEARANCEUR CLEAR 06/10/2016 1850   LABSPEC 1.029 06/10/2016 1850   PHURINE 6.0 06/10/2016 1850   GLUCOSEU NEGATIVE 06/10/2016 1850   HGBUR NEGATIVE 06/10/2016 1850   BILIRUBINUR NEGATIVE 06/10/2016 1850   KETONESUR NEGATIVE 06/10/2016 1850   PROTEINUR NEGATIVE 06/10/2016 1850   NITRITE NEGATIVE 06/10/2016 1850   LEUKOCYTESUR NEGATIVE 06/10/2016 1850     Bonnye Halle M.D. [facility] 06/21/2016, 3:12 PM  Pager:  [phone] Between 7am to 7pm - call Pager - [phone]  After 7pm go to [email or web address] - password [email or web address]  Call night coverage person covering after 7pm

## 2016-06-21 NOTE — Discharge Instructions (Signed)
Rivaroxaban TREATMENT (SPANISH) 1/15 Informacin sobre mi medicina - XARELTO (rivaroxaban) POR QU SE LE RECET XARELTO? Xarelto se le recet para tratar los cogulos de sangre que pueden haber sido encontrados en las venas de las piernas (trombosis venosa profunda) o en los pulmones (embolia pulmonar) y para reducir el riesgo de que se vuelvan a producir. QU NECESITA SABER SOBRE XARELTO? La dosis inicial es de una pastilla de 15 mg tomada DOS [employer] al da con comida por los PRIMEROS 21 DAS, luego el (anote la fecha) _____8/12/17_______ la dosis se cambia a una pastilla de 20 mg tomada UNA VEZ AL DA con la cena. NO deje de tomar Xarelto sin hablar con el proveedor de atencin Therapist, music. Vuelva a surtir su receta de pastillas de 20 mg antes de que se le acaben. Despus de darle de alta, debe tener citas de control regulares con el proveedor de atencin mdica que le est recetando su Xarelto. En el futuro su dosis puede ser cambiada si hay cambios en su funcin renal en una cantidad significativa. QU HACER SI SE LE OLVIDA TOMAR UNA DOSIS? Si usted est tomando Xarelto DOS [place] AL DA y Delphina Cahill dosis, tmela tan pronto lo recuerde. Puede tomar [employer] de 15 mg (un total de 30 mg) al mismo tiempo, luego al da siguiente contine con su horario regular de 15 mg [employer]. Si usted est tomando Xarelto UNA VEZ AL DA y Delphina Cahill dosis, tmela tan pronto lo recuerde en el mismo da, luego al da siguiente contine con su horario regular de Medical sales representative. No tome dos dosis de Xarelto al [employer]. INFORMACIN IMPORTANTE DE SEGURIDAD Xarelto es un medicamento anticoagulante que puede causar sangrado. Debe llamar a su proveedor de atencin mdica de inmediato si usted experimenta cualquiera de los siguientes: ? Sangrado de una lesin o de la nariz que no cesa de parar. ? Color extrao de la orina (rojo o marr?n oscuro) o de las heces  fecales (rojo o negro). ? Moretones raros por [employer]. ? Una cada grave o si se golpea la cabeza (incluso si no hay sangrado). Algunos medicamentos pueden interactuar con Xarelto y podran aumentar su riesgo de sangrado mientras que est tomando Xarelto. Para ayudar a evitar esto, consulte con su proveedor de atencin mdica o con su farmacutico antes de usar cualquier medicamento nuevo con receta o sin receta, incluyendo hierbas, vitaminas, medicamentos antiinflamatorios sin esteroides (AINEs) y suplementos. Esta educacin [employer] se revis conmigo o con mi representante de atencin mdica como parte de mi preparacin al darme de alta. Esta pgina web tiene ms informacin sobre Xarelto: [email or web address].br. El farmacutico que habl conmigo durante mi estada en el hospital fue: _________________________ (nombre del farmacutico). -----------------------------------------------------------------------------------------------------------------------------------------------------------------------------------------------------------------------

## 2016-06-21 NOTE — Progress Notes (Signed)
CM met with pt in room and through the [place] explained to pt he will be going home today with an O2 tank from [facility] and his mask from his CPAP.  Jermaine from [facility] was in room with pt and CM and through PI explained [facility] will set up O2 concentrator and CPAP at his home.  Address verified and [facility] has contact number of [place] speaking family member for scheduling. CM spoke with charge RN to ensure pt is discharged with both O2 tank and CPAP mask (in room).  No other CM needs were communicated.

## 2016-06-21 NOTE — Progress Notes (Signed)
Pt report seeing blood in stool today. MD notified, fecal blood test ordered. Evening Xarelto on hold.Will continue to monitor.  Colleen Can, RN

## 2016-06-21 NOTE — Discharge Summary (Signed)
Physician Discharge Summary   Patient ID: Justin Sampson MRN: [id number] DOB/AGE: 59-Nov-1957 59 y.o.  Admit date: 06/10/2016 Discharge date: 06/21/2016  Primary Care Physician:  No primary care provider on file.  Discharge Diagnoses:   . Acute hypoxic respiratory failure  . Acute right pulmonary embolism  . Gluteal abscess . 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 Hypotension . Essential hypertension . Nonsustained ventricular tachycardia (HCC) . Hypokalemia . Hypomagnesemia . obstructive sleep apnea  Consults:  Cardiology Pulmonology Interventional radiology  Recommendations for Outpatient Follow-up:  1. Please note the new medication started, patient is placed on xarelto. Patient has most of his medications arranged and given to him prior to discharge, lasix and cipro from the $4 [employer] list. All the medications were explained to him via translator and medical compliance was stressed to him. 2. Please repeat CBC/BMET at next visit.  3. Patient has sleep study scheduled, appointment with surgery and internal medicine scheduled. He was strongly counseled to keep up his appointments and be compliant. 4. DME CPAP will be arranged by [facility], he also qualified for home O2 3 L   DIET: Heart healthy diet    Allergies:  No Known Allergies   DISCHARGE MEDICATIONS: Current Discharge Medication List    START taking these medications   Details  carvedilol (COREG) 6.25 MG tablet Take 1 tablet (6.25 mg total) by mouth 2 (two) times daily with a meal. Qty: 30 tablet, Refills: 1    ciprofloxacin (CIPRO) 500 MG tablet Take 1 tablet (500 mg total) by mouth 2 (two) times daily. X 7 days Qty: 14 tablet, Refills: 0    furosemide (LASIX) 80 MG tablet Take 1 tablet (80 mg total) by mouth 2 (two) times daily. Qty: 60 tablet, Refills: 3    lisinopril (PRINIVIL,ZESTRIL) 5 MG tablet Take 1 tablet (5 mg total) by mouth daily. Qty: 30 tablet, Refills: 1     potassium chloride SA (K-DUR,KLOR-CON) 20 MEQ tablet Take 1 tablet (20 mEq total) by mouth 2 (two) times daily. Qty: 60 tablet, Refills: 3    !! Rivaroxaban (XARELTO) 15 MG TABS tablet Take 1 tablet (15 mg total) by mouth 2 (two) times daily with a meal. Start on 06/20/16 Qty: 60 tablet, Refills: 1    !! rivaroxaban (XARELTO) 20 MG TABS tablet Take 1 tablet (20 mg total) by mouth daily with breakfast. Start taking on 07/12/16 Qty: 30 tablet, Refills: 1     !! - Potential duplicate medications found. Please discuss with provider.    CONTINUE these medications which have NOT CHANGED   Details  naproxen (NAPROSYN) 500 MG tablet Take 500 mg by mouth daily as needed.      STOP taking these medications     losartan-hydrochlorothiazide (HYZAAR) 100-12.5 MG tablet          Brief H and P: For complete details please refer to admission H and P, but in brief 60 y.o. HM SPANISH SPEAKING ONLY PMHx HTN and obesity who presented to ED with "two sores on his bottom", very painful and weren't healing. In ED pt was hypotensive. ED MD noted black stool on rectal exam and started a PPI drip. CT pelvis showed gluteal abscess tracking towards the prostate with soft-tissue gas. He was taken to the OR by Dr Corliss Skains with drainage/debridement of a large abscess.     Hospital Course:  Right gluteal abscess  -7/11 S/P I&D and wound care per [facility] Surgery -zosyn discontinued on 7/15 and following cultures results  patient was started on ceftin. wound cultures showed Escherichia coli, strep viridans, discharged on oral ciprofloxacin for the sensitivities from the $4 [employer] list. Vancomycin discontinued on 7/14 -continue supportive care and increase activity patient counseled on dressing changes daily home RN was arranged  .  - Blood cultures negative, sepsis ruled out.  - Reevaluated by surgery, recommended continue dressing changes BID, will need home health RN. Outpatient surgery follow-up appointment  scheduled.  Acute hypoxic respiratory failure secondary to acute systolic CHF, right pulmonary embolism, obstructive sleep apnea, obesity hypoventilation - Improving, home O2 evaluation done, qualifies for 3 L home O2 -Outpatient sleep study scheduled, until then home CPAP arranged by [facility]  Acute on Chronic systolic and diastolic heart failure: but new diagnosis for him  -per Echo EF 35-40%, patient underwent cardiac cath which showed normal coronaries, nonobstructive coronary disease Cardiac cath with normal coronaries  - Cardiology following, patient was placed on IV Lasix for diuresis, negative balance of 3.2 L. Patient has been transitioned to oral Lasix. Compliance is still a bit concerned. Patient was found to be drinking Gatorade and orange juice in his room from the large gallon bottles.  - Heart healthy, low-sodium diet, Fluid restriction - 2-D echo was repeated on 7/21 which showed EF of 25-30% with diffuse hypokinesis, grade 1 diastolic dysfunction   Right pulmonary embolism- acute -CT angiogram of the chest showed significant pulmonary emboli involving the distal right main pulmonary artery extending into the branches in the right upper lobe, right lower lobe, wedge shaped parenchymal opacity in the posterior medial right lower lobe suspicious for infarction. - Patient was started on IV heparin, discussed in detail with the patient through the translator regarding Coumadin/Lovenox versus NOAC's for anti-coagulation. He prefers NOAC's , started on xarelto. Case management [facility] Outpatient appointments arranged. - Repeat 2-D echo showed EF of 25-30% right ventricular function normal - No thrombolysis per IR, pulmonology   Hx of hypertension: presented with Hypotension  -BP stable, on beta blocker, lisinopril , Lasix  Melena?- Normocytic anemia  -black stool according to ED MD, no clear evidence of active bleeding  appreciated during hospitalization. "drop" likely due to aggressive volume expansion and blood loss during surgery; instead of GIB  -will continue pepcid, H&H has remained stable  Renal insuff -Resolved With IVF's -renal function WNL now  NSVT -Resolved, -Echocardiogram demonstrating EF 25-30%. Continue Coreg   Hypomagnesemia, Hypomagnesemia replaced   Morbid Obesity -  -Body mass index is 46.21 kg/(m^2). -low calorie diet and exercise discussed with patient   GERD -will continue famotidine  Presumed OSA -will need outpatient sleep study and most likely CPAP at bedtime, scheduled sleep study outpatient     Day of Discharge BP 108/79 mmHg  Pulse 67  Temp(Src) 97.7 F (36.5 C) (Oral)  Resp 18  Ht 5\' 6"  (1.676 m)  Wt 117.935 kg (260 lb)  BMI 41.99 kg/m2  SpO2 97%  Physical Exam: General: Alert and awake oriented x3 not in any acute distress. HEENT: anicteric sclera, pupils reactive to light and accommodation CVS: S1-S2 clear no murmur rubs or gallops Chest: clear to auscultation bilaterally, no wheezing rales or rhonchi Abdomen: Obese, soft nontender, nondistended, normal bowel sounds Extremities: no cyanosis, clubbing or edema noted bilaterally Neuro: Cranial nerves II-XII intact, no focal neurological deficits   The results of significant diagnostics from this hospitalization (including imaging, microbiology, ancillary and laboratory) are listed below for reference.    LAB RESULTS: Basic Metabolic  Panel:  Recent Labs Lab 06/18/16 0544 06/20/16 0313 06/21/16 0348  NA 140 137 135  K 3.6 3.3* 3.6  CL 99* 96* 96*  CO2 32 33* 30  GLUCOSE 93 98 91  BUN <5* <5* 8  CREATININE 0.76 0.86 1.16  CALCIUM 9.1 9.2 9.3  MG 1.8  --   --    Liver Function Tests: No results for input(s): AST, ALT, ALKPHOS, BILITOT, PROT, ALBUMIN in the last 168 hours. No results for input(s): LIPASE, AMYLASE in the last 168 hours. No results for input(s): AMMONIA in the last 168  hours. CBC:  Recent Labs Lab 06/20/16 0313 06/21/16 0348  WBC 6.5 6.7  HGB 12.6* 12.3*  HCT 38.5* 36.7*  MCV 94.4 92.9  PLT 401* 418*   Cardiac Enzymes:  Recent Labs Lab 06/19/16 2044 06/20/16 0313  TROPONINI <0.03 <0.03   BNP: Invalid input(s): POCBNP CBG: No results for input(s): GLUCAP in the last 168 hours.  Significant Diagnostic Studies:  Ct Abdomen Pelvis W Contrast  06/10/2016  CLINICAL DATA:  Perirectal pain with focal wound EXAM: CT ABDOMEN AND PELVIS WITH CONTRAST TECHNIQUE: Multidetector CT imaging of the abdomen and pelvis was performed using the standard protocol following bolus administration of intravenous contrast. CONTRAST:  100 mL ISOVUE-300 IOPAMIDOL (ISOVUE-300) INJECTION 61% COMPARISON:  None. FINDINGS: Lower chest:  No acute findings. Hepatobiliary: The liver is mildly fatty infiltrated. The gallbladder is within normal limits. Pancreas: No mass, inflammatory changes, or other significant abnormality. Spleen: Within normal limits in size and appearance. Adrenals/Urinary Tract: No masses identified. No evidence of hydronephrosis. Small right renal cyst is seen. The bladder is well distended. Stomach/Bowel: No evidence of obstruction, inflammatory process, or abnormal fluid collections. The appendix is within normal limits. Mild diverticular change is noted without evidence of diverticulitis. Vascular/Lymphatic: No pathologically enlarged lymph nodes. No evidence of abdominal aortic aneurysm. Reproductive: No mass or other significant abnormality. Other: Fat containing inguinal hernia is seen. In the right gluteal region medially of there is some inflammatory change and a small amount of subcutaneous air likely representing inflammation. Additionally more superiorly adjacent to the prostate and rectum there are areas of air and fluid identified. The largest of these lies just lateral to the prostate and measures 3.4 x 2.4 x 14.7 cm in greatest AP, transverse and  craniocaudad projections respectively. This is best visualized on image number 99 of series 2 as well as images 111 of series 5 and 97 of series 6. These are consistent with small abscesses Musculoskeletal:  No suspicious bone lesions identified. IMPRESSION: Air-fluid collections adjacent to the prostate laterally on the right consistent with abscesses. Smaller collection is noted somewhat more posteriorly adjacent to the distal rectum. Additionally some inflammatory changes in the right gluteal region medially are seen. Electronically Signed   By: Alcide Clever M.D.   On: 06/10/2016 18:21   Dg Chest Port 1 View  06/10/2016  CLINICAL DATA:  Possible sepsis EXAM: PORTABLE CHEST 1 VIEW COMPARISON:  None. FINDINGS: The heart size is enlarged. There is no focal infiltrate, pulmonary edema, or pleural effusion. The lung volumes are low. Mediastinal contour is unremarkable given the AP semi-erect technique. The visualized skeletal structures are unremarkable. IMPRESSION: Cardiomegaly.  No focal pneumonia. Electronically Signed   By: Sherian Rein M.D.   On: 06/10/2016 16:47     Procedures    Left Heart Cath and Coronary Angiography    Conclusion     There is moderate to severe left ventricular systolic dysfunction.  No signficant CAD.  Mildly elevated LVEDP.  Nonischemic cardiomyopathy. Continue medical therapy. I also recommended weight loss to him.    2-D echo ------------------------------------------------------------------- Study Conclusions  - Left ventricle: The cavity size was mildly dilated. Wall  thickness was increased in a pattern of mild LVH. Systolic  function was severely reduced. The estimated ejection fraction  was in the range of 25% to 30%. Diffuse hypokinesis. Doppler  parameters are consistent with abnormal left ventricular  relaxation (grade 1 diastolic dysfunction). - Mitral valve: Calcified annulus. - Left atrium: The atrium was mildly  dilated.  Impressions:  - Severe global reduction in LV function; mild LVH; mild LVE; grade  1 diastolic dysfunction; mild LAE.  Disposition and Follow-up: Discharge Instructions    (HEART FAILURE PATIENTS) Call MD:  Anytime you have any of the following symptoms: 1) 3 pound weight gain in 24 hours or 5 pounds in 1 week 2) shortness of breath, with or without a dry hacking cough 3) swelling in the hands, feet or stomach 4) if you have to sleep on extra pillows at night in order to breathe.    Complete by:  As directed      Diet - low sodium heart healthy    Complete by:  As directed      Discharge instructions    Complete by:  As directed   Please take Xarelto  twice a day for 20 more days. On 07/12/16, please change to Xarelto  daily with supper. Please don't stop Xarelto until advised to stop by your doctor.  Dressing changes daily, shower with soap and water.     Increase activity slowly    Complete by:  As directed             DISPOSITION: Home   DISCHARGE FOLLOW-UP Follow-up Information    Follow up with Wynona Luna., MD. Schedule an appointment as soon as possible for a visit in 1 week.   Specialty:  General Surgery   Why:  For post-operative follow up   Contact information:   [address] [place] [address] [phone]       Follow up with [facility] On 07/29/2016.   Why:  For sleep Study @ 8:00 pm. Office will give you a call as well.    Contact information:   [address] [place] [address] [phone]      Follow up with [facility] Go on 06/25/2016.   Specialty:  General Surgery   Why:  For wound re-check. appointment is at 11:30 AM. please arrive 30 minutes early.    Contact information:   [address] [place] [address] [phone]      Follow up with [facility] On 06/26/2016.   Why:  Hospital  follow-up appointment on 06/26/16 at 9:30 am with Dr. Venetia Night.   Contact information:   [address] [place] [address] [phone]      Follow up with Dietrich Pates, MD. Schedule an appointment as soon as possible for a visit in 10 days.   Specialty:  Cardiology   Why:  for hospital follow-up   Contact information:   [address] [place] [address] [phone]       Follow up with [facility].   Why:  CPAP and home oxygen   Contact information:   [address] [place] [address] [phone]        Time spent on Discharge: 45 minutes  Signed:   RAI,RIPUDEEP M.D. [facility] 06/21/2016, 12:01 PM Pager: [phone]

## 2016-06-22 LAB — CBC
HCT: 38 % — ABNORMAL LOW (ref 39.0–52.0)
Hemoglobin: 12.5 g/dL — ABNORMAL LOW (ref 13.0–17.0)
MCH: 30.8 pg (ref 26.0–34.0)
MCHC: 32.9 g/dL (ref 30.0–36.0)
MCV: 93.6 fL (ref 78.0–100.0)
PLATELETS: 415 10*3/uL — AB (ref 150–400)
RBC: 4.06 MIL/uL — ABNORMAL LOW (ref 4.22–5.81)
RDW: 14.5 % (ref 11.5–15.5)
WBC: 6.2 10*3/uL (ref 4.0–10.5)

## 2016-06-22 LAB — BASIC METABOLIC PANEL
Anion gap: 8 (ref 5–15)
BUN: 12 mg/dL (ref 6–20)
CALCIUM: 9.2 mg/dL (ref 8.9–10.3)
CHLORIDE: 97 mmol/L — AB (ref 101–111)
CO2: 30 mmol/L (ref 22–32)
CREATININE: 1.22 mg/dL (ref 0.61–1.24)
GFR calc Af Amer: >60 mL/min (ref >60)
Glucose, Bld: 95 mg/dL (ref 65–99)
Potassium: 3.6 mmol/L (ref 3.5–5.1)
SODIUM: 135 mmol/L (ref 135–145)

## 2016-06-22 LAB — OCCULT BLOOD X 1 CARD TO LAB, STOOL: FECAL OCCULT BLD: NEGATIVE

## 2016-06-22 MED ORDER — POLYETHYLENE GLYCOL 3350 17 G PO PACK
17.0000 g | PACK | Freq: Once | ORAL | Status: DC
  Filled 2016-06-22: qty 1

## 2016-06-22 NOTE — Progress Notes (Signed)
Cm received call from discharging RN to ensure all is set up with [facility].  CM requested of RN to please send pt home with MASK, and home O2 tank on 3LNC (continuous); tank is already in pt's room.  Cm called [facility] rep, tiffany who states she has HHRN order, the [facility] DME will go to pt's home today to set up concentrator and CPAP.  [facility] has english-speaking contact number [phone].  No other CM needs were communicated.

## 2016-06-22 NOTE — Progress Notes (Signed)
Pt being discharged home with family member.  Reviewed discharge education using interpreter line.  Explained to pt the importance of taking xaralto and how to obtain his medications.  All questions answered, assessment unchanged from earlier.

## 2016-06-22 NOTE — Plan of Care (Signed)
Problem: Activity: Goal: Ability to tolerate increased activity will improve Outcome: Completed/Met Date Met: 06/22/16 Pt activity stable for discharge  Problem: Bowel/Gastric: Goal: Gastrointestinal status for postoperative course will improve Outcome: Completed/Met Date Met: 06/22/16 Pt has not experienced any difficulties regarding bowel/gastric   Problem: Cardiac: Goal: Will show no evidence of cardiac arrhythmias Outcome: Completed/Met Date Met: 06/22/16 Pt did not have nay evidence of cardiac arrhythmias during this admission   Problem: Nutrition: Goal: Ability to attain and maintain optimal nutritional status will improve Outcome: Completed/Met Date Met: 06/22/16 Pt nutritional status remains the same and is adequate   Problem: Education: Goal: Knowledge of  General Education information/materials will improve Outcome: Completed/Met Date Met: 06/22/16 Pt educated throughout entire admission regarding tests, procedures, medications, labs, and available resources   Problem: Activity: Goal: Risk for activity intolerance will decrease Outcome: Completed/Met Date Met: 06/22/16 Pt activity close to baseline and is adequate for discharge  Problem: Fluid Volume: Goal: Ability to maintain a balanced intake and output will improve Outcome: Completed/Met Date Met: 06/22/16 Pt has adequate intake and output

## 2016-06-22 NOTE — Discharge Summary (Signed)
Physician Discharge Summary   Patient ID: Justin Sampson MRN: [id number] DOB/AGE: [birth year] 60 y.o.  Admit date: 06/10/2016 Discharge date: 06/22/2016  Primary Care Physician:  No primary care provider on file.  Discharge Diagnoses:   . Acute hypoxic respiratory failure  . Acute right pulmonary embolism  . Gluteal abscess . 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 Hypotension . Essential hypertension . Nonsustained ventricular tachycardia (HCC) . Hypokalemia . Hypomagnesemia . obstructive sleep apnea  Consults:  Cardiology Pulmonology Interventional radiology  Recommendations for Outpatient Follow-up:  1. Please note the new medication started, patient is placed on xarelto. Patient has most of his medications arranged and given to him prior to discharge, lasix and cipro from the $4 [employer] list. All the medications were explained to him via translator and medical compliance was stressed to him. 2. Please repeat CBC/BMET at next visit.  3. Patient has sleep study scheduled, appointment with surgery and internal medicine scheduled. He was strongly counseled to keep up his appointments and be compliant. 4. DME CPAP will be arranged by [facility], he also qualified for home O2 3 L   DIET: Heart healthy diet    Allergies:  No Known Allergies   DISCHARGE MEDICATIONS: Current Discharge Medication List    START taking these medications   Details  carvedilol (COREG) 6.25 MG tablet Take 1 tablet (6.25 mg total) by mouth 2 (two) times daily with a meal. Qty: 30 tablet, Refills: 1    ciprofloxacin (CIPRO) 500 MG tablet Take 1 tablet (500 mg total) by mouth 2 (two) times daily. X 7 days Qty: 14 tablet, Refills: 0    furosemide (LASIX) 80 MG tablet Take 1 tablet (80 mg total) by mouth 2 (two) times daily. Qty: 60 tablet, Refills: 3    lisinopril (PRINIVIL,ZESTRIL) 5 MG tablet Take 1 tablet (5 mg total) by mouth daily. Qty: 30 tablet, Refills: 1     potassium chloride SA (K-DUR,KLOR-CON) 20 MEQ tablet Take 1 tablet (20 mEq total) by mouth 2 (two) times daily. Qty: 60 tablet, Refills: 3    !! Rivaroxaban (XARELTO) 15 MG TABS tablet Take 1 tablet (15 mg total) by mouth 2 (two) times daily with a meal. Start on 06/20/16 Qty: 60 tablet, Refills: 1    !! rivaroxaban (XARELTO) 20 MG TABS tablet Take 1 tablet (20 mg total) by mouth daily with breakfast. Start taking on 07/12/16 Qty: 30 tablet, Refills: 1     !! - Potential duplicate medications found. Please discuss with provider.    CONTINUE these medications which have NOT CHANGED   Details  naproxen (NAPROSYN) 500 MG tablet Take 500 mg by mouth daily as needed.      STOP taking these medications     losartan-hydrochlorothiazide (HYZAAR) 100-12.5 MG tablet          Brief H and P: For complete details please refer to admission H and P, but in brief 60 y.o. HM SPANISH SPEAKING ONLY PMHx HTN and obesity who presented to ED with "two sores on his bottom", very painful and weren't healing. In ED pt was hypotensive. ED MD noted black stool on rectal exam and started a PPI drip. CT pelvis showed gluteal abscess tracking towards the prostate with soft-tissue gas. He was taken to the OR by Dr Corliss Skains with drainage/debridement of a large abscess.     Hospital Course:  Right gluteal abscess  -7/11 S/P I&D and wound care per Gen Surgery -zosyn discontinued on 7/15 and following cultures results  patient was started on ceftin. wound cultures showed Escherichia coli, strep viridans, discharged on oral ciprofloxacin for the sensitivities from the $4 [employer] list. Vancomycin discontinued on 7/14 -continue supportive care and increase activity patient counseled on dressing changes daily home RN was arranged  .  - Blood cultures negative, sepsis ruled out.  - Reevaluated by surgery, recommended continue dressing changes BID, will need home health RN. Outpatient surgery follow-up appointment  scheduled. - Inspected the wound on 7/23 with RN staff, wound is clean with packing, no bleeding or discharge. HHRN arranged for dressing changes.  Acute hypoxic respiratory failure secondary to acute systolic CHF, right pulmonary embolism, obstructive sleep apnea, obesity hypoventilation - Improving, home O2 evaluation done, qualifies for 3 L home O2 -Outpatient sleep study scheduled, until then home CPAP arranged by [facility]  Acute on Chronic systolic and diastolic heart failure: but new diagnosis for him  -per Echo EF 35-40%, patient underwent cardiac cath which showed normal coronaries, nonobstructive coronary disease Cardiac cath with normal coronaries  - Cardiology following, patient was placed on IV Lasix for diuresis, negative balance of 3.2 L. Patient has been transitioned to oral Lasix. Compliance is still a bit concerned. Patient was found to be drinking Gatorade and orange juice in his room from the large gallon bottles.  - Heart healthy, low-sodium diet, Fluid restriction - 2-D echo was repeated on 7/21 which showed EF of 25-30% with diffuse hypokinesis, grade 1 diastolic dysfunction   Right pulmonary embolism- acute -CT angiogram of the chest showed significant pulmonary emboli involving the distal right main pulmonary artery extending into the branches in the right upper lobe, right lower lobe, wedge shaped parenchymal opacity in the posterior medial right lower lobe suspicious for infarction. - Patient was started on IV heparin, discussed in detail with the patient through the translator regarding Coumadin/Lovenox versus NOAC's for anti-coagulation. He prefers NOAC's , started on xarelto. Case management [facility] Outpatient appointments arranged. - Repeat 2-D echo showed EF of 25-30% right ventricular function normal - No thrombolysis per IR, pulmonology   Hx of hypertension: presented with Hypotension  -BP stable, on  beta blocker, lisinopril , Lasix  Melena?- Normocytic anemia  -black stool according to ED MD, no clear evidence of active bleeding appreciated during hospitalization. "drop" likely due to aggressive volume expansion and blood loss during surgery; instead of GIB  -will continue pepcid, H&H has remained stable - On 7/22, patient had reported blood on the stool, FOBT was negative. He had a bowel movement on 7/23, witnessed by myself, brownish, no blood, FOBT negative. Clarification through the translator, patient reported that the blood was not in the stool on 7/22 and was from the soiled dressing on his wound. Xarelto evening dose on 7/22 was held and restarted on 7/20 3 AM after stool occult test was negative.   Renal insuff -Resolved With IVF's -renal function WNL now  NSVT -Resolved, -Echocardiogram demonstrating EF 25-30%. Continue Coreg   Hypomagnesemia, Hypomagnesemia replaced   Morbid Obesity -  -Body mass index is 46.21 kg/(m^2). -low calorie diet and exercise discussed with patient   GERD -will continue famotidine  Presumed OSA -will need outpatient sleep study and most likely CPAP at bedtime, scheduled sleep study outpatient     Day of Discharge BP 117/68 (BP Location: Right Arm)   Pulse 61   Temp 98.1 F (36.7 C) (Oral)   Resp 18   Ht 5\' 6"  (1.676 m)   Wt 119.7  kg (264 lb)   SpO2 97%   BMI 42.61 kg/m   Physical Exam: General: Alert and awake oriented x3 not in any acute distress. HEENT: anicteric sclera, pupils reactive to light and accommodation CVS: S1-S2 clear no murmur rubs or gallops Chest: clear to auscultation bilaterally, no wheezing rales or rhonchi Abdomen: Obese, soft nontender, nondistended, normal bowel sounds Extremities: no cyanosis, clubbing or edema noted bilaterally Neuro: Cranial nerves II-XII intact, no focal neurological deficits   The results of significant diagnostics from this hospitalization (including imaging, microbiology,  ancillary and laboratory) are listed below for reference.    LAB RESULTS: Basic Metabolic Panel:  Recent Labs Lab 06/18/16 0544  06/21/16 0348 06/22/16 0423  NA 140  < > 135 135  K 3.6  < > 3.6 3.6  CL 99*  < > 96* 97*  CO2 32  < > 30 30  GLUCOSE 93  < > 91 95  BUN <5*  < > 8 12  CREATININE 0.76  < > 1.16 1.22  CALCIUM 9.1  < > 9.3 9.2  MG 1.8  --   --   --   < > = values in this interval not displayed. Liver Function Tests: No results for input(s): AST, ALT, ALKPHOS, BILITOT, PROT, ALBUMIN in the last 168 hours. No results for input(s): LIPASE, AMYLASE in the last 168 hours. No results for input(s): AMMONIA in the last 168 hours. CBC:  Recent Labs Lab 06/21/16 0348 06/22/16 0423  WBC 6.7 6.2  HGB 12.3* 12.5*  HCT 36.7* 38.0*  MCV 92.9 93.6  PLT 418* 415*   Cardiac Enzymes:  Recent Labs Lab 06/19/16 2044 06/20/16 0313  TROPONINI <0.03 <0.03   BNP: Invalid input(s): POCBNP CBG: No results for input(s): GLUCAP in the last 168 hours.  Significant Diagnostic Studies:  Ct Abdomen Pelvis W Contrast  06/10/2016  CLINICAL DATA:  Perirectal pain with focal wound EXAM: CT ABDOMEN AND PELVIS WITH CONTRAST TECHNIQUE: Multidetector CT imaging of the abdomen and pelvis was performed using the standard protocol following bolus administration of intravenous contrast. CONTRAST:  100 mL ISOVUE-300 IOPAMIDOL (ISOVUE-300) INJECTION 61% COMPARISON:  None. FINDINGS: Lower chest:  No acute findings. Hepatobiliary: The liver is mildly fatty infiltrated. The gallbladder is within normal limits. Pancreas: No mass, inflammatory changes, or other significant abnormality. Spleen: Within normal limits in size and appearance. Adrenals/Urinary Tract: No masses identified. No evidence of hydronephrosis. Small right renal cyst is seen. The bladder is well distended. Stomach/Bowel: No evidence of obstruction, inflammatory process, or abnormal fluid collections. The appendix is within normal limits.  Mild diverticular change is noted without evidence of diverticulitis. Vascular/Lymphatic: No pathologically enlarged lymph nodes. No evidence of abdominal aortic aneurysm. Reproductive: No mass or other significant abnormality. Other: Fat containing inguinal hernia is seen. In the right gluteal region medially of there is some inflammatory change and a small amount of subcutaneous air likely representing inflammation. Additionally more superiorly adjacent to the prostate and rectum there are areas of air and fluid identified. The largest of these lies just lateral to the prostate and measures 3.4 x 2.4 x 14.7 cm in greatest AP, transverse and craniocaudad projections respectively. This is best visualized on image number 99 of series 2 as well as images 111 of series 5 and 97 of series 6. These are consistent with small abscesses Musculoskeletal:  No suspicious bone lesions identified. IMPRESSION: Air-fluid collections adjacent to the prostate laterally on the right consistent with abscesses. Smaller collection is noted somewhat more  posteriorly adjacent to the distal rectum. Additionally some inflammatory changes in the right gluteal region medially are seen. Electronically Signed   By: Alcide Clever M.D.   On: 06/10/2016 18:21   Dg Chest Port 1 View  06/10/2016  CLINICAL DATA:  Possible sepsis EXAM: PORTABLE CHEST 1 VIEW COMPARISON:  None. FINDINGS: The heart size is enlarged. There is no focal infiltrate, pulmonary edema, or pleural effusion. The lung volumes are low. Mediastinal contour is unremarkable given the AP semi-erect technique. The visualized skeletal structures are unremarkable. IMPRESSION: Cardiomegaly.  No focal pneumonia. Electronically Signed   By: Sherian Rein M.D.   On: 06/10/2016 16:47     Procedures    Left Heart Cath and Coronary Angiography    Conclusion     There is moderate to severe left ventricular systolic dysfunction.  No signficant CAD.  Mildly elevated  LVEDP.  Nonischemic cardiomyopathy. Continue medical therapy. I also recommended weight loss to him.    2-D echo ------------------------------------------------------------------- Study Conclusions  - Left ventricle: The cavity size was mildly dilated. Wall  thickness was increased in a pattern of mild LVH. Systolic  function was severely reduced. The estimated ejection fraction  was in the range of 25% to 30%. Diffuse hypokinesis. Doppler  parameters are consistent with abnormal left ventricular  relaxation (grade 1 diastolic dysfunction). - Mitral valve: Calcified annulus. - Left atrium: The atrium was mildly dilated.  Impressions:  - Severe global reduction in LV function; mild LVH; mild LVE; grade  1 diastolic dysfunction; mild LAE.  Disposition and Follow-up: Discharge Instructions    (HEART FAILURE PATIENTS) Call MD:  Anytime you have any of the following symptoms: 1) 3 pound weight gain in 24 hours or 5 pounds in 1 week 2) shortness of breath, with or without a dry hacking cough 3) swelling in the hands, feet or stomach 4) if you have to sleep on extra pillows at night in order to breathe.    Complete by:  As directed   Diet - low sodium heart healthy    Complete by:  As directed   Discharge instructions    Complete by:  As directed   Please take Xarelto  twice a day for 20 more days. On 07/12/16, please change to Xarelto  daily with supper. Please don't stop Xarelto until advised to stop by your doctor.  Dressing changes twice a day, shower with soap and water.   Increase activity slowly    Complete by:  As directed       DISPOSITION: Home   DISCHARGE FOLLOW-UP Follow-up Information    TSUEI,MATTHEW K., MD. Schedule an appointment as soon as possible for a visit in 1 week(s).   Specialty:  General Surgery Why:  For post-operative follow up Contact information: [address] [place] [address] [phone]        [facility] Follow up on 07/29/2016.   Why:  For sleep Study @ 8:00 pm. Office will give you a call as well.  Contact information: [address] [place] [address] [phone]       [facility], [place]. Go on 06/25/2016.   Specialty:  General Surgery Why:  For wound re-check. appointment is at 11:30 AM. please arrive 30 minutes early.  Contact information: [address] [place] [address] [phone]       [facility] Follow up on 06/26/2016.  Why:  Hospital follow-up appointment on 06/26/16 at 9:30 am with Dr. Venetia Night. Contact information: [address] [place] [address] [place] [address] [phone]       Dietrich Pates, MD. Schedule an appointment as soon as possible for a visit in 10 day(s).   Specialty:  Cardiology Why:  for hospital follow-up Contact information: [address] [place] [address] [phone]        [facility].   Why:  CPAP and home oxygen Contact information: [address] [place] [address] [phone]            Time spent on Discharge: 45 minutes  Signed:   RAI,RIPUDEEP M.D. [facility] 06/22/2016, 10:58 AM Pager: [phone]

## 2016-06-23 MED FILL — XARELTO 15 MG TABLET: 15 | 9 days supply | Qty: 18 | Fill #1

## 2016-06-26 ENCOUNTER — Encounter: Payer: Self-pay | Admitting: Family Medicine

## 2016-06-26 ENCOUNTER — Ambulatory Visit: Payer: Self-pay | Attending: Family Medicine | Admitting: Family Medicine

## 2016-06-26 VITALS — BP 88/60 | HR 71 | Temp 97.2°F | Ht 66.0 in | Wt 257.8 lb

## 2016-06-26 DIAGNOSIS — I10 Essential (primary) hypertension: Secondary | ICD-10-CM

## 2016-06-26 DIAGNOSIS — L0231 Cutaneous abscess of buttock: Secondary | ICD-10-CM

## 2016-06-26 DIAGNOSIS — I2699 Other pulmonary embolism without acute cor pulmonale: Secondary | ICD-10-CM

## 2016-06-26 DIAGNOSIS — I5023 Acute on chronic systolic (congestive) heart failure: Secondary | ICD-10-CM

## 2016-06-26 DIAGNOSIS — Z9889 Other specified postprocedural states: Secondary | ICD-10-CM | POA: Insufficient documentation

## 2016-06-26 DIAGNOSIS — Z86711 Personal history of pulmonary embolism: Secondary | ICD-10-CM | POA: Insufficient documentation

## 2016-06-26 DIAGNOSIS — E662 Morbid (severe) obesity with alveolar hypoventilation: Secondary | ICD-10-CM

## 2016-06-26 DIAGNOSIS — Z6841 Body Mass Index (BMI) 40.0 and over, adult: Secondary | ICD-10-CM | POA: Insufficient documentation

## 2016-06-26 DIAGNOSIS — I5022 Chronic systolic (congestive) heart failure: Secondary | ICD-10-CM | POA: Insufficient documentation

## 2016-06-26 DIAGNOSIS — Z79899 Other long term (current) drug therapy: Secondary | ICD-10-CM | POA: Insufficient documentation

## 2016-06-26 DIAGNOSIS — R0602 Shortness of breath: Secondary | ICD-10-CM | POA: Insufficient documentation

## 2016-06-26 HISTORY — DX: Other pulmonary embolism without acute cor pulmonale: I26.99

## 2016-06-26 MED ORDER — RIVAROXABAN 20 MG PO TABS: 20.0000 mg | ORAL_TABLET | Freq: Every day | ORAL | 1 refills | Status: DC

## 2016-06-26 MED ORDER — CARVEDILOL 3.125 MG PO TABS: 3.1250 mg | ORAL_TABLET | Freq: Two times a day (BID) | ORAL | 3 refills | Status: DC

## 2016-06-26 NOTE — Patient Instructions (Addendum)
Embolia pulmonar (Pulmonary Embolism) La embolia pulmonar (EP) es la obstruccin repentina o la disminucin de la irrigacin de sangre a uno o ambos pulmones. La mayora de las obstrucciones se produce a causa de un cogulo de sangre que se desplaza desde las piernas o la pelvis Lub[employer]ol Corporation pulmones. La EP es una afeccin peligrosa y potencialmente mortal si no se trata de inmediato. CAUSAS La embolia pulmonar ocurre con mayor frecuencia cuando un cogulo de sangre se desplaza desde una de las venas The First American. En contadas ocasiones, se debe al desplazamiento de aire, grasa, lquido amnitico o una porcin de un tumor a travs de las venas [employer] First American. FACTORES DE RIESGO Es ms probable que la EP se manifieste en los siguientes casos:  Los fumadores.  Las personas de edad avanzada, Haematologist las [place] de [place].  Las personas que tienen sobrepeso (obesidad).  Las Eli Lilly and Company permanecen sentadas o acostadas durante mucho tiempo, por ejemplo, durante un viaje de larga distancia (de ms de 4horas), mientras hacen reposo en cama, durante una hospitalizacin o [place] se recuperan de determinadas afecciones, como un ictus.  Las personas que no hacen mucha actividad fsica (estilo de vida sedentario).  Las personas que sufren trastornos respiratorios crnicos.  Las personas que tienen antecedentes personales o familiares de cogulos de [place] o enfermedades relacionadas con la coagulacin sangunea.  Las personas que tienen enfermedades vasculares perifricas (EVP), diabetes o algunos tipos de cncer.  Las personas que sufren enfermedades cardacas, en especial si recientemente tuvieron un infarto de miocardio o padecen insuficiencia cardaca congestiva.  Las personas que tienen enfermedades neurolgicas que afectan las piernas (paresia de las piernas).  Las personas que sufrieron una lesin traumtica, como una fractura en la cadera o la pierna.  Las que recientemente  se sometieron a Chief Technology Officer mayor o prolongada, en especial de cadera, rodilla o abdomen.  Las personas a las que se les coloc una va central en una vena de gran tamao.  Las personas que toman medicamentos que contienen la hormona estrgeno, entre ellos, los anticonceptivos y la terapia de Economist hormonal.  El embarazo o durante el [place], o el perodo del posparto. SIGNOS Y SNTOMAS  Generalmente, los sntomas de EP comienzan repentinamente e incluyen lo siguiente:  Falta de aire mientras descansa o est en actividad.  Tos con o sin sangre, o mucosidad sanguinolenta.  Dolor de pecho que suele empeorar al respirar hondo.  Latidos cardacos rpidos o irregulares.  Sentirse aturdido o Advanced Micro Devices.  Desmayos.  Sentir ansiedad.  Sudoracin. Tambin puede haber dolor e hinchazn en una pierna si all se form el cogulo sanguneo. Estos sntomas pueden representar un problema grave que constituye Radio broadcast assistant. No espere hasta que los sntomas desaparezcan. Solicite atencin mdica de inmediato. Comunquese con el servicio de emergencias de su localidad (911 en los [place]). No conduzca por sus propios medios OfficeMax Incorporated. DIAGNSTICO El mdico le har una historia clnica y un examen fsico. Tambin pueden hacerle otros estudios, por ejemplo:  Anlisis de sangre para Best Buy propiedades de coagulacin de la sangre y los niveles de oxgeno en la [place], y Engineer, manufacturing la presencia de cogulos sanguneos.  Estudios de diagnstico por imgenes, por ejemplo, tomografas computarizadas (TC), ecografas, resonancias magnticas (RM), radiografas y otros estudios para determinar si hay cogulos en cualquier parte del cuerpo.  Un electrocardiograma (EC) para determinar si hay sobrecarga cardaca debido a los cogulos sanguneos en los pulmones. TRATAMIENTO Los principales objetivos del tratamientos de la EP son  los siguientes:  Multimedia programmer agrandamiento del cogulo  sanguneo.  Evitar la formacin de nuevos cogulos sanguneos. El tipo de tratamiento que se administra depende de muchos factores, como la causa de la EP, el riesgo de tener una hemorragia o ms cogulos, y Heritage manager enfermedades que padezca. A veces, se necesita una combinacin de tratamientos. El tratamiento de esta afeccin puede incluir lo siguiente:  Medicamentos, entre ellos, anticoagulantes ms nuevos por va oral, warfarina, heparinas de Tech Data Corporation, trombolticos o heparinas.  Usar medias de compresin o diferentes tipos de dispositivos.  Ciruga (en contadas ocasiones) para extraer el cogulo de Retail buyer o colocar un filtro en el abdomen para evitar que este se desplace hasta los pulmones. Los tratamientos para la EP suelen dividirse en el tratamiento inmediato, el tratamiento a largo plazo (hasta despus de la EP) y Scientist, research (medical) prolongado (ms de despus de la EP). El tratamiento puede continuar por varios meses. Esto se conoce como tratamiento de mantenimiento y se [place] para Transport planner formacin de nuevos cogulos de [place]. Puede trabajar junto con el mdico para elegir el programa de tratamiento ms adecuado para su caso. Qu son los anticoagulantes? Los anticoagulantes son medicamentos que se utilizan para tratar las Continental Airlines. Pueden impedir la formacin de nuevos cogulos de sangre y el aumento de tamao de los ya existentes. No pueden disolver los cogulos ya formados. Con el tiempo, el organismo disuelve los cogulos por s solo. Los anticoagulantes pueden administrarse por va oral, mediante inyecciones o a travs de una va intravenosa (IV). Qu son los trombolticos? Los trombolticos son medicamentos que Autoliv cogulos y se usan para Furniture conservator/restorer [place]. Conllevan un alto riesgo de hemorragia, de modo que suelen usarse solamente cuando los casos son graves o si tiene la presin arterial muy baja. INSTRUCCIONES PARA EL CUIDADO EN EL  HOGAR Si est tomando un anticoagulante ms nuevo por va oral:  Tmelo todos los [address] a la misma hora.  Entienda cules son los alimentos y los medicamentos que interactan con este frmaco.  Entienda que no es necesario realizar anlisis de sangre peridicos cuando se toma este medicamento.  Entienda los efectos secundarios de este medicamento, incluida la excesiva formacin de hematomas o las hemorragias. Consulte al mdico o al farmacutico acerca de otros efectos secundarios posibles. Si est tomando warfarina:  Entienda cmo tomar PPL Corporation y sepa cules son los alimentos que pueden incidir en el efecto que este tiene en el organismo.  Entienda que es peligroso tomar dosis demasiado altas o bajas de warfarina. El exceso de warfarina aumenta el riesgo de [place]. Dosis demasiado bajas de warfarina aumentan el riesgo de formacin de cogulos.  Siga el cronograma de anlisis de sangre del tiempo de protrombina (PT) y el ndice internacional normalizado (INR). Los resultados del PT y del INR permiten al mdico ajustar la dosis de warfarina. Es muy importante que los ARAMARK Corporation del PT y el INR se realicen con la frecuencia que el mdico le haya indicado.  Evite hacer cambios importantes en la dieta o hable con el mdico antes de cambiarla. Solicite una cita con un nutricionista matriculado para hacerle las preguntas que le surjan. Muchos alimentos, especialmente los que tienen alto contenido de vitamina K, pueden alterar el efecto de la warfarina, y [employer] Wholesale del PT y del INR. Consuma siempre una buena cantidad de [address] con alto contenido de vitaminaK, entre ellos,  espinaca, col rizada, brcoli, repollo, coles berzas, hojas de nabo, repollitos de [place],  guisantes, coliflor, algas y perejil.  Hgado de vaca y de cerdo.  T verde.  Aceite de soja.  Informe al Harrah[employer] Entertainment, las vitaminas y los suplementos que toma, entre ellos, aspirina y  otros antiinflamatorios de Sales promotion account executive. Tenga especial cuidado con la aspirina y los medicamentos antiinflamatorios. No tome esos medicamentos sin antes consultarle al mdico si es seguro hacerlo. Esto es importante porque muchos medicamentos pueden interferir en el efecto de la warfarina e incidir en los [place] del PT y del INR.  No empiece a tomar ni suspenda ningn medicamento recetado o de [address], a menos que el mdico o el farmacutico se lo indiquen. Si toma warfarina, tambin tendr Gap Inc lo siguiente:  Occupational hygienist presin sobre las heridas durante ms tiempo que lo habitual.  Informar al dentista y a otros mdicos que est tomando warfarina, antes de someterse a cualquier procedimiento en el que pudieran producirse hemorragias.  Evitar el consumo de alcohol o beber cantidades muy pequeas. Informe al mdico si modifica el consumo de alcohol.  No consuma productos que contengan tabaco, incluidos cigarrillos, tabaco de Theatre manager y Administrator, Civil Service. Si necesita ayuda para dejar de fumar, consulte al mdico.  Evite los deportes de [place]. Instrucciones generales  Baxter International de venta libre y los recetados solamente como se lo haya indicado el mdico. Los anticoagulantes pueden causar efectos secundarios, entre ellos, la tendencia a la formacin de hematomas y la dificultad para TEFL teacher las hemorragias. Si le recetaron un anticoagulante, tambin tendr Gap Inc lo siguiente:  Occupational hygienist presin sobre las heridas durante ms tiempo que lo habitual.  Informar al dentista y a otros mdicos que est tomando anticoagulantes, antes de someterse a cualquier procedimiento en el que pudieran producirse hemorragias.  Evite los deportes de contacto.  Use un brazalete de alerta mdico o lleve una tarjeta de alerta mdico que indique que ha tenido EP.  Consulte con su mdico cundo podr retomar sus Duke Energy. Mantngase activo para Transport planner formacin de nuevos  cogulos de [place].  Asegrese de Copywriter, advertising un viaje o cuando haya estado sentado o de pie durante mucho tiempo. Es Barrister's clerk. Ejercite las piernas con caminatas o contracciones y Child psychotherapist frecuentes de los msculos de las piernas. [place] con frecuencia.  Use medias de compresin como se lo haya indicado el mdico para evitar la formacin de ms cogulos de [place].  No consuma productos que contengan tabaco, incluidos cigarrillos, tabaco de Theatre manager y Administrator, Civil Service. Si necesita ayuda para dejar de fumar, consulte al mdico.  Cumpla con todas las visitas de control, segn le indique su mdico. Esto es importante. PREVENCIN Tome estas medidas para reducir el riesgo de tener otra EP:  Haga ejercicios regularmente. Durante por lo menos CarMax, [place] lo siguiente:  Actividades que incluyan movimientos de los brazos y las piernas.  Actividades que favorezcan la buena circulacin de la sangre en el cuerpo al incrementar la frecuencia cardaca.  Ejercite los brazos y las piernas cada hora durante los viajes de larga distancia (de ms de 4horas). Birdie Hopes agua y no consuma alcohol cuando viaja.  Evite permanecer sentado o parado sin moverse durante perodos prolongados.  Mantenga un peso apropiado para su altura. Pregntele al mdico cul es el peso sano para usted.  Si es mujer y es mayor de 35aos, evite el consumo innecesario de medicamentos que contengan estrgeno, incluidos los anticonceptivos.  No fume, especialmente si toma estrgenos. Si necesita ayuda para  dejar de fumar, consulte al American Express.  Si corre un riesgo muy alto de [place] EP, use medias de compresin.  Si tuvo una EP recientemente, debe programar citas para hacerse ecografas de las piernas con regularidad, a fin de Landscape architect presencia de nuevos cogulos sanguneos. Si est hospitalizado, las medidas de prevencin pueden incluir lo siguiente:  Rayford Halsted  a caminar rpidamente despus de [place], tan pronto como el mdico diga que es seguro [place].  Recibir anticoagulantes para Transport planner formacin de cogulos de [place]. Si no puede tomar anticoagulantes, puede haber otras opciones disponibles, como usar medias de compresin o diferentes tipos de dispositivos. SOLICITE ATENCIN MDICA DE INMEDIATO SI:  Aumenta el dolor, la hinchazn o el enrojecimiento en un brazo o una pierna; o estos sntomas aparecen.  Tiene adormecimiento u hormigueo en un brazo o una pierna.  Siente que le falta de aire mientras descansa o est en [place].  Siente dolor en el pecho.  Tiene latidos cardacos rpidos o irregulares.  Se siente mareado o siente que va a desvanecerse.  Tose y escupe sangre.  Observa sangre en el vmito, en las heces o en la orina.  Tiene fiebre. Estos sntomas pueden representar un problema grave que constituye Radio broadcast assistant. No espere hasta que los sntomas desaparezcan. Solicite atencin mdica de inmediato. Comunquese con el servicio de emergencias de su localidad (911 en los [place]). No conduzca por sus propios medios OfficeMax Incorporated.   Esta informacin no tiene Theme park manager el consejo del mdico. Asegrese de hacerle al mdico cualquier pregunta que tenga.   Document Released: 08/27/2005 Document Revised: 08/08/2015 Elsevier Interactive Patient Education 2016 [employer]. Plan de alimentacin cardiosaludable (Heart-Healthy Eating Plan) Muchos factores influyen en la salud cardaca, entre ellos, los hbitos de alimentacin y de ejercicio fsico. El riesgo cardaco (coronario) aumenta con los niveles anormales de grasa (lpidos) en la [place]. La planificacin de las comidas cardiosaludables incluye limitar las grasas poco saludables, aumentar las grasas saludables y Radio producer otros cambios pequeos en la dieta, por ejemplo, mantener un peso corporal saludable para ayudar a que los niveles de lpidos se mantengan  dentro de un rango normal. EN QU CONSISTE EL PLAN?  El mdico le recomienda que:  No consuma ms del __________% de las caloras totales en su ingesta diaria de grasa.  Limite su ingesta de grasas saturadas a menos del ______% de las caloras totales diarias.  Limite la cantidad de colesterol en su dieta a menos de _________mg Karie Chimera. QU TIPOS DE GRASAS DEBO ELEGIR?  Elija grasas saludables con mayor frecuencia. Elija las grasas monoinsaturadas y [address], como el aceite de oliva y canola, las semillas de [place], las nueces, las almendras y las semillas.  Consuma ms grasas omega-3. Las mejores opciones incluyen salmn, caballa, sardinas, atn, aceite de lino y semillas de lino molidas. Propngase comer pescado al B[employer] Group veces por semana.  Limite el consumo de grasas saturadas. Estas se encuentran principalmente en los productos de origen animal, como las carnes, la [place] y la crema. Las grasas saturadas de origen vegetal incluyen aceite de palma, de palmiste y de coco.  Evite los alimentos con aceites parcialmente hidrogenados. Estos contienen grasas trans. Entre los ejemplos de alimentos con grasas trans se incluyen margarinas en barra, algunas margarinas untables, galletas dulces o saladas y otros productos horneados. QU PAUTAS GENERALES DEBO SEGUIR?  Lea las etiquetas de los alimentos detenidamente para identificar los que contienen grasas trans o altas cantidades de grasas saturadas.  Llene  la mitad del plato con verduras y ensaladas de hojas verdes. Coma 4 o 5porciones de verduras Air cabin crew. Una porcin de verduras equivale a 1taza de verduras de hoja crudas, taza de verduras en trozos crudas o cocidas, o taza de jugo de verduras.  Llene un cuarto del plato con cereales integrales. Busque la palabra "integral" en Estate agent de la lista de ingredientes.  Llene un cuarto del plato con alimentos con protenas magras.  Coma 4 o 5porciones de frutas por  da. Una porcin de frutas equivale a una fruta mediana entera; taza de fruta disecada; taza de frutas frescas, congeladas o enlatadas, o taza de jugo 100% de fruta.  Consuma ms alimentos con fibra soluble, por ejemplo, manzanas, brcoli, zanahorias, frijoles, guisantes y [place]. Trate de consumir de 20a 30g de [employer].  Consuma ms comida casera y menos de restaurante, de buf y comida rpida.  Limite o evite el alcohol.  Limite los alimentos con alto contenido de almidn y International aid/development worker.  Evite las comidas fritas.  Cocine los alimentos utilizando mtodos que no sean la fritura. Las opciones de coccin ms [place] son Development worker, community, Regulatory affairs officer, Software engineer y asar a Patent attorney. Otras sugerencias para reducir las grasas incluyen lo siguiente:  Quite la piel de las aves.  Quite todas las grasas visibles de las carnes.  Espume la grasa de los guisos, las sopas y las salsas antes de servirlos.  Cocine al vapor las verduras en agua o caldo.  Baje de peso si es necesario. Perder solo del 5 al 10% de su peso inicial puede ayudarle a mejorar su estado de salud general y a Education officer, museum, como la diabetes y las enfermedades cardacas.  Aumente el consumo de frutos secos, legumbres y semillas a 4 o 5porciones por semana. Una porcin de frijoles o legumbres secos equivale a taza despus de su coccin, una porcin de frutos secos equivale a 1onzas y [place] porcin de semillas equivale a onza o 1cucharada.  Es posible que deba controlar la ingesta de sal (sodio), especialmente si tiene hipertensin arterial. Hable con el mdico o el nutricionista para obtener ms informacin sobre cmo Recruitment consultant. QU ALIMENTOS PUEDO COMER? Cereales Panes, incluido el pan francs, blanco, pita, de [place], de pasas de [place], de centeno, de avena e [place]. Tortillas que no estn fritas ni elaboradas con manteca de cerdo ni grasas trans. Panecillos bajos en grasas, incluidos los panes para perros  calientes y [place], y los bollitos tipo ingls. Galletas. Muffins. Waffles. Panqueques. Palomitas de maz con bajo contenido calrico. Cereales integrales. Pan sin levadura. Tostada Melba. Pretzels. Palitos de pan. Galletas. Galletitas y Product manager en grasas, entre ellas, las que tienen forma de [place], las [place], el pan cimo, las [place], las que tienen forma de [place] y las de centeno. Arroz y pastas, incluido el arroz integral y las pastas elaboradas con cereales integrales. Verduras Todas las verduras. Frutas Todas las frutas, pero limite el [place]. [place] y [place] fuentes de protenas Carne de res, [place], cerdo y cordero magras sin grasa. Pollo y pavo sin piel. Todos los pescados y Liberty Global. Pato salvaje, conejo, faisn y venado. Claras de huevo o sustitutos del huevo bajos en colesterol. Porotos, guisantes, lentejas secos y tofu.Semillas y la mayora de los frutos secos. Lcteos Quesos descremados y semidescremados, entre ellos, ricota, queso en hebras y Garment/textile technologist. Leche descremada o al 1% que sea lquida, en polvo o evaporada. Suero de [employer] Resources elaborado con Molson [employer]ors Brewing. Yogur descremado o bajo en  grasas. Bebidas Agua mineral. Bebidas gaseosas dietticas. Dulces y postres Sorbetes y helados de fruta. [place], [place], [place], jalea y [place]. Merengues y gelatinas. Caramelos de Science Applications International, como caramelos duros, caramelos de goma, pastillas de goma, mentas, malvaviscos y pequeas cantidades de chocolate amargo. Torta ngel. Coma todos los dulces y postres con moderacin. Grasas y Writer no hidrogenadas (sin grasas trans). Aceites vegetales, incluido el de soja, ssamo, girasol, [place], man, crtamo, maz, canola y semillas de algodn. Alios para ensalada o mayonesa elaborados con aceite vegetal. Limite las grasas y los aceites agregados que [place] para Water quality scientist, Development worker, community, preparar ensaladas y las cremas untables. Otros [place] en polvo. T o caf. Todos  los alios y condimentos. Los artculos mencionados arriba pueden no ser Raytheon de las bebidas o los alimentos recomendados. Comunquese con el nutricionista para conocer ms opciones. QU ALIMENTOS NO SE RECOMIENDAN? Cereales Panes elaborados con grasas saturadas o trans, aceites o Eastman Kodak. Croissants. Panecillos de mantequilla. Panes de queso. Panecillos dulces. Rosquillas. Palomitas de maz con mantequilla. Fideos chow mein. Galletitas con FedEx de grasas, como las que contienen queso o [place]. Carnes y [address] fuentes de protenas Carnes grasas, como perros calientes, [place] de res, [address], puntas de [employer], [place], asado de [place] o [place], y carnero. Fiambres altos en grasas, como salame y [place]. Caviar. Pato y ganso domsticos. Vsceras, como riones, hgado, [place], sesos, [place] de ave, chinchulines y Programmer, multimedia. Lcteos Crema, crema agria, queso crema y [place] cottage con crema. Quesos elaborados con Eastman Kodak, incluido el queso [place] (bleu), [place], [place], [place], [place], [place], suizo, cheddar, camembert y [place]. Leche entera o al 2% que sea [place], evaporada o condensada. Suero de Liberty Global. Salsa de crema o queso alta en grasas. Yogur elaborado con Eastman Kodak. Bebidas Refrescos regulares y bebidas con agregado de azcar. Dulces y [employer] Supply. Pudin. Galletas. Tortas que no sean la torta ngel. Caramelos que contengan chocolate con leche o chocolate blanco, grasa hidrogenada, mantequilla, coco o ingredientes desconocidos. Almbares con mantequilla. Helados o bebidas elaboradas con helado con alto contenido de grasas. Grasas y [address] que [place], [place] (the territory South of 60 deg S) de carne o materia grasa. Manteca de cacao, aceites hidrogenados, aceite de palma, aceite de coco, aceite de palmiste. A menudo, estos se encuentran en los productos horneados, los caramelos, las comidas fritas, las cremas no lcteas y las coberturas  batidas. Grasas y [address] grasas slidas, incluida la grasa del tocino, el cerdo [place], la [place] de cerdo y Civil engineer, contracting. Sustitutos de crema no lctea, como cremas para caf y sustitutos de crema agria. Alios para ensaladas elaborados con aceites desconocidos, queso o crema agria. Los artculos mencionados arriba pueden no ser Raytheon de las bebidas y los alimentos que se Theatre stage manager. Comunquese con el nutricionista para obtener ms informacin.   Esta informacin no tiene Theme park manager el consejo del mdico. Asegrese de hacerle al mdico cualquier pregunta que tenga.   Document Released: 09/14/2009 Document Revised: 12/08/2014 Elsevier Interactive Patient Education [employer]

## 2016-06-26 NOTE — Progress Notes (Signed)
Subjective:  Patient ID: Justin Sampson, male    DOB: [birth year]  Age: 60 y.o. MRN: [id number]  CC: Hospitalization Follow-up; Hypertension; and Shortness of Breath   HPI Ryszard Socarras is a 60 year old male with a history of obesity, hypertension who was hospitalized at [facility] from 06/10/16 through 06/22/16 after he had presented with non healing sores on his bottom and was hypotensive, hypoxic on presentation.  CT pelvis showed gluteal abscess tracking towards the prostate with soft-tissue gas. He was taken to the OR by Dr Corliss Skains with drainage/debridement of a large abscess after which he was placed on IV antibiotics.    During his hospitalization he was diagnosed with acute right pulmonary embolism, commenced on anticoagulation; 2-D echo revealed new diagnosis of CHF with EF of 25-30% with diffuse hypokinesis, grade 1 diastolic dysfunction. He was commenced on 3 L oxygen, set up with [facility] nursing services, cardiology follow-up and outpatient sleep study scheduled and his medications were optimized. Discharged on Xarelto starter pack and Cipro for his abscess.  Today he informs me he is doing well; had a visit to [facility] yesterday where he had his gluteal abscess evaluated and has an advanced home care RN visit later today.     Past Medical History:  Diagnosis Date  . Cardiomyopathy, nonischemic (HCC) 06/17/2016  . Hypertension   . Obesities, morbid (HCC)   . Snoring     Past Surgical History:  Procedure Laterality Date  . CARDIAC CATHETERIZATION N/A 06/16/2016   Procedure: Left Heart Cath and Coronary Angiography;  Surgeon: Corky Crafts, MD;  Location: [facility] INVASIVE CV LAB;  Service: Cardiovascular;  Laterality: N/A;  . INCISION AND DRAINAGE ABSCESS Right 06/10/2016   Procedure: INCISION AND DRAINAGE GLUTEAL ABSCESS;  Surgeon: Manus Rudd, MD;  Location: [facility] OR;  Service: General;  Laterality: Right;    No Known Allergies   Outpatient  Medications Prior to Visit  Medication Sig Dispense Refill  . furosemide (LASIX) 80 MG tablet Take 1 tablet (80 mg total) by mouth 2 (two) times daily. 60 tablet 3  . lisinopril (PRINIVIL,ZESTRIL) 5 MG tablet Take 1 tablet (5 mg total) by mouth daily. 30 tablet 1  . naproxen (NAPROSYN) 500 MG tablet Take 500 mg by mouth daily as needed.    . potassium chloride SA (K-DUR,KLOR-CON) 20 MEQ tablet Take 1 tablet (20 mEq total) by mouth 2 (two) times daily. 60 tablet 3  . Rivaroxaban (XARELTO) 15 MG TABS tablet Take 1 tablet (15 mg total) by mouth 2 (two) times daily with a meal. Start on 06/20/16 60 tablet 1  . carvedilol (COREG) 6.25 MG tablet Take 1 tablet (6.25 mg total) by mouth 2 (two) times daily with a meal. 30 tablet 1  . ciprofloxacin (CIPRO) 500 MG tablet Take 1 tablet (500 mg total) by mouth 2 (two) times daily. X 7 days (Patient not taking: Reported on 06/26/2016) 14 tablet 0  . [START ON 07/12/2016] rivaroxaban (XARELTO) 20 MG TABS tablet Take 1 tablet (20 mg total) by mouth daily with breakfast. Start taking on 07/12/16 (Patient not taking: Reported on 06/26/2016) 30 tablet 1   No facility-administered medications prior to visit.     ROS Review of Systems  Constitutional: Negative for activity change and appetite change.  HENT: Negative for sinus pressure and sore throat.   Eyes: Negative for visual disturbance.  Respiratory: Positive for shortness of breath. Negative for cough and chest tightness.   Cardiovascular: Negative for chest pain and leg  swelling.  Gastrointestinal: Negative for abdominal distention, abdominal pain, constipation and diarrhea.  Endocrine: Negative.   Genitourinary: Negative for dysuria.  Musculoskeletal: Negative for joint swelling and myalgias.  Skin:       See hpi  Allergic/Immunologic: Negative.   Neurological: Negative for weakness, light-headedness and numbness.  Psychiatric/Behavioral: Negative for dysphoric mood and suicidal ideas.    Objective:    BP (!) 88/60 (BP Location: Left Arm, Patient Position: Sitting, Cuff Size: Large)   Pulse 71   Temp 97.2 F (36.2 C)   Ht 5\' 6"  (1.676 m)   Wt 257 lb 12.8 oz (116.9 kg)   SpO2 96%   BMI 41.61 kg/m   BP/Weight 06/26/2016 06/22/2016 06/10/2016  Systolic BP 88 117 -  Diastolic BP 60 68 -  Wt. (Lbs) 257.8 264 -  BMI 41.61 - 42.61      Physical Exam  Constitutional: He is oriented to person, place, and time. He appears well-developed and well-nourished.  Obese  HENT:  On 3 L of oxygen via nasal cannula  Cardiovascular: Normal rate, normal heart sounds and intact distal pulses.   No murmur heard. Pulmonary/Chest: Effort normal and breath sounds normal. He has no wheezes. He has no rales. He exhibits no tenderness.  Abdominal: Soft. Bowel sounds are normal. He exhibits no distension and no mass. There is no tenderness.  Musculoskeletal: Normal range of motion.  Neurological: He is alert and oriented to person, place, and time.  Skin:  Right gluteal abscess carvity  Psychiatric: He has a normal mood and affect.     Assessment & Plan:   1. Gluteal abscess Dressing change performed in the clinic Closely followed by [facility] RN to see patient later today  2. Essential hypertension BPs on the soft side Reduced dose of carvedilol  3. Acute on chronic systolic CHF (congestive heart failure) (HCC) No evidence of acute failure - carvedilol (COREG) 3.125 MG tablet; Take 1 tablet (3.125 mg total) by mouth 2 (two) times daily with a meal.  Dispense: 60 tablet; Refill: 3  4. Obesity hypoventilation syndrome (HCC) Possible underlying sleep apnea Scheduled for sleep study next month  5. Other acute pulmonary embolism without acute cor pulmonale (HCC) Will need to be anticoagulated until 12/20/16;  prescription for Xarelto sent to pharmacy in house to assist with patient assistance program - rivaroxaban (XARELTO) 20 MG TABS tablet; Take 1 tablet (20  mg total) by mouth daily with breakfast. Start taking on 07/12/16  Dispense: 90 tablet; Refill: 1  Scored  16/30 on PHQ 9- will address depression at next visit   Meds ordered this encounter  Medications  . carvedilol (COREG) 3.125 MG tablet    Sig: Take 1 tablet (3.125 mg total) by mouth 2 (two) times daily with a meal.    Dispense:  60 tablet    Refill:  3    Discontinue previous dose  . rivaroxaban (XARELTO) 20 MG TABS tablet    Sig: Take 1 tablet (20 mg total) by mouth daily with breakfast. Start taking on 07/12/16    Dispense:  90 tablet    Refill:  1    Follow-up: Return in about 3 weeks (around 07/17/2016) for Follow-up on pulmonary embolism.   Jaclyn Shaggy MD

## 2016-06-26 NOTE — Progress Notes (Signed)
Patient has no idea what medications he is taking- he is quessing

## 2016-06-29 ENCOUNTER — Other Ambulatory Visit: Payer: Self-pay | Admitting: Cardiology

## 2016-06-29 ENCOUNTER — Telehealth: Payer: Self-pay | Admitting: Cardiology

## 2016-06-30 ENCOUNTER — Encounter (HOSPITAL_COMMUNITY): Payer: Self-pay | Admitting: Emergency Medicine

## 2016-06-30 ENCOUNTER — Encounter: Payer: Self-pay | Admitting: Internal Medicine

## 2016-06-30 ENCOUNTER — Emergency Department (HOSPITAL_COMMUNITY): Payer: Self-pay

## 2016-06-30 ENCOUNTER — Observation Stay (HOSPITAL_COMMUNITY): Payer: Self-pay

## 2016-06-30 ENCOUNTER — Inpatient Hospital Stay (HOSPITAL_COMMUNITY)
Admission: EM | Admit: 2016-06-30 | Discharge: 2016-07-04 | DRG: 683 | Disposition: A | Discharge status: Home / Self Care | Payer: Self-pay | Attending: Internal Medicine | Admitting: Internal Medicine

## 2016-06-30 DIAGNOSIS — I429 Cardiomyopathy, unspecified: Secondary | ICD-10-CM | POA: Diagnosis present

## 2016-06-30 DIAGNOSIS — L0231 Cutaneous abscess of buttock: Secondary | ICD-10-CM | POA: Diagnosis present

## 2016-06-30 DIAGNOSIS — G4733 Obstructive sleep apnea (adult) (pediatric): Secondary | ICD-10-CM | POA: Diagnosis present

## 2016-06-30 DIAGNOSIS — Z86711 Personal history of pulmonary embolism: Secondary | ICD-10-CM

## 2016-06-30 DIAGNOSIS — I428 Other cardiomyopathies: Secondary | ICD-10-CM

## 2016-06-30 DIAGNOSIS — T39395A Adverse effect of other nonsteroidal anti-inflammatory drugs [NSAID], initial encounter: Secondary | ICD-10-CM | POA: Diagnosis present

## 2016-06-30 DIAGNOSIS — E861 Hypovolemia: Secondary | ICD-10-CM | POA: Diagnosis present

## 2016-06-30 DIAGNOSIS — Z6841 Body Mass Index (BMI) 40.0 and over, adult: Secondary | ICD-10-CM

## 2016-06-30 DIAGNOSIS — I9589 Other hypotension: Secondary | ICD-10-CM

## 2016-06-30 DIAGNOSIS — N179 Acute kidney failure, unspecified: Principal | ICD-10-CM | POA: Diagnosis present

## 2016-06-30 DIAGNOSIS — Z87448 Personal history of other diseases of urinary system: Secondary | ICD-10-CM

## 2016-06-30 DIAGNOSIS — I110 Hypertensive heart disease with heart failure: Secondary | ICD-10-CM | POA: Diagnosis present

## 2016-06-30 DIAGNOSIS — I2699 Other pulmonary embolism without acute cor pulmonale: Secondary | ICD-10-CM

## 2016-06-30 DIAGNOSIS — N19 Unspecified kidney failure: Secondary | ICD-10-CM

## 2016-06-30 DIAGNOSIS — I959 Hypotension, unspecified: Secondary | ICD-10-CM | POA: Diagnosis present

## 2016-06-30 DIAGNOSIS — I5020 Unspecified systolic (congestive) heart failure: Secondary | ICD-10-CM | POA: Diagnosis present

## 2016-06-30 DIAGNOSIS — Z7901 Long term (current) use of anticoagulants: Secondary | ICD-10-CM

## 2016-06-30 DIAGNOSIS — J9610 Chronic respiratory failure, unspecified whether with hypoxia or hypercapnia: Secondary | ICD-10-CM | POA: Diagnosis present

## 2016-06-30 DIAGNOSIS — Z833 Family history of diabetes mellitus: Secondary | ICD-10-CM

## 2016-06-30 DIAGNOSIS — T501X5A Adverse effect of loop [high-ceiling] diuretics, initial encounter: Secondary | ICD-10-CM | POA: Diagnosis present

## 2016-06-30 DIAGNOSIS — T445X5A Adverse effect of predominantly beta-adrenoreceptor agonists, initial encounter: Secondary | ICD-10-CM | POA: Diagnosis present

## 2016-06-30 HISTORY — DX: Personal history of other diseases of urinary system: Z87.448

## 2016-06-30 LAB — CBC
HCT: 39.8 % (ref 39.0–52.0)
HEMOGLOBIN: 13.2 g/dL (ref 13.0–17.0)
MCH: 31.1 pg (ref 26.0–34.0)
MCHC: 33.2 g/dL (ref 30.0–36.0)
MCV: 93.6 fL (ref 78.0–100.0)
PLATELETS: 329 10*3/uL (ref 150–400)
RBC: 4.25 MIL/uL (ref 4.22–5.81)
RDW: 14.7 % (ref 11.5–15.5)
WBC: 8.7 10*3/uL (ref 4.0–10.5)

## 2016-06-30 LAB — BASIC METABOLIC PANEL
ANION GAP: 11 (ref 5–15)
BUN: 37 mg/dL — ABNORMAL HIGH (ref 6–20)
CALCIUM: 9.4 mg/dL (ref 8.9–10.3)
CO2: 21 mmol/L — ABNORMAL LOW (ref 22–32)
CREATININE: 7.16 mg/dL — AB (ref 0.61–1.24)
Chloride: 100 mmol/L — ABNORMAL LOW (ref 101–111)
GFR, EST AFRICAN AMERICAN: 9 mL/min — AB (ref >60)
GFR, EST NON AFRICAN AMERICAN: 7 mL/min — AB (ref >60)
GLUCOSE: 105 mg/dL — AB (ref 65–99)
Potassium: 4.7 mmol/L (ref 3.5–5.1)
Sodium: 132 mmol/L — ABNORMAL LOW (ref 135–145)

## 2016-06-30 LAB — APTT: aPTT: 44 s — ABNORMAL HIGH (ref 24–36)

## 2016-06-30 LAB — BRAIN NATRIURETIC PEPTIDE: B NATRIURETIC PEPTIDE 5: 21.4 pg/mL (ref 0.0–100.0)

## 2016-06-30 LAB — HEPARIN LEVEL (UNFRACTIONATED): HEPARIN UNFRACTIONATED: 1.28 [IU]/mL — AB (ref 0.30–0.70)

## 2016-06-30 MED ORDER — ONDANSETRON HCL 4 MG/2ML IJ SOLN: 4.0000 mg | Freq: Four times a day (QID) | INTRAMUSCULAR | Status: DC | PRN

## 2016-06-30 MED ORDER — SODIUM CHLORIDE 0.9 % IV BOLUS (SEPSIS)
1000.0000 mL | Freq: Once | INTRAVENOUS | Status: AC
  Administered 2016-06-30: 1000 mL via INTRAVENOUS

## 2016-06-30 MED ORDER — HYDRALAZINE HCL 20 MG/ML IJ SOLN: 5.0000 mg | INTRAMUSCULAR | Status: DC | PRN

## 2016-06-30 MED ORDER — SODIUM CHLORIDE 0.9% FLUSH
3.0000 mL | Freq: Two times a day (BID) | INTRAVENOUS | Status: DC
  Administered 2016-06-30 – 2016-07-02 (×3): 3 mL via INTRAVENOUS

## 2016-06-30 MED ORDER — ACETAMINOPHEN 650 MG RE SUPP
650.0000 mg | Freq: Four times a day (QID) | RECTAL | Status: DC | PRN
Start: 2016-06-30 — End: 2016-07-04

## 2016-06-30 MED ORDER — ACETAMINOPHEN 325 MG PO TABS: 650.0000 mg | ORAL_TABLET | Freq: Four times a day (QID) | ORAL | Status: DC | PRN

## 2016-06-30 MED ORDER — SODIUM CHLORIDE 0.9 % IV SOLN
INTRAVENOUS | Status: DC
  Administered 2016-06-30 – 2016-07-03 (×4): via INTRAVENOUS

## 2016-06-30 MED ORDER — ONDANSETRON HCL 4 MG PO TABS: 4.0000 mg | ORAL_TABLET | Freq: Four times a day (QID) | ORAL | Status: DC | PRN

## 2016-06-30 MED ORDER — HEPARIN (PORCINE) IN NACL 100-0.45 UNIT/ML-% IJ SOLN
1750.0000 [IU]/h | INTRAMUSCULAR | Status: DC
  Administered 2016-06-30: 1050 [IU]/h via INTRAVENOUS
  Administered 2016-07-02: 1500 [IU]/h via INTRAVENOUS
  Filled 2016-06-30 (×3): qty 250

## 2016-06-30 NOTE — Care Management Important Message (Signed)
Important Message  Patient Details  Name: Justin Sampson MRN: [id number] Date of Birth: 17-Jan-1956   Medicare Important Message Given:  Yes    [name] 06/30/2016, 3:39 PM

## 2016-06-30 NOTE — Telephone Encounter (Signed)
Cr is signif higher than a few days ago  1.6  Now 6.4 Pt needs to get seen in cardiology or by primary MD Monday for repeat labs, BP check

## 2016-06-30 NOTE — Progress Notes (Addendum)
New Admission Note: transfer from ED  Arrival Method: Stretcher Mental Orientation: a/o x4, Spanish speaking Telemetry: placed Assessment: Completed Skin:clean dry intact. patient has a wound on his bottom with packing. Patient had this wound prior to admission (about 2 wks ago) and family member stated they have been changing the dressing daily. IV:LW NS 100 Pain:none Tubes:none Safety Measures: Safety Fall Prevention Plan has been given, discussed and signed Admission: Completed Unit Orientation: Patient has been orientated to the room, unit and staff.  Family:family at bedside  Orders have been reviewed and implemented. Will continue to monitor the patient. Call light has been placed within reach and bed alarm has been activated.   Janeann Forehand BSN, RN

## 2016-06-30 NOTE — ED Provider Notes (Signed)
[facility]-EMERGENCY DEPT Provider Note   CSN: [id number] Arrival date & time: 06/30/16  4098  First Provider Contact:  None       History   Chief Complaint Chief Complaint  Patient presents with  . abnormal lab work    HPI Justin Sampson is a 60 y.o. male.  Pt was recently in the hospital for a complex case of sepsis, abscess requiring surgical debridement and PE.  Pt had outpatient evaluation by homecare.  He had lab tests drawn.  He was called to come into the ED because of abnormal lab tests.  Pt does not know the results.  Pt states he feels fine right now.  No Chest pain.  No shortness of breath.  No fevers.  No vomiting or diarrhea.  Still urinating fine.      Past Medical History:  Diagnosis Date  . Cardiomyopathy, nonischemic (HCC) 06/17/2016  . Hypertension   . Obesities, morbid (HCC)   . Snoring     Patient Active Problem List   Diagnosis Date Noted  . Acute pulmonary embolism (HCC) 06/26/2016  . Cardiomyopathy, nonischemic (HCC) 06/17/2016  . Acute on chronic systolic CHF (congestive heart failure) (HCC)   . Obesity hypoventilation syndrome (HCC)   . Morbid obesity due to excess calories (HCC)   . Systolic congestive heart failure (HCC)   . Sepsis, unspecified organism (HCC)   . Nonsustained ventricular tachycardia (HCC)   . Hypokalemia   . Hypomagnesemia   . Sepsis (HCC) 06/11/2016  . Gluteal abscess 06/10/2016  . Hypotension 06/10/2016  . Essential hypertension 06/10/2016  . Acute renal insufficiency 06/10/2016  . Melanotic stools 06/10/2016    Past Surgical History:  Procedure Laterality Date  . CARDIAC CATHETERIZATION N/A 06/16/2016   Procedure: Left Heart Cath and Coronary Angiography;  Surgeon: Corky Crafts, MD;  Location: [facility] INVASIVE CV LAB;  Service: Cardiovascular;  Laterality: N/A;  . INCISION AND DRAINAGE ABSCESS Right 06/10/2016   Procedure: INCISION AND DRAINAGE GLUTEAL ABSCESS;  Surgeon: Manus Rudd, MD;  Location: [facility] OR;  Service:  General;  Laterality: Right;       Home Medications    Prior to Admission medications   Medication Sig Start Date End Date Taking? Authorizing Provider  carvedilol (COREG) 3.125 MG tablet Take 1 tablet (3.125 mg total) by mouth 2 (two) times daily with a meal. 06/26/16  Yes Jaclyn Shaggy, MD  carvedilol (COREG) 6.25 MG tablet Take 6.25 mg by mouth 2 (two) times daily with a meal.   Yes Historical Provider, MD  cefUROXime (CEFTIN) 500 MG tablet Take 500 mg by mouth 2 (two) times daily with a meal. For 14 days, started on 06-20-16   Yes Historical Provider, MD  furosemide (LASIX) 80 MG tablet Take 1 tablet (80 mg total) by mouth 2 (two) times daily. 06/21/16  Yes Ripudeep Jenna Luo, MD  lisinopril (PRINIVIL,ZESTRIL) 5 MG tablet Take 1 tablet (5 mg total) by mouth daily. 06/20/16  Yes Ripudeep Jenna Luo, MD  naproxen (NAPROSYN) 500 MG tablet Take 500 mg by mouth daily as needed. 10/20/15  Yes Historical Provider, MD  potassium chloride SA (K-DUR,KLOR-CON) 20 MEQ tablet Take 1 tablet (20 mEq total) by mouth 2 (two) times daily. 06/21/16  Yes Ripudeep Jenna Luo, MD  Rivaroxaban (XARELTO) 15 MG TABS tablet Take 1 tablet (15 mg total) by mouth 2 (two) times daily with a meal. Start on 06/20/16 06/20/16  Yes Ripudeep Jenna Luo, MD  rivaroxaban (XARELTO) 20 MG TABS tablet Take 1 tablet (20  mg total) by mouth daily with breakfast. Start taking on 07/12/16 07/12/16   Jaclyn Shaggy, MD    Family History Family History  Problem Relation Age of Onset  . Diabetes Mother   . Cancer Neg Hx     Social History Social History  Substance Use Topics  . Smoking status: Never Smoker  . Smokeless tobacco: Never Used  . Alcohol use 0.6 - 1.2 oz/week    1 - 2 Cans of beer per week     Allergies   Review of patient's allergies indicates no known allergies.   Review of Systems Review of Systems  All other systems reviewed and are negative.    Physical Exam Updated Vital Signs BP 94/65   Pulse 66   Temp 98.1 F (36.7 C)  (Oral)   Resp 12   Wt 109.5 kg   SpO2 99%   BMI 38.98 kg/m   Physical Exam  Constitutional: He appears well-developed and well-nourished. No distress.  HENT:  Head: Normocephalic and atraumatic.  Right Ear: External ear normal.  Left Ear: External ear normal.  Eyes: Conjunctivae are normal. Right eye exhibits no discharge. Left eye exhibits no discharge. No scleral icterus.  Neck: Neck supple. No tracheal deviation present.  Cardiovascular: Normal rate, regular rhythm and intact distal pulses.   Pulmonary/Chest: Effort normal and breath sounds normal. No stridor. No respiratory distress. He has no wheezes. He has no rales.  Abdominal: Soft. Bowel sounds are normal. He exhibits no distension. There is no tenderness. There is no rebound and no guarding.  Musculoskeletal: He exhibits no edema or tenderness.  Buttock wound without induration, no exudate, no purulent drainage  Neurological: He is alert. He has normal strength. No cranial nerve deficit (no facial droop, extraocular movements intact, no slurred speech) or sensory deficit. He exhibits normal muscle tone. He displays no seizure activity. Coordination normal.  Skin: Skin is warm and dry. No rash noted.  Psychiatric: He has a normal mood and affect.  Nursing note and vitals reviewed.    ED Treatments / Results  Labs (all labs ordered are listed, but only abnormal results are displayed) Labs Reviewed  BASIC METABOLIC PANEL - Abnormal; Notable for the following:       Result Value   Sodium 132 (*)    Chloride 100 (*)    CO2 21 (*)    Glucose, Bld 105 (*)    BUN 37 (*)    Creatinine, Ser 7.16 (*)    GFR calc non Af Amer 7 (*)    GFR calc Af Amer 9 (*)    All other components within normal limits  CBC  BRAIN NATRIURETIC PEPTIDE  URINALYSIS, ROUTINE W REFLEX MICROSCOPIC (NOT AT [facility])    EKG  EKG Interpretation  Date/Time:  Monday June 30 2016 11:07:50 EDT Ventricular Rate:  66 PR Interval:  160 QRS  Duration: 88 QT Interval:  394 QTC Calculation: 413 R Axis:   15 Text Interpretation:  Normal sinus rhythm Low voltage QRS Nonspecific T wave abnormality Abnormal ECG Since last tracing rate slower Confirmed by Novelle Addair  MD-J, Lovena Kluck ([id number]) on 06/30/2016 2:19:09 PM       Radiology Dg Chest 2 View  Result Date: 06/30/2016 CLINICAL DATA:  Shortness of breath today, weakness. EXAM: CHEST  2 VIEW COMPARISON:  06/10/2016 FINDINGS: Heart is borderline in size, decreased in size since prior study. No confluent airspace opacity or effusion. No acute bony abnormality. IMPRESSION: Borderline heart size.  No active disease. Electronically  Signed   By: Charlett Nose M.D.   On: 06/30/2016 11:57   Procedures Procedures (including critical care time)  Medications Ordered in ED Medications  sodium chloride 0.9 % bolus 1,000 mL (1,000 mLs Intravenous New Bag/Given 06/30/16 1346)     Initial Impression / Assessment and Plan / ED Course  I have reviewed the triage vital signs and the nursing notes.  Pertinent labs & imaging results that were available during my care of the patient were reviewed by me and considered in my medical decision making (see chart for details).  Clinical Course  Comment By Time  Labs reviewed. NewAcute renal failure without hypokalemia.  The patient's blood pressures have been borderline. He does not have a fever and white blood cell count is not elevated. I suspect this is related to his acute renal failure. most likely related to his new medications of Lasix and an ACE inhibitor. Linwood Dibbles, MD 07/31 1407    Patient presents to the emergency room for acute renal failure. I doubt acute infection. Patient was recently started on an ACE inhibitor and Lasix. I suspect that's the cause of this acute kidney injury I have ordered gentle hydration. Hold his lasix and ace. I will consult with the medical service for admission and further evaluation.    Final Clinical Impressions(s) / ED  Diagnoses   Final diagnoses:  Acute renal failure, unspecified acute renal failure type (HCC)     Linwood Dibbles, MD 06/30/16 1420

## 2016-06-30 NOTE — Telephone Encounter (Signed)
Called patient via Spanish Interpretor, he was on his way to [facility] to ask to be seen there today.  Advised patient to go to [facility] ED for repeat labs and to be evaluated.  He does not know the name of the home care agency that is following him.  I did not see any other notes in EPIC regarding why he had labs on Sunday.  No results in EPIC or received via FAX in the office at this time.  Called Trish, cardmaster to inform patient is coming to ED.  Reviewed with Tereso Newcomer, APP, who is in agreement with plan.

## 2016-06-30 NOTE — H&P (Signed)
History and Physical    Lonzell Snawder TRV:[id number] DOB: 1956-08-31 DOA: 06/30/2016  PCP: Jaclyn Shaggy, MD Patient coming from: home  Chief Complaint: lab abnormality  HPI: Justin Sampson is a 60 y.o. male with medical history significant of  HTN, and CHF. Patient recently admitted from 06/10/2016 to 06/23/2016 for treatment of sepsis secondary to buttock abscess with associated nonischemic cardiomyopathy, and acute respiratory hypoxic failure secondary to pulmonary embolus. Since time of discharge patient states his overall physical condition continues to improve. He is near baseline. Patient states his only residual symptom is occasional nausea. Patient has not been back to see a physician since time of discharge. Patient has had home wound care with daily packing of his wound. Labs were drawn for routine one day ago. Patient was given report today that labs were grossly abnormal and he needed to come to the emergency room for emergent workup. Patient also endorses having low blood pressure 1 day ago at which time he was told to stop his carvedilol. Today blood pressure is again noted to be low and in light of patient's worsening renal function on labs he was told to hold his Lasix as well. Patient denies any fevers, cough, shortness of breath, palpitations, neck stiffness, headache, dysuria, frequency, urinary urgency or low stream velocity, or lower extremity edema or weight gain.   Patient encounter aided by patient's son who acted as interpreter  ED Course: objective findings below, started on 1L NS bolus.   Review of Systems: As per HPI otherwise 10 point review of systems negative.   Ambulatory Status: no restrictions  Past Medical History:  Diagnosis Date  . Cardiomyopathy, nonischemic (HCC) 06/17/2016  . Hypertension   . Obesities, morbid (HCC)   . Snoring     Past Surgical History:  Procedure Laterality Date  . CARDIAC CATHETERIZATION N/A 06/16/2016   Procedure: Left Heart Cath and  Coronary Angiography;  Surgeon: Corky Crafts, MD;  Location: [facility] INVASIVE CV LAB;  Service: Cardiovascular;  Laterality: N/A;  . INCISION AND DRAINAGE ABSCESS Right 06/10/2016   Procedure: INCISION AND DRAINAGE GLUTEAL ABSCESS;  Surgeon: Manus Rudd, MD;  Location: [facility] OR;  Service: General;  Laterality: Right;    Social History   Social History  . Marital status: Single    Spouse name: N/A  . Number of children: N/A  . Years of education: N/A   Occupational History  . Not on file.   Social History Main Topics  . Smoking status: Never Smoker  . Smokeless tobacco: Never Used  . Alcohol use 0.6 - 1.2 oz/week    1 - 2 Cans of beer per week  . Drug use: No  . Sexual activity: Not on file   Other Topics Concern  . Not on file   Social History Narrative  . No narrative on file    No Known Allergies  Family History  Problem Relation Age of Onset  . Diabetes Mother   . Cancer Neg Hx     Prior to Admission medications   Medication Sig Start Date End Date Taking? Authorizing Provider  carvedilol (COREG) 3.125 MG tablet Take 1 tablet (3.125 mg total) by mouth 2 (two) times daily with a meal. 06/26/16  Yes Jaclyn Shaggy, MD  carvedilol (COREG) 6.25 MG tablet Take 6.25 mg by mouth 2 (two) times daily with a meal.   Yes Historical Provider, MD  cefUROXime (CEFTIN) 500 MG tablet Take 500 mg by mouth 2 (two) times daily with a meal. For  14 days, started on 06-20-16   Yes Historical Provider, MD  furosemide (LASIX) 80 MG tablet Take 1 tablet (80 mg total) by mouth 2 (two) times daily. 06/21/16  Yes Ripudeep Jenna Luo, MD  lisinopril (PRINIVIL,ZESTRIL) 5 MG tablet Take 1 tablet (5 mg total) by mouth daily. 06/20/16  Yes Ripudeep Jenna Luo, MD  naproxen (NAPROSYN) 500 MG tablet Take 500 mg by mouth daily as needed. 10/20/15  Yes Historical Provider, MD  potassium chloride SA (K-DUR,KLOR-CON) 20 MEQ tablet Take 1 tablet (20 mEq total) by mouth 2 (two) times daily. 06/21/16  Yes Ripudeep Jenna Luo, MD    Rivaroxaban (XARELTO) 15 MG TABS tablet Take 1 tablet (15 mg total) by mouth 2 (two) times daily with a meal. Start on 06/20/16 06/20/16  Yes Ripudeep Jenna Luo, MD  rivaroxaban (XARELTO) 20 MG TABS tablet Take 1 tablet (20 mg total) by mouth daily with breakfast. Start taking on 07/12/16 07/12/16   Jaclyn Shaggy, MD    Physical Exam: Vitals:   06/30/16 1300 06/30/16 1345 06/30/16 1445 06/30/16 1532  BP: 104/66 94/65 108/69 (!) 111/53  Pulse: (!) 56 66 66 69  Resp: 21 12 (!) 9 16  Temp:    97.5 F (36.4 C)  TempSrc:    Oral  SpO2: 98% 99% 100% 100%  Weight:    116.5 kg (256 lb 14.4 oz)  Height:     (1.676 m)     General:  Appears calm and comfortable Eyes:  PERRL, EOMI, normal lids, iris ENT:  grossly normal hearing, lips & tongue, mmm Neck:  no LAD, masses or thyromegaly Cardiovascular:  RRR, no m/r/g. No LE edema.  Respiratory:  CTA bilaterally, no w/r/r. Normal respiratory effort. Abdomen:  soft, ntnd, NABS Skin:  Superior gluteal cleft wound with beefy non-necrotic tissue in the center with mild surrounding induration and no significant erythema, tenderness, and no fluctuance.  Musculoskeletal:  grossly normal tone BUE/BLE, good ROM, no bony abnormality Psychiatric:  grossly normal mood and affect, speech fluent and appropriate, AOx3 Neurologic:  CN 2-12 grossly intact, moves all extremities in coordinated fashion, sensation intact  Labs on Admission: I have personally reviewed following labs and imaging studies  CBC:  Recent Labs Lab 06/30/16 1111  WBC 8.7  HGB 13.2  HCT 39.8  MCV 93.6  PLT 329   Basic Metabolic Panel:  Recent Labs Lab 06/30/16 1111  NA 132*  K 4.7  CL 100*  CO2 21*  GLUCOSE 105*  BUN 37*  CREATININE 7.16*  CALCIUM 9.4   GFR: Estimated Creatinine Clearance: 13.3 mL/min (by C-G formula based on SCr of 7.16 mg/dL). Liver Function Tests: No results for input(s): AST, ALT, ALKPHOS, BILITOT, PROT, ALBUMIN in the last 168 hours. No results  for input(s): LIPASE, AMYLASE in the last 168 hours. No results for input(s): AMMONIA in the last 168 hours. Coagulation Profile: No results for input(s): INR, PROTIME in the last 168 hours. Cardiac Enzymes: No results for input(s): CKTOTAL, CKMB, CKMBINDEX, TROPONINI in the last 168 hours. BNP (last 3 results) No results for input(s): PROBNP in the last 8760 hours. HbA1C: No results for input(s): HGBA1C in the last 72 hours. CBG: No results for input(s): GLUCAP in the last 168 hours. Lipid Profile: No results for input(s): CHOL, HDL, LDLCALC, TRIG, CHOLHDL, LDLDIRECT in the last 72 hours. Thyroid Function Tests: No results for input(s): TSH, T4TOTAL, FREET4, T3FREE, THYROIDAB in the last 72 hours. Anemia Panel: No results for input(s): VITAMINB12, FOLATE, FERRITIN, TIBC,  IRON, RETICCTPCT in the last 72 hours. Urine analysis:    Component Value Date/Time   COLORURINE YELLOW 06/10/2016 1850   APPEARANCEUR CLEAR 06/10/2016 1850   LABSPEC 1.029 06/10/2016 1850   PHURINE 6.0 06/10/2016 1850   GLUCOSEU NEGATIVE 06/10/2016 1850   HGBUR NEGATIVE 06/10/2016 1850   BILIRUBINUR NEGATIVE 06/10/2016 1850   KETONESUR NEGATIVE 06/10/2016 1850   PROTEINUR NEGATIVE 06/10/2016 1850   NITRITE NEGATIVE 06/10/2016 1850   LEUKOCYTESUR NEGATIVE 06/10/2016 1850    Creatinine Clearance: Estimated Creatinine Clearance: 13.3 mL/min (by C-G formula based on SCr of 7.16 mg/dL).  Sepsis Labs: @LABRCNTIP (procalcitonin:4,lacticidven:4) )No results found for this or any previous visit (from the past 240 hour(s)).   Radiological Exams on Admission: Dg Chest 2 View  Result Date: 06/30/2016 CLINICAL DATA:  Shortness of breath today, weakness. EXAM: CHEST  2 VIEW COMPARISON:  06/10/2016 FINDINGS: Heart is borderline in size, decreased in size since prior study. No confluent airspace opacity or effusion. No acute bony abnormality. IMPRESSION: Borderline heart size.  No active disease. Electronically Signed    By: Charlett Nose M.D.   On: 06/30/2016 11:57    Assessment/Plan Principal Problem:   Acute renal failure (ARF) (HCC) Active Problems:   Gluteal abscess   Hypotension   Cardiomyopathy, nonischemic (HCC)   Acute pulmonary embolism (HCC)   Acute renal failure: BUN 37, Cr 7.16. Baseline 1 nad 12 respectively. Multiple possible etiologies including medication induced vs cardiorenal vs delayed cardiac cath injury (less likely), vs delayed results from shock resulting in intrarenal injury. Since discharge pt endorses compliance w/ new regimen of lasix 80 BID (previously lasix naive), lisinopril, and Naproxen use for pain. Bladder scan (225 in bladder so doubt obstructive etiology) - Tele - IVF - hold lisinopril, Lasix, naproxen - renal US - SPEP, UPEP - BMET in am - consider renal consult (Dr. Eliott Nine initially consulted by EDP who deferred formal consult until further management and workup initiated)  Systolic CHF/Cardiomyopath: Tasia Catchings catheterization performed on 06/17/2016 showing moderate to severe left ventricular dysfunction but no CAD. Echo from same admission showing EF 25% and grade 1 diastolic dysfunction. Patient placed on 80 of Lasix twice a day since that time. No evidence of acute complication. - Hold lasix as per above - hold bblocker until pressure improves - Strict I/O, Daily weights  HTN: Hypotensive at time of admission. Patient has been hypersensitive for the last day per report by home nursing staff.  - Hold Lasix, lisinopril, carvedilol. - Resume meds when pressure can support.  Buttock abscess: Status post I&D on 06/16/2016. Patient endorses significant improvement since that time. Still with open wound with granulation tissue present. Daily packing since discharge. Patient is last dose of cefuroxime on day of admission prior to coming to the ED. - Daily dressing changes.  DVT/PE: found during last admission - Hold xarelto due to renal function - Heparin per  pharmacy  DVT prophylaxis: Heparin - full dose  Code Status: full  Family Communication: son  Disposition Plan: pending improvement and workup  Consults called: none  Admission status: inpt    Cate Oravec J MD [facility]  If 7PM-7AM, please contact night-coverage [email or web address] Password [facility]  06/30/2016, 3:39 PM

## 2016-06-30 NOTE — ED Triage Notes (Addendum)
Pt was called by cardiologist to come in-- for abnormal labs -- had labs drawn yesterday at home. Had heart catheterization last week. Pt is on home O2, as needed. Pt's blood pressure has been low at home also. States EF is 25%

## 2016-06-30 NOTE — Progress Notes (Signed)
ANTICOAGULATION CONSULT NOTE - Initial Consult  Pharmacy Consult for IV Heparin Indication: h/o Recent pulmonary embolus -diagnosed 06/19/16 on last hospitalization   No Known Allergies  Patient Measurements: Height: 5\' 6"  (167.6 cm) Weight: 256 lb 14.4 oz (116.5 kg) IBW/kg (Calculated) : 63.8 Heparin Dosing Weight: 90.8 kg  Vital Signs: Temp: 97.5 F (36.4 C) (07/31 1532) Temp Source: Oral (07/31 1532) BP: 111/53 (07/31 1532) Pulse Rate: 69 (07/31 1532)  Labs:  Recent Labs  06/30/16 1111 06/30/16 1645  HGB 13.2  --   HCT 39.8  --   PLT 329  --   APTT  --  44*  HEPARINUNFRC  --  1.28*  CREATININE 7.16*  --     Estimated Creatinine Clearance: 13.3 mL/min (by C-G formula based on SCr of 7.16 mg/dL).   Medical History: Past Medical History:  Diagnosis Date  . Cardiomyopathy, nonischemic (HCC) 06/17/2016  . Hypertension   . Obesities, morbid (HCC)   . Snoring     Medications:  Prescriptions Prior to Admission  Medication Sig Dispense Refill Last Dose  . carvedilol (COREG) 3.125 MG tablet Take 1 tablet (3.125 mg total) by mouth 2 (two) times daily with a meal. 60 tablet 3 06/30/2016 at 8a  . carvedilol (COREG) 6.25 MG tablet Take 6.25 mg by mouth 2 (two) times daily with a meal.   06/29/2016 at 8a  . cefUROXime (CEFTIN) 500 MG tablet Take 500 mg by mouth 2 (two) times daily with a meal. For 14 days, started on 06-20-16   06/30/2016 at Unknown time  . furosemide (LASIX) 80 MG tablet Take 1 tablet (80 mg total) by mouth 2 (two) times daily. 60 tablet 3 06/30/2016 at Unknown time  . lisinopril (PRINIVIL,ZESTRIL) 5 MG tablet Take 1 tablet (5 mg total) by mouth daily. 30 tablet 1 06/30/2016 at Unknown time  . naproxen (NAPROSYN) 500 MG tablet Take 500 mg by mouth daily as needed.   Past Month at Unknown time  . potassium chloride SA (K-DUR,KLOR-CON) 20 MEQ tablet Take 1 tablet (20 mEq total) by mouth 2 (two) times daily. 60 tablet 3 06/30/2016 at Unknown time  . Rivaroxaban  (XARELTO) 15 MG TABS tablet Take 1 tablet (15 mg total) by mouth 2 (two) times daily with a meal. Start on 06/20/16 60 tablet 1 06/30/2016 at 8a  . [START ON 07/12/2016] rivaroxaban (XARELTO) 20 MG TABS tablet Take 1 tablet (20 mg total) by mouth daily with breakfast. Start taking on 07/12/16 90 tablet 1 not started   Scheduled:  . sodium chloride flush  3 mL Intravenous Q12H    Assessment: 60 y.o male admitted 06/30/16 due to abnormal SCr of 7.16, acute renal failure.  He was recently diagnosed with PE on 06/19/16 during last hospitalization and started on Xarelto 15mg  po BID on 06/20/16.  He last took xarelto PTA at 8am this morning.   We do not need to start IV heparin drip until ~ 22:00 tonight.  Baseline aPTT = 44 sec and Heparin level =1.28 CBC with within normal with PLTC = 329K. No bleeding reported per RN's verbal report.  We will monitor aPTT to guide heparin dosing until effect of Xarelto on heparin level diminishes.    Goal of Therapy:  Heparin level 0.3-0.7 units/ml aPTT 66-102 seconds Monitor platelets by anticoagulation protocol: Yes   Plan:  Start IV heparin drip (no bolus) at 10pm tonight at rate of 1050 units/hr (~ 12  Units/kg/hr)  Check aPTT 6 hr after start of heparin  Daily aPTT, HL and CBC.    Thank you for allowing pharmacy to be part of this patients care team. Noah Delaine, RPh Clinical Pharmacist Pager: [phone] 06/30/2016,7:19 PM

## 2016-07-01 DIAGNOSIS — I952 Hypotension due to drugs: Secondary | ICD-10-CM

## 2016-07-01 LAB — APTT
aPTT: 45 seconds — ABNORMAL HIGH (ref 24–36)
aPTT: 73 seconds — ABNORMAL HIGH (ref 24–36)

## 2016-07-01 LAB — COMPREHENSIVE METABOLIC PANEL
ALBUMIN: 2.9 g/dL — AB (ref 3.5–5.0)
ALT: 9 U/L — ABNORMAL LOW (ref 17–63)
AST: 17 U/L (ref 15–41)
Alkaline Phosphatase: 60 U/L (ref 38–126)
Anion gap: 9 (ref 5–15)
BILIRUBIN TOTAL: 0.5 mg/dL (ref 0.3–1.2)
BUN: 33 mg/dL — AB (ref 6–20)
CHLORIDE: 108 mmol/L (ref 101–111)
CO2: 20 mmol/L — ABNORMAL LOW (ref 22–32)
Calcium: 9.1 mg/dL (ref 8.9–10.3)
Creatinine, Ser: 5.26 mg/dL — ABNORMAL HIGH (ref 0.61–1.24)
GFR calc Af Amer: 13 mL/min — ABNORMAL LOW (ref >60)
GFR calc non Af Amer: 11 mL/min — ABNORMAL LOW (ref >60)
GLUCOSE: 80 mg/dL (ref 65–99)
POTASSIUM: 4.5 mmol/L (ref 3.5–5.1)
Sodium: 137 mmol/L (ref 135–145)
TOTAL PROTEIN: 6.7 g/dL (ref 6.5–8.1)

## 2016-07-01 LAB — CBC
HEMATOCRIT: 39.1 % (ref 39.0–52.0)
HEMOGLOBIN: 12.9 g/dL — AB (ref 13.0–17.0)
MCH: 30.7 pg (ref 26.0–34.0)
MCHC: 33 g/dL (ref 30.0–36.0)
MCV: 93.1 fL (ref 78.0–100.0)
Platelets: 284 10*3/uL (ref 150–400)
RBC: 4.2 MIL/uL — ABNORMAL LOW (ref 4.22–5.81)
RDW: 14.7 % (ref 11.5–15.5)
WBC: 6.5 10*3/uL (ref 4.0–10.5)

## 2016-07-01 LAB — URINE MICROSCOPIC-ADD ON

## 2016-07-01 LAB — URINALYSIS, ROUTINE W REFLEX MICROSCOPIC
Bilirubin Urine: NEGATIVE
GLUCOSE, UA: NEGATIVE mg/dL
Ketones, ur: NEGATIVE mg/dL
Leukocytes, UA: NEGATIVE
Nitrite: NEGATIVE
PH: 5.5 (ref 5.0–8.0)
Protein, ur: 100 mg/dL — AB
SPECIFIC GRAVITY, URINE: 1.01 (ref 1.005–1.030)

## 2016-07-01 LAB — HEPARIN LEVEL (UNFRACTIONATED): Heparin Unfractionated: 0.35 IU/mL (ref 0.30–0.70)

## 2016-07-01 NOTE — Consult Note (Addendum)
WOC Nurse wound consult note Reason for Consult: Consult requested for right buttock wound.  Pt had I&D performed by Dr Corliss Skains on 7/11 for an abscess, according to the EMR.   Wound type: Full thickness Pressure Ulcer POA: This is NOT a pressure injury Measurement: 5X2X1.2cm Wound bed: 95% red, 5% yellow Drainage (amount, consistency, odor) Mod amt yellow-tan drainage, no odor Periwound: intact skin surrounding Dressing procedure/placement/frequency: Pt is well-informed regarding plan of care as ordered by his surgeon when conversing in Spanish.  He applies moist gauze packing once a day; continue present plan of care and he can resume follow-up with the surgeon after discharge. Please re-consult if further assistance is needed.  Thank-you,  Cammie Mcgee MSN, RN, CWOCN, [place], CNS [phone]

## 2016-07-01 NOTE — Progress Notes (Signed)
ANTICOAGULATION CONSULT NOTE - Follow-up Consult  Pharmacy Consult for IV Heparin Indication: h/o Recent pulmonary embolus -diagnosed 06/19/16 on last hospitalization   No Known Allergies  Patient Measurements: Height: 5\' 6"  (167.6 cm) Weight: 256 lb 8 oz (116.3 kg) IBW/kg (Calculated) : 63.8 Heparin Dosing Weight: 90.8 kg  Vital Signs: Temp: 97.7 F (36.5 C) (08/01 0918) Temp Source: Oral (08/01 0918) BP: 115/62 (08/01 0918) Pulse Rate: 76 (08/01 0918)  Labs:  Recent Labs  06/30/16 1111 06/30/16 1645 07/01/16 0430 07/01/16 1359  HGB 13.2  --  12.9*  --   HCT 39.8  --  39.1  --   PLT 329  --  284  --   APTT  --  44* 45* 73*  HEPARINUNFRC  --  1.28* 0.35  --   CREATININE 7.16*  --  5.26*  --     Estimated Creatinine Clearance: 18.1 mL/min (by C-G formula based on SCr of 5.26 mg/dL).   Assessment: 60 y.o male on Xarelto PTA for recent diagnosis of PE 06/19/16 - now holding due to ARF. Last dose taken 7/31 0800. Baseline aPTT = 44 sec and heparin level 1.28 (affected by Xarelto) so utilizing PTT to monitor heparin.   PTT now therapeutic at 73 seconds on heparin at 1400 units / hr  Goal of Therapy:  Heparin level 0.3-0.7 units/ml aPTT 66-102 seconds Monitor platelets by anticoagulation protocol: Yes   Plan:  Increase heparin gtt to 1500 units/hr to prevent decrease to less than 66 seconds F/u AM PTT and CBC  Thank you Okey Regal, PharmD [phone] 07/01/2016,2:29 PM

## 2016-07-01 NOTE — Progress Notes (Signed)
PROGRESS NOTE                                                                                                                                                                                                             Patient Demographics:    Justin Sampson, is a 60 y.o. male, DOB - 1956/06/03, ZOX:[id number]  Admit date - 06/30/2016   Admitting Physician Ozella Rocks, MD  Outpatient Primary MD for the patient is Jaclyn Shaggy, MD  LOS - 0  Outpatient Specialists: [facility] [place] [facility]  Chief Complaint  Patient presents with  . abnormal lab work       Brief Narrative   60 y/o obese male recently admitted for buttock abscess with sepsis with associated NICM and acute hypoxic resp failure 2/2 DVT/PE. He was discharged on lasix, ACEi and started xarelto . Pt seen for out pt follow up and was found to have low BP and told to stop his coreg. Labs sent showed AKI and sent for hospitalization. Apart from lasix and ACEi pt reports taking naproxyn bid for his gluteal pain and alleve 2 tablets daily in am for his knee pain for several days.    Subjective:   Pt seen and examined. Discussed with phone spanish interpreter. Denies any symptoms   Assessment  & Plan :    Principal Problem:   Acute renal failure (ARF) (HCC) seocndary to combination of lasix, ACEi and regular NSAIDs use. discontinued all offenders. Gentle IV fluids. UA shows hyaline casts, UPEP ordered. Renal US unremarkable. Monitor renal fn closely. creatinine trending down. If fails to improve in 24-48 hrs,  , consult renal.  counseled on avoiding NSAIDs altogether.  Active Problems:   Gluteal abscess I&D on 7/17. Has full thickness rt gluteal wound, appears uninfected. Completed last dose of cefuroxime on the day of admission. Appreciate wound care recommendations.    Hypotension SBP in 80s on admission. Hold BP meds and hydrate with fluids    Cardiomyopathy,  nonischemic (HCC) Hypovolemic. Off BP meds. Recent cardiac cath shows no CAD.     Acute pulmonary embolism (HCC) D/c xarelto given poor renal fn. On IV heparin  Obesity  counseled on weight loss    Code Status : full code  Family Communication  : none  Disposition Plan  : home once renal fn improved , possibly 48-72  hrs  Barriers For Discharge : active symptyoms  Consults  :  none  Procedures  :none  DVT Prophylaxis  :  IV heparin  Lab Results  Component Value Date   PLT 284 07/01/2016    Antibiotics  :   Anti-infectives    None        Objective:   Vitals:   06/30/16 1532 06/30/16 2038 07/01/16 0439 07/01/16 0918  BP: (!) 111/53 (!) 160/79 107/62 115/62  Pulse: 69 68 90 76  Resp: 16 18 19 18   Temp: 97.5 F (36.4 C) 97.7 F (36.5 C) 97.7 F (36.5 C) 97.7 F (36.5 C)  TempSrc: Oral Oral Oral Oral  SpO2: 100% 97% 100% 99%  Weight: 116.5 kg (256 lb 14.4 oz) 116.3 kg (256 lb 8 oz)    Height: 5\' 6"  (1.676 m)       Wt Readings from Last 3 Encounters:  06/30/16 116.3 kg (256 lb 8 oz)  06/26/16 116.9 kg (257 lb 12.8 oz)  06/22/16 119.7 kg (264 lb)     Intake/Output Summary (Last 24 hours) at 07/01/16 1521 Last data filed at 07/01/16 1351  Gross per 24 hour  Intake          2810.94 ml  Output             1550 ml  Net          1260.94 ml     Physical Exam  Gen: obese male ,not in distress HEENT: no pallor, moist mucosa, supple neck Chest: clear b/l, no added sounds CVS: N S1&S2, no murmurs,  GI: soft, NT, ND, BS+ Musculoskeletal: warm, no edema, rt gluteal wound      Data Review:    CBC  Recent Labs Lab 06/30/16 1111 07/01/16 0430  WBC 8.7 6.5  HGB 13.2 12.9*  HCT 39.8 39.1  PLT 329 284  MCV 93.6 93.1  MCH 31.1 30.7  MCHC 33.2 33.0  RDW 14.7 14.7    Chemistries   Recent Labs Lab 06/30/16 1111 07/01/16 0430  NA 132* 137  K 4.7 4.5  CL 100* 108  CO2 21* 20*  GLUCOSE 105* 80  BUN 37* 33*  CREATININE 7.16* 5.26*    CALCIUM 9.4 9.1  AST  --  17  ALT  --  9*  ALKPHOS  --  60  BILITOT  --  0.5   ------------------------------------------------------------------------------------------------------------------ No results for input(s): CHOL, HDL, LDLCALC, TRIG, CHOLHDL, LDLDIRECT in the last 72 hours.  Lab Results  Component Value Date   HGBA1C 5.8 (H) 06/13/2016   ------------------------------------------------------------------------------------------------------------------ No results for input(s): TSH, T4TOTAL, T3FREE, THYROIDAB in the last 72 hours.  Invalid input(s): FREET3 ------------------------------------------------------------------------------------------------------------------ No results for input(s): VITAMINB12, FOLATE, FERRITIN, TIBC, IRON, RETICCTPCT in the last 72 hours.  Coagulation profile No results for input(s): INR, PROTIME in the last 168 hours.  No results for input(s): DDIMER in the last 72 hours.  Cardiac Enzymes No results for input(s): CKMB, TROPONINI, MYOGLOBIN in the last 168 hours.  Invalid input(s): CK ------------------------------------------------------------------------------------------------------------------    Component Value Date/Time   BNP 21.4 06/30/2016 1111    Inpatient Medications  Scheduled Meds: . sodium chloride flush  3 mL Intravenous Q12H   Continuous Infusions: . sodium chloride 100 mL/hr at 07/01/16 1047  . heparin 1,500 Units/hr (07/01/16 1449)   PRN Meds:.acetaminophen **OR** acetaminophen, hydrALAZINE, ondansetron **OR** ondansetron (ZOFRAN) IV  Micro Results No results found for this or any previous visit (from the past 240 hour(s)).  Radiology  Reports Dg Chest 2 View  Result Date: 06/30/2016 CLINICAL DATA:  Shortness of breath today, weakness. EXAM: CHEST  2 VIEW COMPARISON:  06/10/2016 FINDINGS: Heart is borderline in size, decreased in size since prior study. No confluent airspace opacity or effusion. No acute bony  abnormality. IMPRESSION: Borderline heart size.  No active disease. Electronically Signed   By: Charlett Nose M.D.   On: 06/30/2016 11:57  Ct Angio Chest Pe W Or Wo Contrast  Addendum Date: 06/19/2016   ADDENDUM REPORT: 06/19/2016 14:57 ADDENDUM: I discussed the findings of this CTA chest with Dr. Isidoro Donning at 2:48 p.m. Also I had Dr. Archer Asa review the case and Interventional Radiology would not be helpful since there is no evidence of right heart strain present. Electronically Signed   By: Dwyane Dee M.D.   On: 06/19/2016 14:57  Result Date: 06/19/2016 CLINICAL DATA:  Mid chest tightness, some shortness of breath EXAM: CT ANGIOGRAPHY CHEST WITH CONTRAST TECHNIQUE: Multidetector CT imaging of the chest was performed using the standard protocol during bolus administration of intravenous contrast. Multiplanar CT image reconstructions and MIPs were obtained to evaluate the vascular anatomy. CONTRAST:  100 cc Isovue 370 COMPARISON:  Portable chest x-ray of 06/10/2016 FINDINGS: There is a large pulmonary embolus which extends from the distal main right pulmonary artery origin into the right upper lobe and right lower lobe branches partially occluding branches to both the right upper lobe and right lower lobe segments. No definite pulmonary emboli are seen involving the left pulmonary artery and its branches. The RV over LV ratio is within normal limits, with no evidence of right heart strain. The thoracic aorta is not well opacified but no acute abnormality is seen. The mid ascending thoracic aorta measures 32 mm in diameter. There is a large amount of epicardial fat present. No mediastinal or hilar adenopathy is seen. On images through the upper abdomen there may be a tiny faintly calcified gallstone layering dependently within the gallbladder. The thyroid gland is unremarkable. On lung window images, there is bibasilar atelectasis present. Within the posterior medial right lower lobe there is peripheral parenchymal  opacity suspicious for a focus of pulmonary infarction in view of the degree of largely occlusive pulmonary embolism to that segment. Some mosaic profusion also is present as a result of the vascular abnormality. No lung nodule or mass is seen. The central airway is patent. There are degenerative changes throughout the mid to lower thoracic spine. Review of the MIP images confirms the above findings. IMPRESSION: 1. Significant pulmonary emboli involving the distal right main pulmonary artery extending into branches to the right upper lobe and right lower lobe with partial occlusion to both the right upper lobe and lower lobe branches. 2. Peripheral wedge-shaped parenchymal opacity in the posterior medial right lower lobe suspicious for a focus of infarction in view of the degree of partially occlusive pulmonary emboli to that segment. Electronically Signed: By: Dwyane Dee M.D. On: 06/19/2016 14:45   Ct Abdomen Pelvis W Contrast  Result Date: 06/10/2016 CLINICAL DATA:  Perirectal pain with focal wound EXAM: CT ABDOMEN AND PELVIS WITH CONTRAST TECHNIQUE: Multidetector CT imaging of the abdomen and pelvis was performed using the standard protocol following bolus administration of intravenous contrast. CONTRAST:  100 mL ISOVUE-300 IOPAMIDOL (ISOVUE-300) INJECTION 61% COMPARISON:  None. FINDINGS: Lower chest:  No acute findings. Hepatobiliary: The liver is mildly fatty infiltrated. The gallbladder is within normal limits. Pancreas: No mass, inflammatory changes, or other significant abnormality. Spleen: Within normal limits in  size and appearance. Adrenals/Urinary Tract: No masses identified. No evidence of hydronephrosis. Small right renal cyst is seen. The bladder is well distended. Stomach/Bowel: No evidence of obstruction, inflammatory process, or abnormal fluid collections. The appendix is within normal limits. Mild diverticular change is noted without evidence of diverticulitis. Vascular/Lymphatic: No  pathologically enlarged lymph nodes. No evidence of abdominal aortic aneurysm. Reproductive: No mass or other significant abnormality. Other: Fat containing inguinal hernia is seen. In the right gluteal region medially of there is some inflammatory change and a small amount of subcutaneous air likely representing inflammation. Additionally more superiorly adjacent to the prostate and rectum there are areas of air and fluid identified. The largest of these lies just lateral to the prostate and measures 3.4 x 2.4 x 14.7 cm in greatest AP, transverse and craniocaudad projections respectively. This is best visualized on image number 99 of series 2 as well as images 111 of series 5 and 97 of series 6. These are consistent with small abscesses Musculoskeletal:  No suspicious bone lesions identified. IMPRESSION: Air-fluid collections adjacent to the prostate laterally on the right consistent with abscesses. Smaller collection is noted somewhat more posteriorly adjacent to the distal rectum. Additionally some inflammatory changes in the right gluteal region medially are seen. Electronically Signed   By: Alcide Clever M.D.   On: 06/10/2016 18:21   US Renal  Result Date: 06/30/2016 CLINICAL DATA:  Renal failure, hypertension, non ischemic cardiomyopathy, obesity EXAM: RENAL / URINARY TRACT ULTRASOUND COMPLETE COMPARISON:  None; correlation CT abdomen and pelvis 06/10/2016 FINDINGS: Right Kidney: Length: 11.8 cm. Normal cortical thickness. Upper normal cortical echogenicity. No mass, hydronephrosis or shadowing calcification. Left Kidney: Length: 13.1 cm. Normal cortical thickness. Upper normal cortical echogenicity. No mass, hydronephrosis or shadowing calcification. Bladder: Decompressed, unable to adequately assess IMPRESSION: No definite sonographic urinary tract abnormalities identified. Electronically Signed   By: Ulyses Southward M.D.   On: 06/30/2016 19:29   Dg Chest Port 1 View  Result Date: 06/10/2016 CLINICAL  DATA:  Possible sepsis EXAM: PORTABLE CHEST 1 VIEW COMPARISON:  None. FINDINGS: The heart size is enlarged. There is no focal infiltrate, pulmonary edema, or pleural effusion. The lung volumes are low. Mediastinal contour is unremarkable given the AP semi-erect technique. The visualized skeletal structures are unremarkable. IMPRESSION: Cardiomegaly.  No focal pneumonia. Electronically Signed   By: Sherian Rein M.D.   On: 06/10/2016 16:47    Time Spent in minutes  25   Eddie North M.D on 07/01/2016 at 3:21 PM  Between 7am to 7pm - Pager - [phone]  After 7pm go to [email or web address] - password [facility]  [facility] -  Office  [phone]

## 2016-07-01 NOTE — Progress Notes (Signed)
ANTICOAGULATION CONSULT NOTE - Follow-up Consult  Pharmacy Consult for IV Heparin Indication: h/o Recent pulmonary embolus -diagnosed 06/19/16 on last hospitalization   No Known Allergies  Patient Measurements: Height: 5\' 6"  (167.6 cm) Weight: 256 lb 8 oz (116.3 kg) IBW/kg (Calculated) : 63.8 Heparin Dosing Weight: 90.8 kg  Vital Signs: Temp: 97.7 F (36.5 C) (08/01 0439) Temp Source: Oral (08/01 0439) BP: 107/62 (08/01 0439) Pulse Rate: 90 (08/01 0439)  Labs:  Recent Labs  06/30/16 1111 06/30/16 1645 07/01/16 0430  HGB 13.2  --  12.9*  HCT 39.8  --  39.1  PLT 329  --  284  APTT  --  44* 45*  HEPARINUNFRC  --  1.28* 0.35  CREATININE 7.16*  --  5.26*    Estimated Creatinine Clearance: 18.1 mL/min (by C-G formula based on SCr of 5.26 mg/dL).   Assessment: 60 y.o male on Xarelto PTA for recent diagnosis of PE 06/19/16 - now holding due to ARF. Last dose taken 7/31 0800. Baseline aPTT = 44 sec and heparin level 1.28 (affected by Xarelto) so utilizing PTT to monitor heparin. PTT 45 sec (subtherapeutic) on 1050 units/hr. No issues with line or bleeding reported per RN.  Goal of Therapy:  Heparin level 0.3-0.7 units/ml aPTT 66-102 seconds Monitor platelets by anticoagulation protocol: Yes   Plan:  Increase heparin gtt to 1400 units/hr F/u 8 hr PTT  Thank you for allowing pharmacy to be part of this patients care team.  Christoper Fabian, PharmD, BCPS Clinical pharmacist, pager [phone] 07/01/2016,5:38 AM

## 2016-07-01 NOTE — Progress Notes (Signed)
[facility]  Patient Status: Active (receiving services up to time of hospitalization)  [facility] is providing the following services: RN and MSW  If patient discharges after hours, please call [phone].   Justin Sampson 07/01/2016, 11:13 AM

## 2016-07-02 DIAGNOSIS — I429 Cardiomyopathy, unspecified: Secondary | ICD-10-CM

## 2016-07-02 DIAGNOSIS — N179 Acute kidney failure, unspecified: Principal | ICD-10-CM

## 2016-07-02 LAB — PROTEIN ELECTROPHORESIS, SERUM
A/G RATIO SPE: 0.9 (ref 0.7–1.7)
ALBUMIN ELP: 3.1 g/dL (ref 2.9–4.4)
ALPHA-2-GLOBULIN: 0.8 g/dL (ref 0.4–1.0)
Alpha-1-Globulin: 0.3 g/dL (ref 0.0–0.4)
BETA GLOBULIN: 1 g/dL (ref 0.7–1.3)
Gamma Globulin: 1.4 g/dL (ref 0.4–1.8)
Globulin, Total: 3.5 g/dL (ref 2.2–3.9)
Total Protein ELP: 6.6 g/dL (ref 6.0–8.5)

## 2016-07-02 LAB — BASIC METABOLIC PANEL
Anion gap: 12 (ref 5–15)
BUN: 22 mg/dL — ABNORMAL HIGH (ref 6–20)
CALCIUM: 9.3 mg/dL (ref 8.9–10.3)
CO2: 18 mmol/L — ABNORMAL LOW (ref 22–32)
Chloride: 108 mmol/L (ref 101–111)
Creatinine, Ser: 3.11 mg/dL — ABNORMAL HIGH (ref 0.61–1.24)
GFR calc Af Amer: 24 mL/min — ABNORMAL LOW (ref >60)
GFR, EST NON AFRICAN AMERICAN: 21 mL/min — AB (ref >60)
Glucose, Bld: 79 mg/dL (ref 65–99)
Potassium: 4.1 mmol/L (ref 3.5–5.1)
Sodium: 138 mmol/L (ref 135–145)

## 2016-07-02 LAB — CBC
HCT: 38.4 % — ABNORMAL LOW (ref 39.0–52.0)
HEMOGLOBIN: 12.7 g/dL — AB (ref 13.0–17.0)
MCH: 31.1 pg (ref 26.0–34.0)
MCHC: 33.1 g/dL (ref 30.0–36.0)
MCV: 94.1 fL (ref 78.0–100.0)
Platelets: 267 10*3/uL (ref 150–400)
RBC: 4.08 MIL/uL — AB (ref 4.22–5.81)
RDW: 14.7 % (ref 11.5–15.5)
WBC: 5.9 10*3/uL (ref 4.0–10.5)

## 2016-07-02 LAB — IMMUNOFIXATION, URINE

## 2016-07-02 LAB — HEPARIN LEVEL (UNFRACTIONATED): Heparin Unfractionated: 0.37 IU/mL (ref 0.30–0.70)

## 2016-07-02 LAB — APTT: APTT: 84 s — AB (ref 24–36)

## 2016-07-02 NOTE — Progress Notes (Signed)
PROGRESS NOTE                                                                                                                                                                                                             Patient Demographics:    Justin Sampson, is a 60 y.o. male, DOB - February 08, 1956, [id number]  Admit date - 06/30/2016   Admitting Physician Ozella Rocks, MD  Outpatient Primary MD for the patient is Jaclyn Shaggy, MD  LOS - 1  Outpatient Specialists: [facility] cardiology  Chief Complaint  Patient presents with  . abnormal lab work       Brief Narrative   60 y/o obese male recently admitted for buttock abscess with sepsis with associated NICM and acute hypoxic resp failure 2/2 DVT/PE. He was discharged on lasix, ACEi and started xarelto . Pt seen for out pt follow up and was found to have low BP and told to stop his coreg. Labs sent showed AKI and sent for hospitalization. Apart from lasix and ACEi pt reports taking naproxyn bid for his gluteal pain and alleve 2 tablets daily in am for his knee pain for several days.    Subjective:   Pt seen and examined . Denies any symptoms   Assessment  & Plan :    Principal Problem:   Acute renal failure (ARF) (HCC) - seocndary to combination of lasix, ACEi and regular NSAIDs use. discontinued all offenders. Gentle IV fluids. UA shows hyaline casts, UPEP ordered. Renal US unremarkable. - creatinine trending down, 3.11 today,  counseled on avoiding NSAIDs altogether.  Gluteal abscess - I&D on 7/17. Has full thickness rt gluteal wound, appears uninfected. Completed last dose of cefuroxime on the day of admission. Appreciate wound care recommendations.  Hypotension - SBP in 80s on admission. Hold BP meds and hydrate with fluids  Cardiomyopathy, nonischemic (HCC) - Hypovolemic. Off BP meds. Recent cardiac cath shows no CAD, But pressure remains on the lower side,  continue to hold beta blockers, and currently no ACEI giving his renal failure   Acute pulmonary embolism (HCC) - D/c xarelto given poor renal fn. On IV heparin  Obesity  - counseled on weight loss  Chronic Respiratory failure - On 3 L nasal cannula at home, this is most likely related to recent PE.  Presumed OSA - Scheduled for sleep  study on 8/29  Code Status : full code  Family Communication  : none  Disposition Plan  : home once renal fn improved , possibly 48-72 hrs  Consults  :  none  Procedures  :none  DVT Prophylaxis  :  IV heparin  Lab Results  Component Value Date   PLT 267 07/02/2016    Antibiotics  :   Anti-infectives    None        Objective:   Vitals:   07/01/16 1800 07/01/16 2045 07/02/16 0517 07/02/16 0929  BP: (!) 110/57 (!) 107/41 119/77 115/71  Pulse: 67 66 83 62  Resp: Temp: 97.8 F (36.6 C) 97.5 F (36.4 C) 97.8 F (36.6 C) 97.6 F (36.4 C)  TempSrc: Oral Oral Oral Oral  SpO2: 98% 97% 97% 100%  Weight:  116.7 kg (257 lb 4.8 oz)    Height:        Wt Readings from Last 3 Encounters:  07/01/16 116.7 kg (257 lb 4.8 oz)  06/26/16 116.9 kg (257 lb 12.8 oz)  06/22/16 119.7 kg (264 lb)     Intake/Output Summary (Last 24 hours) at 07/02/16 1610 Last data filed at 07/02/16 1325  Gross per 24 hour  Intake              720 ml  Output             2500 ml  Net            -1780 ml     Physical Exam  Gen: obese male ,not in distress HEENT: no pallor, moist mucosa, supple neck Chest: clear b/l, no added sounds CVS: N S1&S2, no murmurs,  GI: soft, NT, ND, BS+ Musculoskeletal: warm, no edema, rt gluteal wound      Data Review:    CBC  Recent Labs Lab 06/30/16 1111 07/01/16 0430 07/02/16 0657  WBC 8.7 6.5 5.9  HGB 13.2 12.9* 12.7*  HCT 39.8 39.1 38.4*  PLT 329 284 267  MCV 93.6 93.1 94.1  MCH 31.1 30.7 31.1  MCHC 33.2 33.0 33.1  RDW 14.7 14.7 14.7    Chemistries   Recent Labs Lab 06/30/16 1111  07/01/16 0430 07/02/16 0657  NA 132* 137 138  K 4.7 4.5 4.1  CL 100* 108 108  CO2 21* 20* 18*  GLUCOSE 105* 80 79  BUN 37* 33* 22*  CREATININE 7.16* 5.26* 3.11*  CALCIUM 9.4 9.1 9.3  AST  --  17  --   ALT  --  9*  --   ALKPHOS  --  60  --   BILITOT  --  0.5  --    ------------------------------------------------------------------------------------------------------------------ No results for input(s): CHOL, HDL, LDLCALC, TRIG, CHOLHDL, LDLDIRECT in the last 72 hours.  Lab Results  Component Value Date   HGBA1C 5.8 (H) 06/13/2016   ------------------------------------------------------------------------------------------------------------------ No results for input(s): TSH, T4TOTAL, T3FREE, THYROIDAB in the last 72 hours.  Invalid input(s): FREET3 ------------------------------------------------------------------------------------------------------------------ No results for input(s): VITAMINB12, FOLATE, FERRITIN, TIBC, IRON, RETICCTPCT in the last 72 hours.  Coagulation profile No results for input(s): INR, PROTIME in the last 168 hours.  No results for input(s): DDIMER in the last 72 hours.  Cardiac Enzymes No results for input(s): CKMB, TROPONINI, MYOGLOBIN in the last 168 hours.  Invalid input(s): CK ------------------------------------------------------------------------------------------------------------------    Component Value Date/Time   BNP 21.4 06/30/2016 1111    Inpatient Medications  Scheduled Meds: . sodium chloride flush  3 mL Intravenous Q12H  Continuous Infusions: . sodium chloride 100 mL/hr at 07/01/16 1047  . heparin 1,500 Units/hr (07/02/16 1151)   PRN Meds:.acetaminophen **OR** acetaminophen, hydrALAZINE, ondansetron **OR** ondansetron (ZOFRAN) IV  Micro Results No results found for this or any previous visit (from the past 240 hour(s)).  Radiology Reports Dg Chest 2 View  Result Date: 06/30/2016 CLINICAL DATA:  Shortness of breath  today, weakness. EXAM: CHEST  2 VIEW COMPARISON:  06/10/2016 FINDINGS: Heart is borderline in size, decreased in size since prior study. No confluent airspace opacity or effusion. No acute bony abnormality. IMPRESSION: Borderline heart size.  No active disease. Electronically Signed   By: Charlett Nose M.D.   On: 06/30/2016 11:57  Ct Angio Chest Pe W Or Wo Contrast  Addendum Date: 06/19/2016   ADDENDUM REPORT: 06/19/2016 14:57 ADDENDUM: I discussed the findings of this CTA chest with Dr. Isidoro Donning at 2:48 p.m. Also I had Dr. Archer Asa review the case and Interventional Radiology would not be helpful since there is no evidence of right heart strain present. Electronically Signed   By: Dwyane Dee M.D.   On: 06/19/2016 14:57  Result Date: 06/19/2016 CLINICAL DATA:  Mid chest tightness, some shortness of breath EXAM: CT ANGIOGRAPHY CHEST WITH CONTRAST TECHNIQUE: Multidetector CT imaging of the chest was performed using the standard protocol during bolus administration of intravenous contrast. Multiplanar CT image reconstructions and MIPs were obtained to evaluate the vascular anatomy. CONTRAST:  100 cc Isovue 370 COMPARISON:  Portable chest x-ray of 06/10/2016 FINDINGS: There is a large pulmonary embolus which extends from the distal main right pulmonary artery origin into the right upper lobe and right lower lobe branches partially occluding branches to both the right upper lobe and right lower lobe segments. No definite pulmonary emboli are seen involving the left pulmonary artery and its branches. The RV over LV ratio is within normal limits, with no evidence of right heart strain. The thoracic aorta is not well opacified but no acute abnormality is seen. The mid ascending thoracic aorta measures 32 mm in diameter. There is a large amount of epicardial fat present. No mediastinal or hilar adenopathy is seen. On images through the upper abdomen there may be a tiny faintly calcified gallstone layering dependently  within the gallbladder. The thyroid gland is unremarkable. On lung window images, there is bibasilar atelectasis present. Within the posterior medial right lower lobe there is peripheral parenchymal opacity suspicious for a focus of pulmonary infarction in view of the degree of largely occlusive pulmonary embolism to that segment. Some mosaic profusion also is present as a result of the vascular abnormality. No lung nodule or mass is seen. The central airway is patent. There are degenerative changes throughout the mid to lower thoracic spine. Review of the MIP images confirms the above findings. IMPRESSION: 1. Significant pulmonary emboli involving the distal right main pulmonary artery extending into branches to the right upper lobe and right lower lobe with partial occlusion to both the right upper lobe and lower lobe branches. 2. Peripheral wedge-shaped parenchymal opacity in the posterior medial right lower lobe suspicious for a focus of infarction in view of the degree of partially occlusive pulmonary emboli to that segment. Electronically Signed: By: Dwyane Dee M.D. On: 06/19/2016 14:45   Ct Abdomen Pelvis W Contrast  Result Date: 06/10/2016 CLINICAL DATA:  Perirectal pain with focal wound EXAM: CT ABDOMEN AND PELVIS WITH CONTRAST TECHNIQUE: Multidetector CT imaging of the abdomen and pelvis was performed using the standard protocol following bolus administration of  intravenous contrast. CONTRAST:  100 mL ISOVUE-300 IOPAMIDOL (ISOVUE-300) INJECTION 61% COMPARISON:  None. FINDINGS: Lower chest:  No acute findings. Hepatobiliary: The liver is mildly fatty infiltrated. The gallbladder is within normal limits. Pancreas: No mass, inflammatory changes, or other significant abnormality. Spleen: Within normal limits in size and appearance. Adrenals/Urinary Tract: No masses identified. No evidence of hydronephrosis. Small right renal cyst is seen. The bladder is well distended. Stomach/Bowel: No evidence of  obstruction, inflammatory process, or abnormal fluid collections. The appendix is within normal limits. Mild diverticular change is noted without evidence of diverticulitis. Vascular/Lymphatic: No pathologically enlarged lymph nodes. No evidence of abdominal aortic aneurysm. Reproductive: No mass or other significant abnormality. Other: Fat containing inguinal hernia is seen. In the right gluteal region medially of there is some inflammatory change and a small amount of subcutaneous air likely representing inflammation. Additionally more superiorly adjacent to the prostate and rectum there are areas of air and fluid identified. The largest of these lies just lateral to the prostate and measures 3.4 x 2.4 x 14.7 cm in greatest AP, transverse and craniocaudad projections respectively. This is best visualized on image number 99 of series 2 as well as images 111 of series 5 and 97 of series 6. These are consistent with small abscesses Musculoskeletal:  No suspicious bone lesions identified. IMPRESSION: Air-fluid collections adjacent to the prostate laterally on the right consistent with abscesses. Smaller collection is noted somewhat more posteriorly adjacent to the distal rectum. Additionally some inflammatory changes in the right gluteal region medially are seen. Electronically Signed   By: Alcide Clever M.D.   On: 06/10/2016 18:21   US Renal  Result Date: 06/30/2016 CLINICAL DATA:  Renal failure, hypertension, non ischemic cardiomyopathy, obesity EXAM: RENAL / URINARY TRACT ULTRASOUND COMPLETE COMPARISON:  None; correlation CT abdomen and pelvis 06/10/2016 FINDINGS: Right Kidney: Length: 11.8 cm. Normal cortical thickness. Upper normal cortical echogenicity. No mass, hydronephrosis or shadowing calcification. Left Kidney: Length: 13.1 cm. Normal cortical thickness. Upper normal cortical echogenicity. No mass, hydronephrosis or shadowing calcification. Bladder: Decompressed, unable to adequately assess IMPRESSION:  No definite sonographic urinary tract abnormalities identified. Electronically Signed   By: Ulyses Southward M.D.   On: 06/30/2016 19:29   Dg Chest Port 1 View  Result Date: 06/10/2016 CLINICAL DATA:  Possible sepsis EXAM: PORTABLE CHEST 1 VIEW COMPARISON:  None. FINDINGS: The heart size is enlarged. There is no focal infiltrate, pulmonary edema, or pleural effusion. The lung volumes are low. Mediastinal contour is unremarkable given the AP semi-erect technique. The visualized skeletal structures are unremarkable. IMPRESSION: Cardiomegaly.  No focal pneumonia. Electronically Signed   By: Sherian Rein M.D.   On: 06/10/2016 16:47     Kyrene Longan M.D on 07/02/2016 at 4:10 PM  Between 7am to 7pm - Pager - [phone]  After 7pm go to [email or web address] - password [facility]  [facility] -  Office  [phone]

## 2016-07-02 NOTE — Hospital Discharge Follow-Up (Signed)
Transitional Care Clinic at [place]:  Patient known to [facility] and [facility]. This Case Manager spoke with Jasmine Pang, RN CM, and it was determined patient may benefit from case management services, medical management with the [facility] after discharge. This Case Manager met with patient at bedside [employer] used for language translation-Interpreter # [id number]) to provide patient with information about the [facility] at [facility] and [facility]. Patient indicated he was uncertain about follow-up with the [facility] and requested that this Case Manager follow-up with him at a later time. Will attempt to meet with patient at a later time. Will continue to follow patient's clinical progress closely.

## 2016-07-02 NOTE — Progress Notes (Signed)
ANTICOAGULATION CONSULT NOTE - Follow-up Consult  Pharmacy Consult for IV Heparin Indication: h/o Recent pulmonary embolus -diagnosed 06/19/16 on last hospitalization   No Known Allergies  Patient Measurements: Height: 5\' 6"  (167.6 cm) Weight: 257 lb 4.8 oz (116.7 kg) IBW/kg (Calculated) : 63.8 Heparin Dosing Weight: 90.8 kg  Vital Signs: Temp: 97.6 F (36.4 C) (08/02 0929) Temp Source: Oral (08/02 0929) BP: 115/71 (08/02 0929) Pulse Rate: 62 (08/02 0929)  Labs:  Recent Labs  06/30/16 1111  06/30/16 1645 07/01/16 0430 07/01/16 1359 07/02/16 0657  HGB 13.2  --   --  12.9*  --  12.7*  HCT 39.8  --   --  39.1  --  38.4*  PLT 329  --   --  284  --  267  APTT  --   < > 44* 45* 73* 84*  HEPARINUNFRC  --   --  1.28* 0.35  --  0.37  CREATININE 7.16*  --   --  5.26*  --  3.11*  < > = values in this interval not displayed.  Estimated Creatinine Clearance: 30.7 mL/min (by C-G formula based on SCr of 3.11 mg/dL).   Assessment: 60 y.o male on Xarelto PTA for recent diagnosis of PE 06/19/16 - now holding due to ARF. Last dose taken 7/31 0800. Baseline aPTT = 44 sec and heparin level 1.28 (affected by Xarelto) so utilizing PTT to monitor heparin.   PTT and HL therapeutic on Heparin  Goal of Therapy:  Heparin level 0.3-0.7 units/ml aPTT 66-102 seconds Monitor platelets by anticoagulation protocol: Yes   Plan:  Continue heparin at 1500 units / hr F/u AM Heparin level, CBC  Maybe able to resume Xarelto 8/3 if renal function continues to improve  Thank you Okey Regal, PharmD [phone] 07/02/2016,10:11 AM

## 2016-07-03 ENCOUNTER — Encounter (HOSPITAL_COMMUNITY): Payer: Self-pay

## 2016-07-03 LAB — BASIC METABOLIC PANEL
Anion gap: 9 (ref 5–15)
BUN: 15 mg/dL (ref 6–20)
CHLORIDE: 108 mmol/L (ref 101–111)
CO2: 22 mmol/L (ref 22–32)
CREATININE: 1.99 mg/dL — AB (ref 0.61–1.24)
Calcium: 9.2 mg/dL (ref 8.9–10.3)
GFR calc Af Amer: 41 mL/min — ABNORMAL LOW (ref >60)
GFR, EST NON AFRICAN AMERICAN: 35 mL/min — AB (ref >60)
GLUCOSE: 81 mg/dL (ref 65–99)
POTASSIUM: 3.7 mmol/L (ref 3.5–5.1)
Sodium: 139 mmol/L (ref 135–145)

## 2016-07-03 LAB — CBC
HCT: 36.5 % — ABNORMAL LOW (ref 39.0–52.0)
HEMOGLOBIN: 12.2 g/dL — AB (ref 13.0–17.0)
MCH: 31.4 pg (ref 26.0–34.0)
MCHC: 33.4 g/dL (ref 30.0–36.0)
MCV: 94.1 fL (ref 78.0–100.0)
Platelets: 267 10*3/uL (ref 150–400)
RBC: 3.88 MIL/uL — AB (ref 4.22–5.81)
RDW: 14.5 % (ref 11.5–15.5)
WBC: 5.7 10*3/uL (ref 4.0–10.5)

## 2016-07-03 LAB — HEPARIN LEVEL (UNFRACTIONATED): Heparin Unfractionated: 0.2 IU/mL — ABNORMAL LOW (ref 0.30–0.70)

## 2016-07-03 MED ORDER — RIVAROXABAN 20 MG PO TABS
20.0000 mg | ORAL_TABLET | Freq: Every day | ORAL | Status: DC
  Filled 2016-07-03: qty 1

## 2016-07-03 MED ORDER — RIVAROXABAN 20 MG PO TABS
20.0000 mg | ORAL_TABLET | Freq: Every day | ORAL | Status: DC
  Administered 2016-07-03: 20 mg via ORAL
  Filled 2016-07-03 (×2): qty 1

## 2016-07-03 NOTE — Hospital Discharge Follow-Up (Signed)
Transitional Care Clinic at [place]:  This Case Manager once again met with patient to determine plans for primary care follow-up after discharge. Spero Geralds, [place] Interpreter, provided language translation. Discussed the medical management/case management services provided at the [facility] at [place], and patient indicated he was uncertain if he wanted to be part of Transitional Care program. Informed patient that this Case Manager could make him a follow-up appointment with Dr. Jarold Song who he saw on 06/26/16 at [facility] and [facility]. Patient indicated he did not want this Case Manager to schedule a follow-up appointment at this time. He indicated he or his family would contact this Case Manager or clinic if he wanted to schedule an appointment. Jasmine Pang, RN CM updated.

## 2016-07-03 NOTE — Progress Notes (Addendum)
ANTICOAGULATION CONSULT NOTE - Follow-up Consult  Pharmacy Consult for IV Heparin-->back to Xarelto Indication: h/o Recent pulmonary embolus -diagnosed 06/19/16 on last hospitalization   No Known Allergies  Patient Measurements: Height: 5\' 6"  (167.6 cm) Weight: 259 lb 0.7 oz (117.5 kg) IBW/kg (Calculated) : 63.8 Heparin Dosing Weight: 90.8 kg  Vital Signs: Temp: 97.7 F (36.5 C) (08/03 0743) Temp Source: Oral (08/03 0743) BP: 125/88 (08/03 0743) Pulse Rate: 90 (08/03 0743)  Labs:  Recent Labs  07/01/16 0430 07/01/16 1359 07/02/16 0657 07/03/16 0722  HGB 12.9*  --  12.7* 12.2*  HCT 39.1  --  38.4* 36.5*  PLT 284  --  267 267  APTT 45* 73* 84*  --   HEPARINUNFRC 0.35  --  0.37 0.20*  CREATININE 5.26*  --  3.11* 1.99*    Estimated Creatinine Clearance: 48.2 mL/min (by C-G formula based on SCr of 1.99 mg/dL).   Assessment: 60 y.o male on Xarelto PTA for recent diagnosis of PE 06/19/16 - now holding due to ARF. Last dose taken 7/31 0800. Baseline aPTT = 44 sec and heparin level 1.28 (affected by Xarelto) so utilizing PTT to monitor heparin.   Scr now improved to place him back on Xarelto  Goal of Therapy:  Monitor platelets by anticoagulation protocol: Yes   Plan:  Xarelto 20 mg po daily with supper DC heparin  Thank you Okey Regal, PharmD [phone] 07/03/2016,10:36 AM

## 2016-07-03 NOTE — Progress Notes (Signed)
PROGRESS NOTE                                                                                                                                                                                                             Patient Demographics:    Justin Sampson, is a 59 y.o. male, DOB - December 27, 1955, ZOX:[id number]  Admit date - 06/30/2016   Admitting Physician Ozella Rocks, MD  Outpatient Primary MD for the patient is Jaclyn Shaggy, MD  LOS - 2  Outpatient Specialists: [facility] cardiology  Chief Complaint  Patient presents with  . abnormal lab work       Brief Narrative   60 y/o obese male recently admitted for buttock abscess with sepsis with associated NICM and acute hypoxic resp failure 2/2 DVT/PE. He was discharged on lasix, ACEi and started xarelto . Pt seen for out pt follow up and was found to have low BP and told to stop his coreg. Labs sent showed AKI and sent for hospitalization. Apart from lasix and ACEi pt reports taking naproxyn bid for his gluteal pain and alleve 2 tablets daily in am for his knee pain for several days.    Subjective:   Pt seen and examined . Denies any symptoms   Assessment  & Plan :    Principal Problem:   Acute renal failure (ARF) (HCC) - seocndary to combination of lasix, ACEi and regular NSAIDs use. discontinued all offenders. Gentle IV fluids. UA shows hyaline casts, UPEP ordered. Renal US unremarkable. - creatinine trending down, 1.99 today,  counseled on avoiding NSAIDs altogether.  Gluteal abscess - I&D on 7/17. Has full thickness rt gluteal wound, appears uninfected. Completed last dose of cefuroxime on the day of admission. Appreciate wound care recommendations.  Hypotension - SBP in 80s on admission. Hold BP meds and hydrate with fluids  Cardiomyopathy, nonischemic (HCC) - Hypovolemic. Off BP meds. Recent cardiac cath shows no CAD, But pressure remains on the lower side,  continue to hold beta blockers, and currently no ACEI giving his renal failure   Acute pulmonary embolism (HCC) - On heparin GTT, with renal function, we'll resume on Xarelto today  Obesity  - counseled on weight loss  Chronic Respiratory failure - On 3 L nasal cannula at home, this is most likely related to recent PE.  Presumed OSA - Scheduled  for sleep study on 8/29  Code Status : full code  Family Communication  : none  Disposition Plan  : home once renal fn improved , possibly 48-72 hrs  Consults  :  none  Procedures  :none  DVT Prophylaxis  :  IV heparin  Lab Results  Component Value Date   PLT 267 07/03/2016    Antibiotics  :   Anti-infectives    None        Objective:   Vitals:   07/02/16 1749 07/02/16 2017 07/03/16 0506 07/03/16 0743  BP: 131/85 115/75 125/72 125/88  Pulse: 75 79 75 90  Resp: Temp: 97.4 F (36.3 C) 97.4 F (36.3 C) 97.5 F (36.4 C) 97.7 F (36.5 C)  TempSrc: Oral Oral Oral Oral  SpO2: 100% 98% 100% 97%  Weight:  117.5 kg (259 lb 0.7 oz)    Height:        Wt Readings from Last 3 Encounters:  07/02/16 117.5 kg (259 lb 0.7 oz)  06/26/16 116.9 kg (257 lb 12.8 oz)  06/22/16 119.7 kg (264 lb)     Intake/Output Summary (Last 24 hours) at 07/03/16 1518 Last data filed at 07/03/16 1330  Gross per 24 hour  Intake          6597.18 ml  Output             1575 ml  Net          5022.18 ml     Physical Exam  Gen: obese male ,not in distress HEENT: no pallor, moist mucosa, supple neck Chest: clear b/l, no added sounds CVS: N S1&S2, no murmurs,  GI: soft, NT, ND, BS+ Musculoskeletal: warm, no edema, rt gluteal wound      Data Review:    CBC  Recent Labs Lab 06/30/16 1111 07/01/16 0430 07/02/16 0657 07/03/16 0722  WBC 8.7 6.5 5.9 5.7  HGB 13.2 12.9* 12.7* 12.2*  HCT 39.8 39.1 38.4* 36.5*  PLT 329 284 267 267  MCV 93.6 93.1 94.1 94.1  MCH 31.1 30.7 31.1 31.4  MCHC 33.2 33.0 33.1 33.4  RDW 14.7  14.7 14.7 14.5    Chemistries   Recent Labs Lab 06/30/16 1111 07/01/16 0430 07/02/16 0657 07/03/16 0722  NA 132* 137 138 139  K 4.7 4.5 4.1 3.7  CL 100* 108 108 108  CO2 21* 20* 18* 22  GLUCOSE 105* 80 79 81  BUN 37* 33* 22* 15  CREATININE 7.16* 5.26* 3.11* 1.99*  CALCIUM 9.4 9.1 9.3 9.2  AST  --  17  --   --   ALT  --  9*  --   --   ALKPHOS  --  60  --   --   BILITOT  --  0.5  --   --    ------------------------------------------------------------------------------------------------------------------ No results for input(s): CHOL, HDL, LDLCALC, TRIG, CHOLHDL, LDLDIRECT in the last 72 hours.  Lab Results  Component Value Date   HGBA1C 5.8 (H) 06/13/2016   ------------------------------------------------------------------------------------------------------------------ No results for input(s): TSH, T4TOTAL, T3FREE, THYROIDAB in the last 72 hours.  Invalid input(s): FREET3 ------------------------------------------------------------------------------------------------------------------ No results for input(s): VITAMINB12, FOLATE, FERRITIN, TIBC, IRON, RETICCTPCT in the last 72 hours.  Coagulation profile No results for input(s): INR, PROTIME in the last 168 hours.  No results for input(s): DDIMER in the last 72 hours.  Cardiac Enzymes No results for input(s): CKMB, TROPONINI, MYOGLOBIN in the last 168 hours.  Invalid input(s): CK ------------------------------------------------------------------------------------------------------------------  Component Value Date/Time   BNP 21.4 06/30/2016 1111    Inpatient Medications  Scheduled Meds: . rivaroxaban  20 mg Oral Q supper  . sodium chloride flush  3 mL Intravenous Q12H   Continuous Infusions: . sodium chloride 100 mL/hr at 07/03/16 0547   PRN Meds:.acetaminophen **OR** acetaminophen, hydrALAZINE, ondansetron **OR** ondansetron (ZOFRAN) IV  Micro Results No results found for this or any previous visit  (from the past 240 hour(s)).  Radiology Reports Dg Chest 2 View  Result Date: 06/30/2016 CLINICAL DATA:  Shortness of breath today, weakness. EXAM: CHEST  2 VIEW COMPARISON:  06/10/2016 FINDINGS: Heart is borderline in size, decreased in size since prior study. No confluent airspace opacity or effusion. No acute bony abnormality. IMPRESSION: Borderline heart size.  No active disease. Electronically Signed   By: Charlett Nose M.D.   On: 06/30/2016 11:57  Ct Angio Chest Pe W Or Wo Contrast  Addendum Date: 06/19/2016   ADDENDUM REPORT: 06/19/2016 14:57 ADDENDUM: I discussed the findings of this CTA chest with Dr. Isidoro Donning at 2:48 p.m. Also I had Dr. Archer Asa review the case and Interventional Radiology would not be helpful since there is no evidence of right heart strain present. Electronically Signed   By: Dwyane Dee M.D.   On: 06/19/2016 14:57  Result Date: 06/19/2016 CLINICAL DATA:  Mid chest tightness, some shortness of breath EXAM: CT ANGIOGRAPHY CHEST WITH CONTRAST TECHNIQUE: Multidetector CT imaging of the chest was performed using the standard protocol during bolus administration of intravenous contrast. Multiplanar CT image reconstructions and MIPs were obtained to evaluate the vascular anatomy. CONTRAST:  100 cc Isovue 370 COMPARISON:  Portable chest x-ray of 06/10/2016 FINDINGS: There is a large pulmonary embolus which extends from the distal main right pulmonary artery origin into the right upper lobe and right lower lobe branches partially occluding branches to both the right upper lobe and right lower lobe segments. No definite pulmonary emboli are seen involving the left pulmonary artery and its branches. The RV over LV ratio is within normal limits, with no evidence of right heart strain. The thoracic aorta is not well opacified but no acute abnormality is seen. The mid ascending thoracic aorta measures 32 mm in diameter. There is a large amount of epicardial fat present. No mediastinal or hilar  adenopathy is seen. On images through the upper abdomen there may be a tiny faintly calcified gallstone layering dependently within the gallbladder. The thyroid gland is unremarkable. On lung window images, there is bibasilar atelectasis present. Within the posterior medial right lower lobe there is peripheral parenchymal opacity suspicious for a focus of pulmonary infarction in view of the degree of largely occlusive pulmonary embolism to that segment. Some mosaic profusion also is present as a result of the vascular abnormality. No lung nodule or mass is seen. The central airway is patent. There are degenerative changes throughout the mid to lower thoracic spine. Review of the MIP images confirms the above findings. IMPRESSION: 1. Significant pulmonary emboli involving the distal right main pulmonary artery extending into branches to the right upper lobe and right lower lobe with partial occlusion to both the right upper lobe and lower lobe branches. 2. Peripheral wedge-shaped parenchymal opacity in the posterior medial right lower lobe suspicious for a focus of infarction in view of the degree of partially occlusive pulmonary emboli to that segment. Electronically Signed: By: Dwyane Dee M.D. On: 06/19/2016 14:45   Ct Abdomen Pelvis W Contrast  Result Date: 06/10/2016 CLINICAL DATA:  Perirectal  pain with focal wound EXAM: CT ABDOMEN AND PELVIS WITH CONTRAST TECHNIQUE: Multidetector CT imaging of the abdomen and pelvis was performed using the standard protocol following bolus administration of intravenous contrast. CONTRAST:  100 mL ISOVUE-300 IOPAMIDOL (ISOVUE-300) INJECTION 61% COMPARISON:  None. FINDINGS: Lower chest:  No acute findings. Hepatobiliary: The liver is mildly fatty infiltrated. The gallbladder is within normal limits. Pancreas: No mass, inflammatory changes, or other significant abnormality. Spleen: Within normal limits in size and appearance. Adrenals/Urinary Tract: No masses identified. No  evidence of hydronephrosis. Small right renal cyst is seen. The bladder is well distended. Stomach/Bowel: No evidence of obstruction, inflammatory process, or abnormal fluid collections. The appendix is within normal limits. Mild diverticular change is noted without evidence of diverticulitis. Vascular/Lymphatic: No pathologically enlarged lymph nodes. No evidence of abdominal aortic aneurysm. Reproductive: No mass or other significant abnormality. Other: Fat containing inguinal hernia is seen. In the right gluteal region medially of there is some inflammatory change and a small amount of subcutaneous air likely representing inflammation. Additionally more superiorly adjacent to the prostate and rectum there are areas of air and fluid identified. The largest of these lies just lateral to the prostate and measures 3.4 x 2.4 x 14.7 cm in greatest AP, transverse and craniocaudad projections respectively. This is best visualized on image number 99 of series 2 as well as images 111 of series 5 and 97 of series 6. These are consistent with small abscesses Musculoskeletal:  No suspicious bone lesions identified. IMPRESSION: Air-fluid collections adjacent to the prostate laterally on the right consistent with abscesses. Smaller collection is noted somewhat more posteriorly adjacent to the distal rectum. Additionally some inflammatory changes in the right gluteal region medially are seen. Electronically Signed   By: Alcide Clever M.D.   On: 06/10/2016 18:21   US Renal  Result Date: 06/30/2016 CLINICAL DATA:  Renal failure, hypertension, non ischemic cardiomyopathy, obesity EXAM: RENAL / URINARY TRACT ULTRASOUND COMPLETE COMPARISON:  None; correlation CT abdomen and pelvis 06/10/2016 FINDINGS: Right Kidney: Length: 11.8 cm. Normal cortical thickness. Upper normal cortical echogenicity. No mass, hydronephrosis or shadowing calcification. Left Kidney: Length: 13.1 cm. Normal cortical thickness. Upper normal cortical  echogenicity. No mass, hydronephrosis or shadowing calcification. Bladder: Decompressed, unable to adequately assess IMPRESSION: No definite sonographic urinary tract abnormalities identified. Electronically Signed   By: Ulyses Southward M.D.   On: 06/30/2016 19:29   Dg Chest Port 1 View  Result Date: 06/10/2016 CLINICAL DATA:  Possible sepsis EXAM: PORTABLE CHEST 1 VIEW COMPARISON:  None. FINDINGS: The heart size is enlarged. There is no focal infiltrate, pulmonary edema, or pleural effusion. The lung volumes are low. Mediastinal contour is unremarkable given the AP semi-erect technique. The visualized skeletal structures are unremarkable. IMPRESSION: Cardiomegaly.  No focal pneumonia. Electronically Signed   By: Sherian Rein M.D.   On: 06/10/2016 16:47     Darneisha Windhorst M.D on 07/03/2016 at 3:18 PM  Between 7am to 7pm - Pager - [phone]  After 7pm go to [email or web address] - password [facility]  [facility] -  Office  [phone]

## 2016-07-04 LAB — BASIC METABOLIC PANEL
Anion gap: 9 (ref 5–15)
BUN: 10 mg/dL (ref 6–20)
CHLORIDE: 107 mmol/L (ref 101–111)
CO2: 23 mmol/L (ref 22–32)
CREATININE: 1.62 mg/dL — AB (ref 0.61–1.24)
Calcium: 9.1 mg/dL (ref 8.9–10.3)
GFR calc Af Amer: 52 mL/min — ABNORMAL LOW (ref >60)
GFR calc non Af Amer: 45 mL/min — ABNORMAL LOW (ref >60)
GLUCOSE: 85 mg/dL (ref 65–99)
POTASSIUM: 3.4 mmol/L — AB (ref 3.5–5.1)
SODIUM: 139 mmol/L (ref 135–145)

## 2016-07-04 LAB — CBC
HCT: 37.3 % — ABNORMAL LOW (ref 39.0–52.0)
HEMOGLOBIN: 12.4 g/dL — AB (ref 13.0–17.0)
MCH: 31.1 pg (ref 26.0–34.0)
MCHC: 33.2 g/dL (ref 30.0–36.0)
MCV: 93.5 fL (ref 78.0–100.0)
Platelets: 266 10*3/uL (ref 150–400)
RBC: 3.99 MIL/uL — AB (ref 4.22–5.81)
RDW: 14.5 % (ref 11.5–15.5)
WBC: 5.8 10*3/uL (ref 4.0–10.5)

## 2016-07-04 MED ORDER — FUROSEMIDE 80 MG PO TABS: 40.0000 mg | ORAL_TABLET | Freq: Every day | ORAL | 3 refills | Status: DC

## 2016-07-04 NOTE — Hospital Discharge Follow-Up (Signed)
This Case Manager received phone call from [facility] Spanish Interpreter who indicated Hospitalist indicated patient needing a hospital follow-up appointment. She indicated patient is agreeable to hospital follow-up appointment at [facility] and [facility]. Appointment scheduled for 07/08/16 at 1130 with Dr. Venetia Night. AVS updated. Spanish Interpreter updated patient who verbalized understanding.

## 2016-07-04 NOTE — Discharge Summary (Signed)
Justin Sampson, is a 60 y.o. male  DOB 08/14/1956  MRN [id number].  Admission date:  06/30/2016  Admitting Physician  Ozella Rocks, MD  Discharge Date:  07/04/2016   Primary MD  Jaclyn Shaggy, MD  Recommendations for primary care physician for things to follow:  - Please check BMP during next visit to ensure continual renal function improvement. -Patient instructed to resume lower dose Lasix on 07/06/2016, please monitor and adjust accordingly to his volume status - Lisinopril has been held on discharge, please reassess when appropriate to resume giving his cardiopathy.   Admission Diagnosis  Acute renal failure, unspecified acute renal failure type (HCC) [N17.9]   Discharge Diagnosis  Acute renal failure, unspecified acute renal failure type (HCC) [N17.9]    Principal Problem:   Acute renal failure (ARF) (HCC) Active Problems:   Gluteal abscess   Hypotension   Cardiomyopathy, nonischemic (HCC)   Acute pulmonary embolism (HCC)      Past Medical History:  Diagnosis Date  . Cardiomyopathy, nonischemic (HCC) 06/17/2016  . Hypertension   . Obesities, morbid (HCC)   . Snoring     Past Surgical History:  Procedure Laterality Date  . CARDIAC CATHETERIZATION N/A 06/16/2016   Procedure: Left Heart Cath and Coronary Angiography;  Surgeon: Corky Crafts, MD;  Location: [facility] INVASIVE CV LAB;  Service: Cardiovascular;  Laterality: N/A;  . INCISION AND DRAINAGE ABSCESS Right 06/10/2016   Procedure: INCISION AND DRAINAGE GLUTEAL ABSCESS;  Surgeon: Manus Rudd, MD;  Location: [facility] OR;  Service: General;  Laterality: Right;       History of present illness and  Hospital Course:     Kindly see H&P for history of present illness and admission details, please review complete Labs, Consult reports and Test reports for all details in brief  HPI  from the history and physical done on the day of admission  06/30/2016 HPI: Justin Sampson is a 60 y.o. male with medical history significant of  HTN, and CHF. Patient recently admitted from 06/10/2016 to 06/23/2016 for treatment of sepsis secondary to buttock abscess with associated nonischemic cardiomyopathy, and acute respiratory hypoxic failure secondary to pulmonary embolus. Since time of discharge patient states his overall physical condition continues to improve. He is near baseline. Patient states his only residual symptom is occasional nausea. Patient has not been back to see a physician since time of discharge. Patient has had home wound care with daily packing of his wound. Labs were drawn for routine one day ago. Patient was given report today that labs were grossly abnormal and he needed to come to the emergency room for emergent workup. Patient also endorses having low blood pressure 1 day ago at which time he was told to stop his carvedilol. Today blood pressure is again noted to be low and in light of patient's worsening renal function on labs he was told to hold his Lasix as well. Patient denies any fevers, cough, shortness of breath, palpitations, neck stiffness, headache, dysuria, frequency, urinary urgency or low stream velocity, or  lower extremity edema or weight gain.   Patient encounter aided by patient's son who acted as interpreter  ED Course: objective findings below, started on 1L NS bolus.     Hospital Course   60 y/o obese male recently admitted for buttock abscess with sepsis with associated NICM and acute hypoxic resp failure 2/2 DVT/PE. He was discharged on lasix, ACEi and started xarelto . Pt seen for out pt follow up and was found to have low BP and told to stop his coreg. Labs sent showed AKI and sent for hospitalization. Apart from lasix and ACEi pt reports taking naproxyn bid for his gluteal pain and alleve 2 tablets daily in am for his knee pain for several days.  Acute renal failure (ARF) (HCC) - Most likely prerenal , in  the setting of diuresis with Lasix, as well related to  ACEi and regular NSAIDs use. discontinued all offenders during hospital stay. Patient was kept on IV fluids, no signs of volume overload during hospital stay . - Lisinopril has been held at time of discharge, instructed not to use NSAIDs, Lasix dose was significantly reduced, from 80 mg twice a day to 40 mg oral daily to start on 8/6, especially with no signs of volume overload. - Creatinine is 1.6 on discharge   Gluteal abscess - I&D on 7/17. Has full thickness rt gluteal wound, appears uninfected. Completed last dose of cefuroxime on the day of admission. Appreciate wound care recommendations.  Hypotension - SBP in 80s on admission. Gradually improved during hospital stay, will be discharged on Coreg  Cardiomyopathy, nonischemic (HCC) - Hypovolemic, I put sensitive on admission. Off BP meds. Recent cardiac cath shows no CAD, , resumed on beta blockers on discharge, currently no ACEI giving his renal failure   Acute pulmonary embolism (HCC) - Initially On heparin GTT, transitioned to to Xarelto when renal function improved.  Obesity  - counseled on weight loss  Chronic Respiratory failure - On 3 L nasal cannula at home, this is most likely related to recent PE.  Presumed OSA - Scheduled for sleep study on 8/29   Discharge Condition:  Stable   Follow UP  Follow-up Information     [facility] Follow up on 07/08/2016.   Why:  Hospital follow-up appointment on 07/08/16 at 11:30 am with Dr. Venetia Night. Contact information: [address] [place] [address] [phone]            Discharge Instructions  and  Discharge Medications    Discharge Instructions    Diet - low sodium heart healthy    Complete by:  As directed   Discharge instructions    Complete by:  As directed   Follow with Primary MD Jaclyn Shaggy, MD on 8/8  Get CBC, CMP,  checked  by Primary MD  next visit.    Activity: As tolerated with Full fall precautions use walker/cane & assistance as needed   Disposition Home   Diet: Heart Healthy  , with feeding assistance and aspiration precautions.  For Heart failure patients - Check your Weight same time everyday, if you gain over 2 pounds, or you develop in leg swelling, experience more shortness of breath or chest pain, call your Primary MD immediately. Follow Cardiac Low Salt Diet and 1.5 lit/day fluid restriction.   On your next visit with your primary care physician please Get Medicines reviewed and adjusted.   Please request your Prim.MD to go over all Hospital Tests and Procedure/Radiological results  at the follow up, please get all Hospital records sent to your Prim MD by signing hospital release before you go home.   If you experience worsening of your admission symptoms, develop shortness of breath, life threatening emergency, suicidal or homicidal thoughts you must seek medical attention immediately by calling 911 or calling your MD immediately  if symptoms less severe.  You Must read complete instructions/literature along with all the possible adverse reactions/side effects for all the Medicines you take and that have been prescribed to you. Take any new Medicines after you have completely understood and accpet all the possible adverse reactions/side effects.   Do not drive, operating heavy machinery, perform activities at heights, swimming or participation in water activities or provide baby sitting services if your were admitted for syncope or siezures until you have seen by Primary MD or a Neurologist and advised to do so again.  Do not drive when taking Pain medications.    Do not take more than prescribed Pain, Sleep and Anxiety Medications  Special Instructions: If you have smoked or chewed Tobacco  in the last 2 yrs please stop smoking, stop any regular Alcohol  and or any Recreational drug use.  Wear Seat belts  while driving.   Please note  You were cared for by a hospitalist during your hospital stay. If you have any questions about your discharge medications or the care you received while you were in the hospital after you are discharged, you can call the unit and asked to speak with the hospitalist on call if the hospitalist that took care of you is not available. Once you are discharged, your primary care physician will handle any further medical issues. Please note that NO REFILLS for any discharge medications will be authorized once you are discharged, as it is imperative that you return to your primary care physician (or establish a relationship with a primary care physician if you do not have one) for your aftercare needs so that they can reassess your need for medications and monitor your lab values.   Increase activity slowly    Complete by:  As directed       Medication List    STOP taking these medications   cefUROXime 500 MG tablet Commonly known as:  CEFTIN   lisinopril 5 MG tablet Commonly known as:  PRINIVIL,ZESTRIL   naproxen 500 MG tablet Commonly known as:  NAPROSYN   potassium chloride SA 20 MEQ tablet Commonly known as:  K-DUR,KLOR-CON     TAKE these medications   carvedilol 3.125 MG tablet Commonly known as:  COREG Take 1 tablet (3.125 mg total) by mouth 2 (two) times daily with a meal. What changed:  Another medication with the same name was removed. Continue taking this medication, and follow the directions you see here.   furosemide 80 MG tablet Commonly known as:  LASIX Take 0.5 tablets (40 mg total) by mouth daily. Start taking on:  07/06/2016 What changed:  how much to take  when to take this   rivaroxaban 20 MG Tabs tablet Commonly known as:  XARELTO Take 1 tablet (20 mg total) by mouth daily with breakfast. Start taking on 07/12/16 Start taking on:  07/12/2016 What changed:  Another medication with the same name was removed. Continue taking this  medication, and follow the directions you see here.         Diet and Activity recommendation: See Discharge Instructions above   Consults obtained -  None   Major procedures  and Radiology Reports - PLEASE review detailed and final reports for all details, in brief -      Dg Chest 2 View  Result Date: 06/30/2016 CLINICAL DATA:  Shortness of breath today, weakness. EXAM: CHEST  2 VIEW COMPARISON:  06/10/2016 FINDINGS: Heart is borderline in size, decreased in size since prior study. No confluent airspace opacity or effusion. No acute bony abnormality. IMPRESSION: Borderline heart size.  No active disease. Electronically Signed   By: Charlett Nose M.D.   On: 06/30/2016 11:57  Ct Angio Chest Pe W Or Wo Contrast  Addendum Date: 06/19/2016   ADDENDUM REPORT: 06/19/2016 14:57 ADDENDUM: I discussed the findings of this CTA chest with Dr. Isidoro Donning at 2:48 p.m. Also I had Dr. Archer Asa review the case and Interventional Radiology would not be helpful since there is no evidence of right heart strain present. Electronically Signed   By: Dwyane Dee M.D.   On: 06/19/2016 14:57  Result Date: 06/19/2016 CLINICAL DATA:  Mid chest tightness, some shortness of breath EXAM: CT ANGIOGRAPHY CHEST WITH CONTRAST TECHNIQUE: Multidetector CT imaging of the chest was performed using the standard protocol during bolus administration of intravenous contrast. Multiplanar CT image reconstructions and MIPs were obtained to evaluate the vascular anatomy. CONTRAST:  100 cc Isovue 370 COMPARISON:  Portable chest x-ray of 06/10/2016 FINDINGS: There is a large pulmonary embolus which extends from the distal main right pulmonary artery origin into the right upper lobe and right lower lobe branches partially occluding branches to both the right upper lobe and right lower lobe segments. No definite pulmonary emboli are seen involving the left pulmonary artery and its branches. The RV over LV ratio is within normal limits, with no  evidence of right heart strain. The thoracic aorta is not well opacified but no acute abnormality is seen. The mid ascending thoracic aorta measures 32 mm in diameter. There is a large amount of epicardial fat present. No mediastinal or hilar adenopathy is seen. On images through the upper abdomen there may be a tiny faintly calcified gallstone layering dependently within the gallbladder. The thyroid gland is unremarkable. On lung window images, there is bibasilar atelectasis present. Within the posterior medial right lower lobe there is peripheral parenchymal opacity suspicious for a focus of pulmonary infarction in view of the degree of largely occlusive pulmonary embolism to that segment. Some mosaic profusion also is present as a result of the vascular abnormality. No lung nodule or mass is seen. The central airway is patent. There are degenerative changes throughout the mid to lower thoracic spine. Review of the MIP images confirms the above findings. IMPRESSION: 1. Significant pulmonary emboli involving the distal right main pulmonary artery extending into branches to the right upper lobe and right lower lobe with partial occlusion to both the right upper lobe and lower lobe branches. 2. Peripheral wedge-shaped parenchymal opacity in the posterior medial right lower lobe suspicious for a focus of infarction in view of the degree of partially occlusive pulmonary emboli to that segment. Electronically Signed: By: Dwyane Dee M.D. On: 06/19/2016 14:45   Ct Abdomen Pelvis W Contrast  Result Date: 06/10/2016 CLINICAL DATA:  Perirectal pain with focal wound EXAM: CT ABDOMEN AND PELVIS WITH CONTRAST TECHNIQUE: Multidetector CT imaging of the abdomen and pelvis was performed using the standard protocol following bolus administration of intravenous contrast. CONTRAST:  100 mL ISOVUE-300 IOPAMIDOL (ISOVUE-300) INJECTION 61% COMPARISON:  None. FINDINGS: Lower chest:  No acute findings. Hepatobiliary: The liver is  mildly fatty infiltrated.  The gallbladder is within normal limits. Pancreas: No mass, inflammatory changes, or other significant abnormality. Spleen: Within normal limits in size and appearance. Adrenals/Urinary Tract: No masses identified. No evidence of hydronephrosis. Small right renal cyst is seen. The bladder is well distended. Stomach/Bowel: No evidence of obstruction, inflammatory process, or abnormal fluid collections. The appendix is within normal limits. Mild diverticular change is noted without evidence of diverticulitis. Vascular/Lymphatic: No pathologically enlarged lymph nodes. No evidence of abdominal aortic aneurysm. Reproductive: No mass or other significant abnormality. Other: Fat containing inguinal hernia is seen. In the right gluteal region medially of there is some inflammatory change and a small amount of subcutaneous air likely representing inflammation. Additionally more superiorly adjacent to the prostate and rectum there are areas of air and fluid identified. The largest of these lies just lateral to the prostate and measures 3.4 x 2.4 x 14.7 cm in greatest AP, transverse and craniocaudad projections respectively. This is best visualized on image number 99 of series 2 as well as images 111 of series 5 and 97 of series 6. These are consistent with small abscesses Musculoskeletal:  No suspicious bone lesions identified. IMPRESSION: Air-fluid collections adjacent to the prostate laterally on the right consistent with abscesses. Smaller collection is noted somewhat more posteriorly adjacent to the distal rectum. Additionally some inflammatory changes in the right gluteal region medially are seen. Electronically Signed   By: Alcide Clever M.D.   On: 06/10/2016 18:21   US Renal  Result Date: 06/30/2016 CLINICAL DATA:  Renal failure, hypertension, non ischemic cardiomyopathy, obesity EXAM: RENAL / URINARY TRACT ULTRASOUND COMPLETE COMPARISON:  None; correlation CT abdomen and pelvis 06/10/2016  FINDINGS: Right Kidney: Length: 11.8 cm. Normal cortical thickness. Upper normal cortical echogenicity. No mass, hydronephrosis or shadowing calcification. Left Kidney: Length: 13.1 cm. Normal cortical thickness. Upper normal cortical echogenicity. No mass, hydronephrosis or shadowing calcification. Bladder: Decompressed, unable to adequately assess IMPRESSION: No definite sonographic urinary tract abnormalities identified. Electronically Signed   By: Ulyses Southward M.D.   On: 06/30/2016 19:29   Dg Chest Port 1 View  Result Date: 06/10/2016 CLINICAL DATA:  Possible sepsis EXAM: PORTABLE CHEST 1 VIEW COMPARISON:  None. FINDINGS: The heart size is enlarged. There is no focal infiltrate, pulmonary edema, or pleural effusion. The lung volumes are low. Mediastinal contour is unremarkable given the AP semi-erect technique. The visualized skeletal structures are unremarkable. IMPRESSION: Cardiomegaly.  No focal pneumonia. Electronically Signed   By: Sherian Rein M.D.   On: 06/10/2016 16:47    Micro Results     No results found for this or any previous visit (from the past 240 hour(s)).     Today   Subjective:   Justin Sampson today has no headache,no chest abdominal pain,no new weakness tingling or numbness, feels much better wants to go home today.   Objective:   Blood pressure 130/90, pulse 69, temperature 97.4 F (36.3 C), temperature source Oral, resp. rate 16, height 5\' 6"  (1.676 m), weight 118.2 kg (260 lb 8 oz), SpO2 98 %.   Intake/Output Summary (Last 24 hours) at 07/04/16 1254 Last data filed at 07/04/16 1610  Gross per 24 hour  Intake             2790 ml  Output             1350 ml  Net             1440 ml    Exam Gen: obese male ,not in distress HEENT:  no pallor, moist mucosa, supple neck Chest: clear b/l, no added sounds CVS: N S1&S2, no murmurs,  GI: soft, NT, ND, BS+ Musculoskeletal: warm, no edema, rt gluteal wound   Data Review   CBC w Diff: Lab Results  Component  Value Date   WBC 5.8 07/04/2016   HGB 12.4 (L) 07/04/2016   HCT 37.3 (L) 07/04/2016   PLT 266 07/04/2016   LYMPHOPCT 22 06/13/2016   MONOPCT 11 06/13/2016   EOSPCT 4 06/13/2016   BASOPCT 1 06/13/2016    CMP: Lab Results  Component Value Date   NA 139 07/04/2016   K 3.4 (L) 07/04/2016   CL 107 07/04/2016   CO2 23 07/04/2016   BUN 10 07/04/2016   CREATININE 1.62 (H) 07/04/2016   PROT 6.7 07/01/2016   ALBUMIN 2.9 (L) 07/01/2016   BILITOT 0.5 07/01/2016   ALKPHOS 60 07/01/2016   AST 17 07/01/2016   ALT 9 (L) 07/01/2016  .   Total Time in preparing paper work, data evaluation and todays exam - 35 minutes  Justin Sampson M.D on 07/04/2016 at 12:54 PM  [facility]   Office  [phone]

## 2016-07-04 NOTE — Progress Notes (Signed)
Patient being discharged to home with family. All discharge instructions reviewed with patient and family member. Patient and family understand the medications and agree to the follow up appt that was given. IV removed. Telemetry removed. All belongings with patient. Patient leaving floor in wheelchair.

## 2016-07-04 NOTE — Discharge Instructions (Signed)
Follow with Primary MD Jaclyn Shaggy, MD on 8/8  Get CBC, CMP,  checked  by Primary MD next visit.    Activity: As tolerated with Full fall precautions use walker/cane & assistance as needed   Disposition Home   Diet: Heart Healthy  , with feeding assistance and aspiration precautions.  For Heart failure patients - Check your Weight same time everyday, if you gain over 2 pounds, or you develop in leg swelling, experience more shortness of breath or chest pain, call your Primary MD immediately. Follow Cardiac Low Salt Diet and 1.5 lit/day fluid restriction.   On your next visit with your primary care physician please Get Medicines reviewed and adjusted.   Please request your Prim.MD to go over all Hospital Tests and Procedure/Radiological results at the follow up, please get all Hospital records sent to your Prim MD by signing hospital release before you go home.   If you experience worsening of your admission symptoms, develop shortness of breath, life threatening emergency, suicidal or homicidal thoughts you must seek medical attention immediately by calling 911 or calling your MD immediately  if symptoms less severe.  You Must read complete instructions/literature along with all the possible adverse reactions/side effects for all the Medicines you take and that have been prescribed to you. Take any new Medicines after you have completely understood and accpet all the possible adverse reactions/side effects.   Do not drive, operating heavy machinery, perform activities at heights, swimming or participation in water activities or provide baby sitting services if your were admitted for syncope or siezures until you have seen by Primary MD or a Neurologist and advised to do so again.  Do not drive when taking Pain medications.    Do not take more than prescribed Pain, Sleep and Anxiety Medications  Special Instructions: If you have smoked or chewed Tobacco  in the last 2 yrs please  stop smoking, stop any regular Alcohol  and or any Recreational drug use.  Wear Seat belts while driving.   Please note  You were cared for by a hospitalist during your hospital stay. If you have any questions about your discharge medications or the care you received while you were in the hospital after you are discharged, you can call the unit and asked to speak with the hospitalist on call if the hospitalist that took care of you is not available. Once you are discharged, your primary care physician will handle any further medical issues. Please note that NO REFILLS for any discharge medications will be authorized once you are discharged, as it is imperative that you return to your primary care physician (or establish a relationship with a primary care physician if you do not have one) for your aftercare needs so that they can reassess your need for medications and monitor your lab values.

## 2016-07-08 ENCOUNTER — Ambulatory Visit: Payer: Self-pay | Attending: Family Medicine | Admitting: Family Medicine

## 2016-07-08 ENCOUNTER — Encounter: Payer: Self-pay | Admitting: Family Medicine

## 2016-07-08 VITALS — BP 104/70 | Temp 98.2°F | Ht 66.0 in | Wt 252.0 lb

## 2016-07-08 DIAGNOSIS — Z6841 Body Mass Index (BMI) 40.0 and over, adult: Secondary | ICD-10-CM | POA: Insufficient documentation

## 2016-07-08 DIAGNOSIS — I2699 Other pulmonary embolism without acute cor pulmonale: Secondary | ICD-10-CM

## 2016-07-08 DIAGNOSIS — Z9889 Other specified postprocedural states: Secondary | ICD-10-CM | POA: Insufficient documentation

## 2016-07-08 DIAGNOSIS — I5023 Acute on chronic systolic (congestive) heart failure: Secondary | ICD-10-CM | POA: Insufficient documentation

## 2016-07-08 DIAGNOSIS — I5022 Chronic systolic (congestive) heart failure: Secondary | ICD-10-CM

## 2016-07-08 DIAGNOSIS — I110 Hypertensive heart disease with heart failure: Secondary | ICD-10-CM | POA: Insufficient documentation

## 2016-07-08 DIAGNOSIS — E662 Morbid (severe) obesity with alveolar hypoventilation: Secondary | ICD-10-CM

## 2016-07-08 DIAGNOSIS — N19 Unspecified kidney failure: Secondary | ICD-10-CM | POA: Insufficient documentation

## 2016-07-08 DIAGNOSIS — L0231 Cutaneous abscess of buttock: Secondary | ICD-10-CM | POA: Insufficient documentation

## 2016-07-08 DIAGNOSIS — Z79899 Other long term (current) drug therapy: Secondary | ICD-10-CM | POA: Insufficient documentation

## 2016-07-08 DIAGNOSIS — I10 Essential (primary) hypertension: Secondary | ICD-10-CM

## 2016-07-08 DIAGNOSIS — N289 Disorder of kidney and ureter, unspecified: Secondary | ICD-10-CM

## 2016-07-08 MED FILL — XARELTO 20 MG TABLET: 20 | 30 days supply | Qty: 30 | Fill #0

## 2016-07-08 NOTE — Progress Notes (Signed)
Was coughing up mucus yesterday- white/yellow in color x's 4 Feels extremely fatigued Home care comes once daily for dressing changes

## 2016-07-08 NOTE — Patient Instructions (Signed)
Insuficiencia cardaca (Heart Failure) La insuficiencia cardaca es una afeccin en la que el corazn tiene dificultad para bombear la [place]. El corazn no bombea sangre de [place] eficiente para que el organismo pueda funcionar bien. En algunos casos de insuficiencia cardaca, los lquidos vuelven a los pulmones, o puede haber hinchazn (edema) en la zona inferior de las piernas. La insuficiencia cardaca por lo general es una enfermedad de larga duracin (crnica). Es importante que se cuide mucho y que siga el plan de tratamiento que le indique su mdico. CAUSAS  Algunas enfermedades y condiciones pueden causar insuficiencia cardaca. Estas incluyen lo siguiente:  Presin arterial elevada (hipertensin). La hipertensin hace que el msculo cardaco trabaje ms de lo normal. Cuando la presin en los vasos sanguneos es alta, el corazn tiene que bombear (contraerse) con ms fuerza para Armed forces technical officer la sangre por todo el cuerpo. La hipertensin arterial hace que con el tiempo el corazn se vuelva rgido y dbil.  Enfermedad arterial coronaria (EAC). La enfermedad arterial coronaria es la acumulacin de colesterol y grasa (placa) en las arterias del corazn. La obstruccin de las arterias priva al msculo cardaco de sangre y oxgeno. Esto ocasiona dolor en el pecho y puede conducir a un infarto. La hipertensin arterial tambin favorece la enfermedad arterial coronaria.  Ataque cardaco (infarto de miocardio). El ataque cardaco se produce cuando se obstruye una o ms arterias del corazn. La prdida de oxgeno daa el tejido muscular cardaco. Cuando esto ocurre, una parte del msculo cardaco muere. El tejido daado no se contrae bien y Public house manager la capacidad del corazn para Chiropodist.  Vlvulas cardacas anormales. Cuando las vlvulas cardacas no se abren y cierran como corresponde, pueden causar insuficiencia cardaca. Esto hace que el msculo cardaco tenga que bombear con ms intensidad  para que la [place].  Enfermedad del msculo cardaco (miocardiopata o miocarditis). En esta enfermedad, el msculo cardaco est daado por diversas causas. Entre ellas se encuentran el consumo de alcohol o drogas, las infecciones o pueden ser causas desconocidas. Todas estas causas aumentan el riesgo de insuficiencia cardaca.  Enfermedad pulmonar. Enfermedad pulmonar que hace que el corazn se esfuerce ms porque los pulmones no funcionan correctamente. Esto puede hacer que el corazn se tensione, y causar insuficiencia.  Diabetes. La diabetes aumenta el riesgo de insuficiencia cardaca. El nivel elevado de azcar en la sangre contribuye a aumentar los [place] de grasas (lpidos) en la [place]. La diabetes tambin favorece que lentamente se daen los pequeos vasos sanguneos que transportan nutrientes importantes al el msculo cardaco. Cuando el corazn no obtiene oxgeno y alimento suficiente, se debilita y se torna rgido. Por lo tanto no se contrae de Set designer.  Hay otras enfermedades y condiciones que pueden contribuir a la insuficiencia cardaca. Entre ellas se incluyen el ritmo cardaco anormal, los problemas de tiroides y los recuentos bajos de glbulos rojos (anemia). Determinadas conductas perjudiciales aumentan el riesgo de insuficiencia cardaca, por ejemplo:  Tener sobrepeso.  Fumar o mascar tabaco.  Consumir alimentos ricos en grasas y colesterol.  Abusar de las drogas ilcitas o del alcohol.  La falta de actividad fsica. SNTOMAS  Los sntomas pueden variar y ser difciles de Hydrographic surveyor. Los sntomas pueden incluir:  Falta de aire al realizar actividades como subir escaleras.  Tos persistente.  Hinchazn de los pies, tobillos, piernas o abdomen.  Aumento de peso sin motivo.  Dificultad para respirar al estar acostado (ortopnea).  Despertarse con la necesidad de sentarse y tomar aire.  Latidos cardacos rpidos.  Fatiga y prdida de  Teacher, early years/pre.  Mareos, o sensacin de desmayo o desvanecimiento.  Prdida del apetito.  Nuseas.  Orina con ms frecuencia durante la noche (nocturia). DIAGNSTICO  El diagnstico se realiza segn la historia clnica, los sntomas, el examen fsico y las pruebas diagnsticas. Las pruebas diagnsticas son:  Lawyer.  Electrocardiograma.  Radiografa de trax.  Anlisis de [place].  Prueba de esfuerzo.  Angiografa cardaca.  Gammagrafa. TRATAMIENTO  El tratamiento est destinado al control de los sntomas. Podr ser necesario que tome medicamentos, realice cambios en su estilo de vida o se someta a una intervencin [place] para tratar la insuficiencia cardaca.  Los medicamentos para el tratamiento de la insuficiencia cardaca son:  Inhibidores de la enzima convertidora de angiotensina (IECA). Este tipo de [employer] bloquea los efectos de una protena llamada enzima convertidora de angiotensina. Los inhibidores de la ECA Engineer, agricultural (dilatan) los vasos sanguneos y ayudan a Equities trader la presin arterial.  Bloqueadores de los receptores de angiotensina [place]). Este tipo de medicamento bloquea las acciones de una protena de la sangre llamada angiotensina. Los bloqueadores de los receptores de angiotensina dilatan los vasos sanguneos y [place] a reducir la presin arterial.  Medicamentos para eliminar los lquidos (diurticos). Los diurticos [employer] los riones eliminen la sal y el agua de la [place]. El lquido en exceso es eliminado con la [place]. Esta eliminacin del exceso de lquido disminuye el volumen de sangre que el corazn bombea.  Betabloqueantes. Los betabloqueantes evitan que el corazn palpite demasiado rpido y mejoran la fuerza del msculo cardaco.  Digitlicos. Aumentan la fuerza de los latidos cardacos.  Los [employer] un estilo de vida saludable incluyen:  Science writer y Theatre manager un peso saludable.  Dejar de fumar o mascar tabaco.  Consumir alimentos  cardiosaludables.  Limitar o evitar el alcohol.  Dejar de consumir drogas ilcitas.  Hacer actividad fsica segn las indicaciones de su mdico.  Los tratamientos quirrgicos para la insuficiencia cardaca pueden ser:  Un procedimiento para abrir las arterias obstruidas, reparacin de vlvulas cardacas daadas o la extirpacin del tejido muscular cardaco daado.  La colocacin de un marcapasos para ayudar al funcionamiento del msculo cardaco y para controlar ciertos ritmos cardacos anormales.  Un desfibrilador cardioversor interno para tratar determinados ritmos cardacos graves anormales.  Un dispositivo de asistencia ventricular izquierda (DAVI) para ayudar a que el corazn [employer]. [place] los medicamentos solamente como se lo haya indicado el mdico. Los medicamentos son importantes para reducir la carga de trabajo del corazn, disminuir la progresin de la insuficiencia cardaca y Teacher, English as a foreign language los sntomas.  No deje de tomar los medicamentos, excepto si se lo indica su mdico.  No se saltee ninguna dosis de los medicamentos.  Pida una nueva receta antes de quedarse sin medicamentos. Es necesario que tome sus medicamentos todos [place].  Haga actividad fsica moderada si se lo indica su mdico. La actividad fsica moderada puede beneficiar a Psychologist, clinical. Los ancianos y las personas con insuficiencia cardaca grave deben consultarle a su mdico qu actividades fsicas son recomendables para ellos.  Consuma alimentos cardiosaludables. Los alimentos no deben incluir grasas trans y deben tener bajo contenido de grasas saturadas, colesterol y sal (sodio). Las opciones saludables incluyen frutas frescas o congeladas y verduras, pescado, carnes magras, legumbres, productos lcteos descremados o bajos en grasa y cereales integrales o alimentos con alto contenido de [place]. Hable con un nutricionista para aprender ms sobre los alimentos  cardiosaludables.  Limite el  sodio si se lo indica su mdico. La restriccin de sodio puede reducir los sntomas de insuficiencia cardaca en [employer]. Hable con un nutricionista para aprender ms sobre los condimentos cardiosaludables.  Use mtodos de coccin saludables. Los mtodos de coccin saludables incluyen asar, Software engineer, hervir, Development worker, community, cocer al vapor o Actor. Hable con un nutricionista para aprender ms acerca de los mtodos de coccin saludables.  Limite los lquidos si se lo indica su mdico. La restriccin de lquidos puede reducir los sntomas de insuficiencia cardaca en [employer].  Controle su peso a diario. Es importante que se pese todos los [address] para reconocer anticipadamente cuando hay exceso de lquido. Hgalo todas las maanas despus de orinar y antes de Engineer, maintenance. Use la misma ropa cada vez que se pese. Anote su [employer]. Lleve a su mdico el registro de [address].  Controle y registre su presin arterial si se lo indica su mdico.  Contrlese el pulso si se lo indica su mdico.  Pierda peso si se lo indica su mdico. La prdida de peso puede reducir los sntomas de insuficiencia cardaca en International aid/development worker.  Deje de fumar o mascar tabaco. La nicotina hace que el corazn trabaje ms y [employer] vasos sanguneos. No utilice chicles ni parches de nicotina antes de hablar con su mdico.  Concurra a todas las visitas de control como se lo haya indicado el mdico. Esto es importante.  Limite el consumo de alcohol a no ms de 1 medida por da en las mujeres no embarazadas y 2 medidas en los hombres. Una medida equivale a 12onzas de cerveza, 5onzas de vino o 1onzas de bebidas alcohlicas de alta graduacin. Beber ms puede ser daino para el corazn. Informe a su mdico si bebe alcohol varias veces a la semana. Hable con su mdico acerca de si el alcohol es seguro para usted. Si el corazn ya ha sufrido un dao por el alcohol o sufre una  insuficiencia cardaca grave, debe dejar de consumirlo completamente.  Deje de consumir drogas ilcitas.  Mantngase al da con las vacunas. Esto es especialmente importante prevenir las infecciones respiratorias con vacunas actualizadas contra el neumococo y la gripe.  Controle otros problemas de [place], como hipertensin, diabetes, enfermedad de la tiroides o ritmos cardacos anormales segn las indicaciones de su mdico.  Aprenda a Dealer.  Planifique perodos de descanso cuando se sienta fatigado.  Aprenda estrategias para manejar las altas temperaturas. Si el clima es extremadamente caluroso:  Evite la actividad fsica intensa.  Use el aire acondicionado o los ventiladores o busque un lugar ms fresco.  Evite la cafena y el alcohol.  Use ropa holgada, ligera y de colores claros.  Aprenda estrategias para manejar el fro. Si el clima es extremadamente fro:  Evite la actividad fsica intensa.  Vstase con varias capas de ropa.  Use mitones o guantes, un sombrero y [place] bufanda cuando salga.  Evite el alcohol.  Reciba asesoramiento y apoyo si lo necesita.  Busque programas de rehabilitacin y participe en ellos para mantener o mejorar su independencia y su calidad de vida. SOLICITE ATENCIN MDICA SI:   Aumenta de peso rpidamente.  Nota que le falta el aire y esto no es habitual en usted.  No puede participar en sus actividades fsicas habituales.  Se cansa con facilidad.  Tose ms de lo normal, especialmente al realizar actividad fsica.  Observa que sus manos, pies, tobillos o abdomen se le hinchan o estn ms hinchados que lo habitual.  Le cuesta dormir debido a que le resulta difcil respirar.  Siente que el corazn late rpido (palpitaciones).  Siente mareos o sensacin de desvanecimiento al ponerse de pie. SOLICITE ATENCIN MDICA DE INMEDIATO SI:   Tiene dificultad para respirar.  Hay una modificacin en el estado mental, como disminucin  de la lucidez o dificultad para concentrarse.  Siente dolor o molestias en el pecho.  Se desmaya una vez (sncope). ASEGRESE DE QUE:   Comprende estas instrucciones.  Controlar su afeccin.  Recibir ayuda de inmediato si no mejora o si empeora.   Esta informacin no tiene como fin reemplazar el consejo del mdico. Asegrese de hacerle al mdico cualquier pregunta que tenga.   Document Released: 11/17/2005 Document Revised: 04/03/2015 Elsevier Interactive Patient Education 2016 Elsevier Inc.  

## 2016-07-08 NOTE — Progress Notes (Signed)
Subjective:    Patient ID: Justin Sampson, male    DOB: Jun 02, 1956, 60 y.o.   MRN: [id number]  HPI Deantre Bourdon is a 60 year old male with a history of obesity, new diagnosis of CHF with EF of 25-30%, acute pulmonary embolism (on anticoagulation with Xarelto), hypertension, previously hospitalized for gluteal abscess which is followed by general surgery and wound dressing changes done by advanced home care nurse a couple of times a week and at other times by the patient himself.   He had a recent hospitalization for acute kidney injury thought to be secondary to overdiuresis with Lasix and NSAID use. His dose of Lasix was reduced from 80 mg twice daily to 40 mg twice daily and NSAID use was discouraged.  Scored  16/30 on PHQ 9At his last office visit but denies depression today and states he just feels fatigued due to his medical conditions. He reports feeling well and endorses some early morning cough productive of yellowish sputum but denies sore throat, sinus tenderness, fever or nasal congestion.  Past Medical History:  Diagnosis Date  . Cardiomyopathy, nonischemic (HCC) 06/17/2016  . Hypertension   . Obesities, morbid (HCC)   . Snoring     Past Surgical History:  Procedure Laterality Date  . CARDIAC CATHETERIZATION N/A 06/16/2016   Procedure: Left Heart Cath and Coronary Angiography;  Surgeon: Corky Crafts, MD;  Location: [facility] INVASIVE CV LAB;  Service: Cardiovascular;  Laterality: N/A;  . INCISION AND DRAINAGE ABSCESS Right 06/10/2016   Procedure: INCISION AND DRAINAGE GLUTEAL ABSCESS;  Surgeon: Manus Rudd, MD;  Location: [facility] OR;  Service: General;  Laterality: Right;    Current Outpatient Prescriptions on File Prior to Visit  Medication Sig Dispense Refill  . carvedilol (COREG) 3.125 MG tablet Take 1 tablet (3.125 mg total) by mouth 2 (two) times daily with a meal. 60 tablet 3  . furosemide (LASIX) 80 MG tablet Take 0.5 tablets (40 mg total) by mouth daily. 60 tablet 3  .  [START ON 07/12/2016] rivaroxaban (XARELTO) 20 MG TABS tablet Take 1 tablet (20 mg total) by mouth daily with breakfast. Start taking on 07/12/16 90 tablet 1   No current facility-administered medications on file prior to visit.       Review of Systems  Constitutional: Positive for fatigue. Negative for activity change and appetite change.  HENT: Negative for sinus pressure and sore throat.   Eyes: Negative for visual disturbance.  Respiratory: Positive for cough. Negative for chest tightness and shortness of breath.   Cardiovascular: Negative for chest pain and leg swelling.  Gastrointestinal: Negative for abdominal distention, abdominal pain, constipation and diarrhea.  Endocrine: Negative.   Genitourinary: Negative for dysuria.  Musculoskeletal: Negative for joint swelling and myalgias.  Skin: Negative for rash.  Allergic/Immunologic: Negative.   Neurological: Negative for weakness, light-headedness and numbness.  Psychiatric/Behavioral: Negative for dysphoric mood and suicidal ideas.       Objective: Vitals:   07/08/16 1118  BP: 104/70  Temp: 98.2 F (36.8 C)  TempSrc: Oral  SpO2: 97%  Weight: 252 lb (114.3 kg)  Height:  (1.676 m)      Physical Exam Constitutional: He is oriented to person, place, and time. He appears well-developed and well-nourished.  Obese  HENT: Normal tympanic membrane bilaterally, normal oropharynx  Cardiovascular: Normal rate, normal heart sounds and intact distal pulses.   No murmur heard. Pulmonary/Chest: Effort normal and breath sounds normal. He has no wheezes. He has no rales. He exhibits no tenderness.  Abdominal: Soft. Bowel sounds are normal. He exhibits no distension and no mass. There is no tenderness.  Musculoskeletal: Normal range of motion.  Neurological: He is alert and oriented to person, place, and time.  Skin:  Right gluteal abscess carvity  Psychiatric: He has a normal mood and affect.        CMP Latest Ref Rng &  Units 07/04/2016 07/03/2016 07/02/2016  Glucose 65 - 99 mg/dL 85 81 79  BUN 6 - 20 mg/dL 10 15 83(T)  Creatinine 0.61 - 1.24 mg/dL 5.17(O) 1.60(V) 3.71(G)  Sodium 135 - 145 mmol/L 139 139 138  Potassium 3.5 - 5.1 mmol/L 3.4(L) 3.7 4.1  Chloride 101 - 111 mmol/L 107 108 108  CO2 22 - 32 mmol/L 23 22 18(L)  Calcium 8.9 - 10.3 mg/dL 9.1 9.2 9.3  Total Protein 6.5 - 8.1 g/dL - - -  Total Bilirubin 0.3 - 1.2 mg/dL - - -  Alkaline Phos 38 - 126 U/L - - -  AST 15 - 41 U/L - - -  ALT 17 - 63 U/L - - -    Assessment & Plan:  1. Gluteal abscess Dressing change performed in the clinic Closely followed by [place] [facility] Advanced home care RN to see patient   2. Essential hypertension BPs on the soft side Reduced dose of carvedilol  3. Acute on chronic systolic CHF (congestive heart failure) (HCC) EF 25 -30% Euvolemic Fatigue could be secondary to deconditioning- we'll check thyroid labs if fatigue persists - carvedilol (COREG) 3.125 MG tablet; Take 1 tablet (3.125 mg total) by mouth 2 (two) times daily with a meal.  Dispense: 60 tablet; Refill: 3  4. Obesity hypoventilation syndrome (HCC) Possible underlying sleep apnea Scheduled for sleep study later month  5. Other acute pulmonary embolism without acute cor pulmonale (HCC) Will need to be anticoagulated until 12/20/16;  prescription for Xarelto sent to pharmacy in house to assist with patient assistance program - rivaroxaban (XARELTO) 20 MG TABS tablet; Take 1 tablet (20 mg total) by mouth daily with breakfast. Start taking on 07/12/16  Dispense: 90 tablet; Refill: 1  6. Acute Renal failure CMET today Continue reduced dose of Lasix, avoid NSAIDs and nephrotoxic agents. Scored  16/30 on PHQ 9- will address depression at next visit

## 2016-07-09 ENCOUNTER — Other Ambulatory Visit: Payer: Self-pay | Admitting: Family Medicine

## 2016-07-09 ENCOUNTER — Encounter: Payer: Self-pay | Admitting: Physician Assistant

## 2016-07-09 ENCOUNTER — Telehealth: Payer: Self-pay

## 2016-07-09 DIAGNOSIS — E876 Hypokalemia: Secondary | ICD-10-CM

## 2016-07-09 LAB — COMPLETE METABOLIC PANEL WITH GFR
ALK PHOS: 64 U/L (ref 40–115)
ALT: 10 U/L (ref 9–46)
AST: 20 U/L (ref 10–35)
Albumin: 3.6 g/dL (ref 3.6–5.1)
BUN: 8 mg/dL (ref 7–25)
CALCIUM: 9.3 mg/dL (ref 8.6–10.3)
CO2: 20 mmol/L (ref 20–31)
CREATININE: 1.24 mg/dL (ref 0.70–1.33)
Chloride: 98 mmol/L (ref 98–110)
GFR, Est African American: 73 mL/min (ref >=60)
GFR, Est Non African American: 63 mL/min (ref >=60)
Glucose, Bld: 103 mg/dL — ABNORMAL HIGH (ref 65–99)
POTASSIUM: 3.1 mmol/L — AB (ref 3.5–5.3)
Sodium: 131 mmol/L — ABNORMAL LOW (ref 135–146)
Total Bilirubin: 1.2 mg/dL (ref 0.2–1.2)
Total Protein: 6.8 g/dL (ref 6.1–8.1)

## 2016-07-09 MED ORDER — POTASSIUM CHLORIDE CRYS ER 20 MEQ PO TBCR: 20.0000 meq | EXTENDED_RELEASE_TABLET | Freq: Every day | ORAL | 3 refills | Status: DC

## 2016-07-09 NOTE — Telephone Encounter (Signed)
[employer] Justin Sampson [id number] contacted pt to go over lab results pt is aware of results and rx sent to the pharmacy

## 2016-07-09 NOTE — Progress Notes (Signed)
Cardiology Office Note:    Date:  07/10/2016   ID:  Justin Sampson, DOB [birth year], MRN [id number]  PCP:  Jaclyn Shaggy, MD  Cardiologist:  Dr. Dietrich Pates   Electrophysiologist:  n/a  Referring MD: No ref. provider found   Chief Complaint  Patient presents with  . Hospitalization Follow-up    CHF    History of Present Illness:    Justin Sampson is a 60 y.o. male with a hx of HTN.  Admitted 7/11-7/23 with a right gluteal abscess requiring I&D, ARF, acute systolic/diastolic CHF, acute R pulmonary embolism. He was evaluated by cardiology. EF was 35-40% by echocardiogram with mild diastolic dysfunction. Cardiac catheterization demonstrated no significant CAD. Repeat echo demonstrated EF 25-30%. This was felt to represent a nonischemic cardiomyopathy. He was placed on Xarelto for anticoagulation. Of note, he was suspected to have OSA and recommendation at discharge was to proceed with outpatient sleep study.  Readmitted 7/31-8/4 with acute renal failure. Creatinine was 7.16 on admission. This improved to 1.62 at discharge. Lisinopril was held at discharge. Furosemide is resumed at discharge.  Here for FU.  He is here today with a friend who helps interpret. He is doing well since discharge from the hospital. He denies chest discomfort. He denies significant dyspnea. He notes that he has had follow-up with general surgery. His abscess is healing. He denies orthopnea or PND. Denies pedal edema. Denies syncope. He was placed on potassium for hypokalemia. He notes GI upset when he takes these pills. He switches over from Xarelto 15 mg twice a day to Xarelto 20 mg daily later this week.  Prior CV studies that were reviewed today include:    Echo 06/20/16 Mild LVH, EF 25-30%, diffuse HK, grade 1 diastolic dysfunction, MAC, mild LAE  LHC 06/16/16  There is moderate to severe left ventricular systolic dysfunction.  No signficant CAD.  Mildly elevated LVEDP. Nonischemic cardiomyopathy.  Continue  medical therapy.  I also recommended weight loss to him.    Echo 06/12/16 EF 35-40%, lateral and inferior HK, moderate LAE   Past Medical History:  Diagnosis Date  . Acute pulmonary embolism (HCC) 06/26/2016  . Cardiomyopathy, nonischemic (HCC) 06/17/2016   a. LHC 7/17: no CAD  . Chronic combined systolic and diastolic CHF (congestive heart failure) (HCC)    A. Echo 06/12/16:  EF 35-40%, lateral and inferior HK, moderate LAE  //  B.  Echo 06/20/16:  Mild LVH, EF 25-30%, diffuse HK, grade 1 diastolic dysfunction, MAC, mild LAE  . History of acute renal failure 06/30/2016  . Hypertension   . Obesities, morbid (HCC)   . Snoring     Past Surgical History:  Procedure Laterality Date  . CARDIAC CATHETERIZATION N/A 06/16/2016   Procedure: Left Heart Cath and Coronary Angiography;  Surgeon: Corky Crafts, MD;  Location: [facility] INVASIVE CV LAB;  Service: Cardiovascular;  Laterality: N/A;  . INCISION AND DRAINAGE ABSCESS Right 06/10/2016   Procedure: INCISION AND DRAINAGE GLUTEAL ABSCESS;  Surgeon: Manus Rudd, MD;  Location: [facility] OR;  Service: General;  Laterality: Right;    Current Medications: Outpatient Medications Prior to Visit  Medication Sig Dispense Refill  . [START ON 07/12/2016] rivaroxaban (XARELTO) 20 MG TABS tablet Take 1 tablet (20 mg total) by mouth daily with breakfast. Start taking on 07/12/16 90 tablet 1  . carvedilol (COREG) 3.125 MG tablet Take 1 tablet (3.125 mg total) by mouth 2 (two) times daily with a meal. 60 tablet 3  . furosemide (LASIX) 80 MG tablet  Take 0.5 tablets (40 mg total) by mouth daily. 60 tablet 3  . potassium chloride SA (K-DUR,KLOR-CON) 20 MEQ tablet Take 1 tablet (20 mEq total) by mouth daily. 30 tablet 3   No facility-administered medications prior to visit.       Allergies:   Review of patient's allergies indicates no known allergies.   Social History   Social History  . Marital status: Single    Spouse name: N/A  . Number of children: N/A  .  Years of education: N/A   Social History Main Topics  . Smoking status: Never Smoker  . Smokeless tobacco: Never Used  . Alcohol use No     Comment: not had any alcohol in 6 months  . Drug use: No  . Sexual activity: Not Asked   Other Topics Concern  . None   Social History Narrative  . None     Family History:  The patient's family history includes Diabetes in his mother.   ROS:   Please see the history of present illness.    Review of Systems  Respiratory: Positive for snoring.   Musculoskeletal: Positive for myalgias.  Neurological: Positive for loss of balance.   All other systems reviewed and are negative.   EKGs/Labs/Other Test Reviewed:    EKG:  EKG is  ordered today.  The ekg ordered today demonstratesNSR, HR 70, normal axis, septal Q waves, PACs, PVCs, QTc 434 ms   Recent Labs: 06/13/2016: TSH 0.581 06/18/2016: Magnesium 1.8 06/30/2016: B Natriuretic Peptide 21.4 07/04/2016: Hemoglobin 12.4; Platelets 266 07/08/2016: ALT 10; BUN 8; Creat 1.24; Potassium 3.1; Sodium 131   Recent Lipid Panel    Component Value Date/Time   CHOL 109 06/14/2016 0549   TRIG 76 06/14/2016 0549   HDL 31 (L) 06/14/2016 0549   CHOLHDL 3.5 06/14/2016 0549   VLDL 15 06/14/2016 0549   LDLCALC 63 06/14/2016 0549     Physical Exam:    VS:  BP 118/80   Pulse 70   Ht  (1.676 m)   Wt 249 lb (112.9 kg)   BMI 40.19 kg/m     Wt Readings from Last 3 Encounters:  07/10/16 249 lb (112.9 kg)  07/08/16 252 lb (114.3 kg)  07/03/16 260 lb 8 oz (118.2 kg)     Physical Exam  Constitutional: He is oriented to person, place, and time. He appears well-developed and well-nourished. No distress.  HENT:  Head: Normocephalic and atraumatic.  Neck: No JVD present.  Cardiovascular: Normal rate, regular rhythm and normal heart sounds.   No murmur heard. Pulmonary/Chest: Effort normal and breath sounds normal. He has no wheezes. He has no rales.  Abdominal: Soft. There is no tenderness.    Musculoskeletal: He exhibits no edema.  right wrist without hematoma or mass   Neurological: He is alert and oriented to person, place, and time.  Skin: Skin is warm and dry.  Psychiatric: He has a normal mood and affect.    ASSESSMENT:    1. Chronic combined systolic and diastolic CHF (congestive heart failure) (HCC)   2. Cardiomyopathy, nonischemic (HCC)   3. Essential hypertension   4. Other acute pulmonary embolism without acute cor pulmonale (HCC)   5. Acute renal failure, unspecified acute renal failure type (HCC)   6. Hypokalemia    PLAN:    In order of problems listed above:  1.Chronic combined systolic and diastolic CHF - He was recently admitted with gluteal abscess with fistula that required I&D as well as  acute heart failure and acute pulmonary embolism. He was readmitted with acute renal failure. He is now off of ACE inhibitor. He is back on Lasix. Recent creatinine improved. His potassium is low. He still arrives in a wheelchair. However, his volume appears to be stable. He denies significant dyspnea. Continue beta blocker, diuretic. We will need to eventually resume his ACE inhibitor if his renal function will allow. Otherwise consider hydralazine and nitrates. He is having difficulty taking potassium pills. With his recent episode of acute renal failure, I'm hesitant to place him on spironolactone. At some point, he will need repeat echocardiogram.  -  Continue current therapy with beta-blocker, diuretic  -  Repeat BMET 1 week  2. Nonischemic cardiomyopathy - Continue close follow-up and adjustment of medications. Plan repeat echo at some point in the next 3-6 months to reassess LVEF.  3. HTN - Blood pressure is controlled.  4. Acute pulmonary embolism - No evidence of RV failure or cor pulmonale.  He continues on anticoagulation. He will likely require Xarelto for 6-12 months. At this point, it appears that his pulmonary emobolism was provoked by  surgery/hospitalization.  5. Acute renal failure - His creatinine continues to improve. I will try to switch him to potassium elixir and recheck a BMET in one week.  6. Hypokalemia - If he cannot tolerate potassium elixir, consider changing him over to spironolactone.   Medication Adjustments/Labs and Tests Ordered: Current medicines are reviewed at length with the patient today.  Concerns regarding medicines are outlined above.  Medication changes, Labs and Tests ordered today are outlined in the Patient Instructions noted below. Patient Instructions  Medication Instructions:  Your physician has recommended you make the following change in your medication:  1-STOP Potassium tablet  2-TAKE Potassium solution /15 ml by mouth daily Labwork: Your physician recommends that you return for lab work in: 1 week for BMET Testing/Procedures: NONE Follow-Up: Your physician recommends that you schedule a follow-up appointment in: 2 to 3 weeks with Tereso Newcomer PA If you need a refill on your cardiac medications before your next appointment, please call your pharmacy.  Signed, Tereso Newcomer, PA-C  07/10/2016 9:47 AM    [facility] [address] [place], [place], [place]  [address] Phone: [phone]; Fax: [phone]

## 2016-07-10 ENCOUNTER — Encounter: Payer: Self-pay | Admitting: Physician Assistant

## 2016-07-10 ENCOUNTER — Ambulatory Visit (INDEPENDENT_AMBULATORY_CARE_PROVIDER_SITE_OTHER): Payer: Self-pay | Admitting: Physician Assistant

## 2016-07-10 VITALS — BP 118/80 | HR 70 | Ht 66.0 in | Wt 249.0 lb

## 2016-07-10 DIAGNOSIS — I429 Cardiomyopathy, unspecified: Secondary | ICD-10-CM

## 2016-07-10 DIAGNOSIS — I10 Essential (primary) hypertension: Secondary | ICD-10-CM

## 2016-07-10 DIAGNOSIS — I5042 Chronic combined systolic (congestive) and diastolic (congestive) heart failure: Secondary | ICD-10-CM

## 2016-07-10 DIAGNOSIS — E876 Hypokalemia: Secondary | ICD-10-CM

## 2016-07-10 DIAGNOSIS — N179 Acute kidney failure, unspecified: Secondary | ICD-10-CM

## 2016-07-10 DIAGNOSIS — I2699 Other pulmonary embolism without acute cor pulmonale: Secondary | ICD-10-CM

## 2016-07-10 DIAGNOSIS — I428 Other cardiomyopathies: Secondary | ICD-10-CM

## 2016-07-10 MED ORDER — CARVEDILOL 3.125 MG PO TABS: 3.1250 mg | ORAL_TABLET | Freq: Two times a day (BID) | ORAL | 3 refills | Status: AC

## 2016-07-10 MED ORDER — POTASSIUM CHLORIDE 20 MEQ/15ML (10%) PO SOLN: 20.0000 meq | Freq: Every day | ORAL | 6 refills | Status: DC

## 2016-07-10 MED ORDER — FUROSEMIDE 80 MG PO TABS: 40.0000 mg | ORAL_TABLET | Freq: Every day | ORAL | 3 refills | Status: AC

## 2016-07-10 NOTE — Patient Instructions (Addendum)
Medication Instructions:  Your physician has recommended you make the following change in your medication:  1-STOP Potassium tablet  2-TAKE Potassium solution /15 ml by mouth daily Labwork: Your physician recommends that you return for lab work in: 1 week for BMET Testing/Procedures: NONE Follow-Up: Your physician recommends that you schedule a follow-up appointment in: 2 to 3 weeks with Tereso Newcomer PA If you need a refill on your cardiac medications before your next appointment, please call your pharmacy.

## 2016-07-17 ENCOUNTER — Telehealth: Payer: Self-pay | Admitting: *Deleted

## 2016-07-17 ENCOUNTER — Other Ambulatory Visit: Payer: Self-pay

## 2016-07-17 ENCOUNTER — Other Ambulatory Visit: Payer: Self-pay | Admitting: *Deleted

## 2016-07-17 DIAGNOSIS — I2699 Other pulmonary embolism without acute cor pulmonale: Secondary | ICD-10-CM

## 2016-07-17 DIAGNOSIS — I428 Other cardiomyopathies: Secondary | ICD-10-CM

## 2016-07-17 DIAGNOSIS — I10 Essential (primary) hypertension: Secondary | ICD-10-CM

## 2016-07-17 DIAGNOSIS — E876 Hypokalemia: Secondary | ICD-10-CM

## 2016-07-17 LAB — BASIC METABOLIC PANEL
BUN: 10 mg/dL (ref 7–25)
CO2: 20 mmol/L (ref 20–31)
Calcium: 9.3 mg/dL (ref 8.6–10.3)
Chloride: 94 mmol/L — ABNORMAL LOW (ref 98–110)
Creat: 1.09 mg/dL (ref 0.70–1.33)
Glucose, Bld: 108 mg/dL — ABNORMAL HIGH (ref 65–99)
POTASSIUM: 2.9 mmol/L — AB (ref 3.5–5.3)
Sodium: 131 mmol/L — ABNORMAL LOW (ref 135–146)

## 2016-07-17 MED ORDER — RIVAROXABAN 20 MG PO TABS: 20.0000 mg | ORAL_TABLET | Freq: Every day | ORAL | 3 refills | Status: DC

## 2016-07-17 MED ORDER — POTASSIUM CHLORIDE 20 MEQ/15ML (10%) PO SOLN: 40.0000 meq | Freq: Every day | ORAL | 6 refills | Status: DC

## 2016-07-17 NOTE — Telephone Encounter (Signed)
Due to language barrier; pt put his friend Will on the phone who speaks [place]. Will advised per Dr. Clifton James to increase K+ to 60 meq daily x 3 days then change K+ to 40 meq daily. BMET next week; pt wants to ask his nurse what can she bring him in, either Thursday 8/24 or Friday 8/25. Will states he will call me back tomorrow to let me know what day pt will come in for lab work. I did also advise pt's friend Will to remind pt to keep his appt 07/28/16 with Bing Neighbors. PAC. Will verbalized understanding to plan of care and all instructions.

## 2016-07-18 ENCOUNTER — Telehealth: Payer: Self-pay | Admitting: *Deleted

## 2016-07-18 NOTE — Telephone Encounter (Signed)
Pt had me s/w his friend Will in regards to lab appt scheduled for 07/24/16. Advised keep [employer]. PA appt 8/28. Will verbalized understanding to plan of care for the pt.

## 2016-07-22 ENCOUNTER — Telehealth: Payer: Self-pay | Admitting: Family Medicine

## 2016-07-22 NOTE — Telephone Encounter (Signed)
Called Jennet Maduro back from [facility] who wanted to know besides them who else is following patient for his gluteal abscess. Per Dr. Jen Mow notes [facility] surgery is following patient for this abscess.  RN stated understanding.

## 2016-07-22 NOTE — Telephone Encounter (Signed)
Delia Heady would like to know if anyone has followed up on the patients abscess.   Please follow up.

## 2016-07-24 ENCOUNTER — Telehealth: Payer: Self-pay | Admitting: *Deleted

## 2016-07-24 ENCOUNTER — Other Ambulatory Visit: Payer: Self-pay | Admitting: *Deleted

## 2016-07-24 DIAGNOSIS — I10 Essential (primary) hypertension: Secondary | ICD-10-CM

## 2016-07-24 DIAGNOSIS — E876 Hypokalemia: Secondary | ICD-10-CM

## 2016-07-24 DIAGNOSIS — I5042 Chronic combined systolic (congestive) and diastolic (congestive) heart failure: Secondary | ICD-10-CM

## 2016-07-24 LAB — BASIC METABOLIC PANEL
BUN: 7 mg/dL (ref 7–25)
CHLORIDE: 99 mmol/L (ref 98–110)
CO2: 22 mmol/L (ref 20–31)
Calcium: 9.5 mg/dL (ref 8.6–10.3)
Creat: 0.89 mg/dL (ref 0.70–1.33)
GLUCOSE: 96 mg/dL (ref 65–99)
POTASSIUM: 3.9 mmol/L (ref 3.5–5.3)
SODIUM: 134 mmol/L — AB (ref 135–146)

## 2016-07-24 MED ORDER — LISINOPRIL 2.5 MG PO TABS: 2.5000 mg | ORAL_TABLET | Freq: Every day | ORAL | 3 refills | Status: DC

## 2016-07-24 MED FILL — LISINOPRIL 2.5 MG TABLET: 2.5 | 30 days supply | Qty: 30 | Fill #0

## 2016-07-24 NOTE — Telephone Encounter (Signed)
Pt has given ok to s/w his friend Will. Will has been notified of lab results and recommendations to have pt start Lisinopril 2.5 mg daily; Rx sent to [facility]. BMET 5 days after start of Lisinopril. Advised keep appt 8/28 w/Scott W. PA 10:45.

## 2016-07-27 NOTE — Progress Notes (Signed)
Cardiology Office Note:    Date:  07/28/2016   ID:  Justin Sampson, DOB 08-Feb-1956, MRN [id number]  PCP:  Jaclyn ShaggyEnobong, Amao, MD  Cardiologist:  Dr. Dietrich PatesPaula Ross   Electrophysiologist:  n/a  Referring MD: Jaclyn ShaggyAmao, Enobong, MD   Chief Complaint  Patient presents with  . Follow-up    CHF    History of Present Illness:    Justin SpenceJorge Sampson is a 60 y.o. male with a hx of HTN, combined systolic and diastolic CHF 2/2 NICM, pulmonary embolism.  He was admitted in 7/17 with a right gluteal abscess requiring I&D c/b ARF, acute systolic/diastolic CHF, acute R pulmonary embolism. Echo demonstrated EF 25-30 %, mild diastolic dysfunction. LHC demonstrated no significant CAD.  He was placed on Xarelto for anticoagulation. Of note, he was suspected to have OSA and outpatient sleep study was recommended.   He was re-admitted in 8/17 with acute renal failure (SCr 7.16 on admission). His ACE inhibitor was stopped.   I saw him in FU 07/10/16.  Recent labs have demonstrated normal renal function.  I recently recommended that he resume his ACE inhibitor.  He returns for close FU.    He returns again with his friend. She helps with interpretation. The patient is doing well. He has not yet started on lisinopril. He picks up the medications today from the pharmacy. He denies chest discomfort. Denies significant dyspnea. He denies edema. He denies syncope, orthopnea, PND.  Prior CV studies that were reviewed today include:    Echo 06/20/16 Mild LVH, EF 25-30%, diffuse HK, grade 1 diastolic dysfunction, MAC, mild LAE  LHC 06/16/16 There is moderate to severe left ventricular systolic dysfunction. No signficant CAD. Mildly elevated LVEDP. Nonischemic cardiomyopathy. Continue medical therapy. I also recommended weight loss to him.   Echo 06/12/16 EF 35-40%, lateral and inferior HK, moderate LAE   Past Medical History:  Diagnosis Date  . Acute pulmonary embolism (HCC) 06/26/2016  . Cardiomyopathy, nonischemic (HCC)  06/17/2016   a. LHC 7/17: no CAD  . Chronic combined systolic and diastolic CHF (congestive heart failure) (HCC)    A. Echo 06/12/16:  EF 35-40%, lateral and inferior HK, moderate LAE  //  B.  Echo 06/20/16:  Mild LVH, EF 25-30%, diffuse HK, grade 1 diastolic dysfunction, MAC, mild LAE  . History of acute renal failure 06/30/2016  . Hypertension   . Obesities, morbid (HCC)   . Snoring     Past Surgical History:  Procedure Laterality Date  . CARDIAC CATHETERIZATION N/A 06/16/2016   Procedure: Left Heart Cath and Coronary Angiography;  Surgeon: Corky CraftsJayadeep S Varanasi, MD;  [facility];  Service: Cardiovascular;  Laterality: N/A;  . INCISION AND DRAINAGE ABSCESS Right 06/10/2016   Procedure: INCISION AND DRAINAGE GLUTEAL ABSCESS;  Surgeon: Manus RuddMatthew Tsuei, MD;  Location: [facility] OR;  Service: General;  Laterality: Right;    Current Medications: Outpatient Medications Prior to Visit  Medication Sig Dispense Refill  . carvedilol (COREG) 3.125 MG tablet Take 1 tablet (3.125 mg total) by mouth 2 (two) times daily with a meal. 180 tablet 3  . furosemide (LASIX) 80 MG tablet Take 0.5 tablets (40 mg total) by mouth daily. 90 tablet 3  . rivaroxaban (XARELTO) 20 MG TABS tablet Take 1 tablet (20 mg total) by mouth daily with breakfast. Start taking on 07/12/16 90 tablet 3  . potassium chloride 20 MEQ/15ML (10%) SOLN Take 30 mLs (40 mEq total) by mouth daily. 900 mL 6  . lisinopril (PRINIVIL,ZESTRIL) 2.5 MG tablet  Take 1 tablet (2.5 mg total) by mouth daily. 90 tablet 3   No facility-administered medications prior to visit.       Allergies:   Review of patient's allergies indicates no known allergies.   Social History   Social History  . Marital status: Single    Spouse name: N/A  . Number of children: N/A  . Years of education: N/A   Social History Main Topics  . Smoking status: Never Smoker  . Smokeless tobacco: Never Used  . Alcohol use No     Comment: not had any alcohol in 6  months  . Drug use: No  . Sexual activity: Not Asked   Other Topics Concern  . None   Social History Narrative  . None     Family History:  The patient's family history includes Diabetes in his mother.   ROS:   Please see the history of present illness.    Review of Systems  Constitution: Positive for chills.   All other systems reviewed and are negative.   EKGs/Labs/Other Test Reviewed:    EKG:  EKG is not ordered today.  The ekg ordered today demonstrates n/a  Recent Labs: 06/13/2016: TSH 0.581 06/18/2016: Magnesium 1.8 06/30/2016: B Natriuretic Peptide 21.4 07/04/2016: Hemoglobin 12.4; Platelets 266 07/08/2016: ALT 10 07/24/2016: BUN 7; Creat 0.89; Potassium 3.9; Sodium 134   Recent Lipid Panel    Component Value Date/Time   CHOL 109 06/14/2016 0549   TRIG 76 06/14/2016 0549   HDL 31 (L) 06/14/2016 0549   CHOLHDL 3.5 06/14/2016 0549   VLDL 15 06/14/2016 0549   LDLCALC 63 06/14/2016 0549     Physical Exam:    VS:  BP 106/70   Pulse 60   Ht 5\' 6"  (1.676 m)   Wt 242 lb 6.4 oz (110 kg)   BMI 39.12 kg/m     Wt Readings from Last 3 Encounters:  07/28/16 242 lb 6.4 oz (110 kg)  07/10/16 249 lb (112.9 kg)  07/08/16 252 lb (114.3 kg)     Physical Exam  Constitutional: He is oriented to person, place, and time. He appears well-developed and well-nourished. No distress.  HENT:  Head: Normocephalic and atraumatic.  Eyes: No scleral icterus.  Neck: No JVD present.  Cardiovascular: Normal rate, regular rhythm and normal heart sounds.   No murmur heard. Pulmonary/Chest: Effort normal. He has no wheezes. He has no rales.  Abdominal: Soft. There is no tenderness.  Musculoskeletal: He exhibits no edema.  Neurological: He is alert and oriented to person, place, and time.  Skin: Skin is warm and dry.  Psychiatric: He has a normal mood and affect.    ASSESSMENT:    1. Chronic combined systolic and diastolic CHF (congestive heart failure) (HCC)   2. Cardiomyopathy,  nonischemic (HCC)   3. Essential hypertension   4. History of pulmonary embolism    PLAN:    In order of problems listed above:  1. Chronic combined systolic and diastolic CHF - He was admitted in 7/17 with gluteal abscess with fistula that required I&D.  Hospitalization was complicated by acute heart failure and acute pulmonary embolism. He was then readmitted with acute renal failure.  He is currently stable from a volume standpoint.  Continue current dose of Furosemide, Carvedilol.  He will start Lisinopril 2.5 mg QD today. Check BMET in 1 week.  FU with me in 1 month.   2. Nonischemic cardiomyopathy -  Plan repeat echo at some point in the next  3-6 months to reassess LVEF.  If EF remains < 35%, he will need evaluation with EP to determine +/- ICD implantation.    3. HTN - Blood pressure is controlled.  4. Hx of pulmonary embolism - This occurred in the context of hospitalization for gluteal abscess and CHF.  There was no evidence of RV failure or cor pulmonale on Echo.  He continues on anticoagulation and will likely require Xarelto for 6-12 months.    Medication Adjustments/Labs and Tests Ordered: Current medicines are reviewed at length with the patient today.  Concerns regarding medicines are outlined above.  Medication changes, Labs and Tests ordered today are outlined in the Patient Instructions noted below. Patient Instructions  Medication Instructions:  Please get the Lisinopril 2.5 mg from the [facility] today and START taking it once daily. Labwork: In 1 week - BMET Testing/Procedures: None  Follow-Up: Tereso Newcomer, PA-C on 08/29/16 @ 9:45 Any Other Special Instructions Will Be Listed Below (If Applicable). If you need a refill on your cardiac medications before your next appointment, please call your pharmacy.   Signed, Tereso Newcomer, PA-C  07/28/2016 11:24 AM    [facility] [address] [place], [place],  [place]  [address] Phone: [phone]; Fax: [phone]

## 2016-07-28 ENCOUNTER — Encounter (INDEPENDENT_AMBULATORY_CARE_PROVIDER_SITE_OTHER): Payer: Self-pay

## 2016-07-28 ENCOUNTER — Encounter: Payer: Self-pay | Admitting: Physician Assistant

## 2016-07-28 ENCOUNTER — Ambulatory Visit (INDEPENDENT_AMBULATORY_CARE_PROVIDER_SITE_OTHER): Payer: Self-pay | Admitting: Physician Assistant

## 2016-07-28 VITALS — BP 106/70 | HR 60 | Ht 66.0 in | Wt 242.4 lb

## 2016-07-28 DIAGNOSIS — I428 Other cardiomyopathies: Secondary | ICD-10-CM

## 2016-07-28 DIAGNOSIS — I10 Essential (primary) hypertension: Secondary | ICD-10-CM

## 2016-07-28 DIAGNOSIS — I5042 Chronic combined systolic (congestive) and diastolic (congestive) heart failure: Secondary | ICD-10-CM

## 2016-07-28 DIAGNOSIS — Z86711 Personal history of pulmonary embolism: Secondary | ICD-10-CM

## 2016-07-28 DIAGNOSIS — I429 Cardiomyopathy, unspecified: Secondary | ICD-10-CM

## 2016-07-28 MED ORDER — POTASSIUM CHLORIDE 20 MEQ PO PACK: 40.0000 meq | PACK | Freq: Every day | ORAL | 3 refills | Status: AC

## 2016-07-28 MED ORDER — LISINOPRIL 2.5 MG PO TABS: 2.5000 mg | ORAL_TABLET | Freq: Every day | ORAL | 3 refills | Status: AC

## 2016-07-28 MED FILL — POTASSIUM CL ER 20 MEQ TAB: 20 | 30 days supply | Qty: 60 | Fill #0

## 2016-07-28 NOTE — Patient Instructions (Addendum)
Medication Instructions:  Please get the Lisinopril 2.5 mg from the [facility] today and START taking it once daily. Labwork: In 1 week - BMET Testing/Procedures: None  Follow-Up: Tereso Newcomer, PA-C on 08/29/16 @ 9:45 Any Other Special Instructions Will Be Listed Below (If Applicable). If you need a refill on your cardiac medications before your next appointment, please call your pharmacy.

## 2016-07-29 ENCOUNTER — Ambulatory Visit (HOSPITAL_BASED_OUTPATIENT_CLINIC_OR_DEPARTMENT_OTHER): Payer: Self-pay | Attending: Internal Medicine | Admitting: Internal Medicine

## 2016-07-29 VITALS — Ht 66.0 in | Wt 249.0 lb

## 2016-07-29 DIAGNOSIS — I493 Ventricular premature depolarization: Secondary | ICD-10-CM | POA: Insufficient documentation

## 2016-07-29 DIAGNOSIS — G4733 Obstructive sleep apnea (adult) (pediatric): Secondary | ICD-10-CM | POA: Insufficient documentation

## 2016-07-29 DIAGNOSIS — G4730 Sleep apnea, unspecified: Secondary | ICD-10-CM

## 2016-08-05 ENCOUNTER — Other Ambulatory Visit: Payer: Self-pay | Admitting: *Deleted

## 2016-08-05 DIAGNOSIS — I5042 Chronic combined systolic (congestive) and diastolic (congestive) heart failure: Secondary | ICD-10-CM

## 2016-08-05 LAB — BASIC METABOLIC PANEL
BUN: 7 mg/dL (ref 7–25)
CALCIUM: 9.8 mg/dL (ref 8.6–10.3)
CO2: 21 mmol/L (ref 20–31)
CREATININE: 0.79 mg/dL (ref 0.70–1.33)
Chloride: 102 mmol/L (ref 98–110)
Glucose, Bld: 93 mg/dL (ref 65–99)
Potassium: 3.5 mmol/L (ref 3.5–5.3)
SODIUM: 135 mmol/L (ref 135–146)

## 2016-08-05 MED FILL — XARELTO 20 MG TABLET: 20 | 30 days supply | Qty: 30 | Fill #1

## 2016-08-05 MED FILL — CARVEDILOL 3.125 MG TABLET: 3.125 | 30 days supply | Qty: 60 | Fill #0

## 2016-08-05 NOTE — Progress Notes (Deleted)
Cardiology Office Note:    Date:  08/05/2016   ID:  Justin Sampson, DOB 01-Aug-1956, MRN [id number]  PCP:  Jaclyn Shaggy, MD  Cardiologist:  Dr. Dietrich Pates   Electrophysiologist:  n/a  Referring MD: Jaclyn Shaggy, MD   No chief complaint on file. ???  History of Present Illness:    Justin Sampson is a 60 y.o. 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  He was hospitalized in 7/17 with a right gluteal abscess requiring I&D c/b ARF,acute systolic/diastolic CHF, acute R pulmonary embolism. Echo demonstrated EF 25-30 %, mild diastolic dysfunction. LHC demonstrated no significant CAD.  He is on Xarelto for anticoagulation. Of note, he was suspected to have OSA and outpatient sleep study was recommended.   He was re-admitted in 8/17 with acute renal failure (SCr 7.16 on admission). His ACE inhibitor was stopped.   Last seen by me 07/28/16. ***  Prior CV studies that were reviewed today include:    Echo 06/20/16 Mild LVH, EF 25-30%, diffuse HK, grade 1 diastolic dysfunction, MAC, mild LAE  LHC 06/16/16 There is moderate to severe left ventricular systolic dysfunction. No signficant CAD. Mildly elevated LVEDP. Nonischemic cardiomyopathy. Continue medical therapy. I also recommended weight loss to him.   Echo 06/12/16 EF 35-40%, lateral and inferior HK, moderate LAE   Past Medical History:  Diagnosis Date  . Acute pulmonary embolism (HCC) 06/26/2016  . Cardiomyopathy, nonischemic (HCC) 06/17/2016   a. LHC 7/17: no CAD  . Chronic combined systolic and diastolic CHF (congestive heart failure) (HCC)    A. Echo 06/12/16:  EF 35-40%, lateral and inferior HK, moderate LAE  //  B.  Echo 06/20/16:  Mild LVH, EF 25-30%, diffuse HK, grade 1 diastolic dysfunction, MAC, mild LAE  . History of acute renal failure 06/30/2016  . Hypertension   . Obesities, morbid (HCC)   . Snoring     Past Surgical History:  Procedure  Laterality Date  . CARDIAC CATHETERIZATION N/A 06/16/2016   Procedure: Left Heart Cath and Coronary Angiography;  Surgeon: Corky Crafts, MD;  Location: [facility] INVASIVE CV LAB;  Service: Cardiovascular;  Laterality: N/A;  . INCISION AND DRAINAGE ABSCESS Right 06/10/2016   Procedure: INCISION AND DRAINAGE GLUTEAL ABSCESS;  Surgeon: Manus Rudd, MD;  Location: [facility] OR;  Service: General;  Laterality: Right;    Current Medications: No outpatient prescriptions have been marked as taking for the 08/06/16 encounter (Appointment) with Beatrice Lecher, PA-C.       Allergies:   Review of patient's allergies indicates no known allergies.   Social History   Social History  . Marital status: Single    Spouse name: N/A  . Number of children: N/A  . Years of education: N/A   Social History Main Topics  . Smoking status: Never Smoker  . Smokeless tobacco: Never Used  . Alcohol use No     Comment: not had any alcohol in 6 months  . Drug use: No  . Sexual activity: Not on file   Other Topics Concern  . Not on file   Social History Narrative  . No narrative on file     Family History:  The patient's ***family history includes Diabetes in his mother.   ROS:   Please see the history of present illness.    ROS All other systems reviewed and are negative.   EKGs/Labs/Other Test Reviewed:    EKG:  EKG is *** ordered today.  The ekg ordered today demonstrates ***  Recent Labs: 06/13/2016: TSH 0.581 06/18/2016: Magnesium 1.8 06/30/2016: B Natriuretic Peptide 21.4 07/04/2016: Hemoglobin 12.4; Platelets 266 07/08/2016: ALT 10 08/05/2016: BUN 7; Creat 0.79; Potassium 3.5; Sodium 135   Recent Lipid Panel    Component Value Date/Time   CHOL 109 06/14/2016 0549   TRIG 76 06/14/2016 0549   HDL 31 (L) 06/14/2016 0549   CHOLHDL 3.5 06/14/2016 0549   VLDL 15 06/14/2016 0549   LDLCALC 63 06/14/2016 0549     Physical Exam:    VS:  There were no vitals taken for this visit.    Wt Readings from  Last 3 Encounters:  07/29/16 249 lb (112.9 kg)  07/28/16 242 lb 6.4 oz (110 kg)  07/10/16 249 lb (112.9 kg)     ***Physical Exam  ASSESSMENT:    No diagnosis found. PLAN:    In order of problems listed above:  1. Chronic combined systolic and diastolic CHF - He was dx with CHF in 7/17 during admission for a gluteal abscess with fistula that required I&D.  His hospital stay was c/b acute pulmonary embolism and was then readmitted with acute renal failure.    -  Continue ***  2. Nonischemic cardiomyopathy -  Repeat echo will be obtained after 3-6 mos of treatment.***  If EF remains < 35%, he will need evaluation with EP to determine +/- ICD implantation.    3. HTN - ***  4. Hx of pulmonary embolism - He remains on Xarelto***    Medication Adjustments/Labs and Tests Ordered: Current medicines are reviewed at length with the patient today.  Concerns regarding medicines are outlined above.  Medication changes, Labs and Tests ordered today are outlined in the Patient Instructions noted below. There are no Patient Instructions on file for this visit. Signed, Tereso NewcomerScott Weaver, PA-C  08/05/2016 9:48 PM    [facility] [address], [place], [place]  [address] Phone: [phone]; Fax: [phone]

## 2016-08-06 ENCOUNTER — Ambulatory Visit: Payer: Self-pay | Admitting: Physician Assistant

## 2016-08-11 ENCOUNTER — Ambulatory Visit: Payer: Self-pay | Attending: Family Medicine | Admitting: Family Medicine

## 2016-08-11 ENCOUNTER — Encounter: Payer: Self-pay | Admitting: Family Medicine

## 2016-08-11 VITALS — BP 99/67 | HR 73 | Temp 97.8°F | Ht 66.0 in | Wt 235.4 lb

## 2016-08-11 DIAGNOSIS — I110 Hypertensive heart disease with heart failure: Secondary | ICD-10-CM | POA: Insufficient documentation

## 2016-08-11 DIAGNOSIS — M25561 Pain in right knee: Secondary | ICD-10-CM | POA: Insufficient documentation

## 2016-08-11 DIAGNOSIS — Z86711 Personal history of pulmonary embolism: Secondary | ICD-10-CM | POA: Insufficient documentation

## 2016-08-11 DIAGNOSIS — Z79899 Other long term (current) drug therapy: Secondary | ICD-10-CM | POA: Insufficient documentation

## 2016-08-11 DIAGNOSIS — I5042 Chronic combined systolic (congestive) and diastolic (congestive) heart failure: Secondary | ICD-10-CM | POA: Insufficient documentation

## 2016-08-11 DIAGNOSIS — Z7901 Long term (current) use of anticoagulants: Secondary | ICD-10-CM | POA: Insufficient documentation

## 2016-08-11 DIAGNOSIS — L0231 Cutaneous abscess of buttock: Secondary | ICD-10-CM | POA: Insufficient documentation

## 2016-08-11 DIAGNOSIS — I10 Essential (primary) hypertension: Secondary | ICD-10-CM

## 2016-08-11 NOTE — Progress Notes (Signed)
Medication refill-lisinopril 

## 2016-08-11 NOTE — Progress Notes (Signed)
Subjective:  Patient ID: Justin Sampson, male    DOB: [birth year]  Age: 60 y.o. MRN: [id number]  CC: Hypertension; gluteal abscess; and Knee Pain (right)   HPI Justin Sampson s a 60 year old male with a history of obesity, new diagnosis of CHF with EF of 25-30%, acute pulmonary embolism (on anticoagulation with Xarelto), hypertension who comes in for follow-up visit.  Reports improvement in symptoms and is not short of breath; he uses oxygen only at night. Denies chest pains or pedal edema and has lost some weight. Last visit to cardiology was a month ago where his lisinopril was restarted due to improvement of his renal function.  He remains on anticoagulation with Xarelto which she obtains from the pharmacy on sight. Has occasional mild pain in both knees for which she takes Tylenol.  Past Medical History:  Diagnosis Date  . Acute pulmonary embolism (HCC) 06/26/2016  . Cardiomyopathy, nonischemic (HCC) 06/17/2016   a. LHC 7/17: no CAD  . Chronic combined systolic and diastolic CHF (congestive heart failure) (HCC)    A. Echo 06/12/16:  EF 35-40%, lateral and inferior HK, moderate LAE  //  B.  Echo 06/20/16:  Mild LVH, EF 25-30%, diffuse HK, grade 1 diastolic dysfunction, MAC, mild LAE  . History of acute renal failure 06/30/2016  . Hypertension   . Obesities, morbid (HCC)   . Snoring     Past Surgical History:  Procedure Laterality Date  . CARDIAC CATHETERIZATION N/A 06/16/2016   Procedure: Left Heart Cath and Coronary Angiography;  Surgeon: Corky Crafts, MD;  Location: [facility] INVASIVE CV LAB;  Service: Cardiovascular;  Laterality: N/A;  . INCISION AND DRAINAGE ABSCESS Right 06/10/2016   Procedure: INCISION AND DRAINAGE GLUTEAL ABSCESS;  Surgeon: Manus Rudd, MD;  Location: [facility] OR;  Service: General;  Laterality: Right;    No Known Allergies   Outpatient Medications Prior to Visit  Medication Sig Dispense Refill  . carvedilol (COREG) 3.125 MG tablet Take 1 tablet (3.125 mg total) by  mouth 2 (two) times daily with a meal. 180 tablet 3  . furosemide (LASIX) 80 MG tablet Take 0.5 tablets (40 mg total) by mouth daily. 90 tablet 3  . lisinopril (PRINIVIL,ZESTRIL) 2.5 MG tablet Take 1 tablet (2.5 mg total) by mouth daily. 90 tablet 3  . potassium chloride (KLOR-CON) 20 MEQ packet Take 40 mEq by mouth daily. 180 tablet 3  . rivaroxaban (XARELTO) 20 MG TABS tablet Take 1 tablet (20 mg total) by mouth daily with breakfast. Start taking on 07/12/16 90 tablet 3   No facility-administered medications prior to visit.     ROS Review of Systems  Constitutional: Negative for activity change and appetite change.  HENT: Negative for sinus pressure and sore throat.   Eyes: Negative for visual disturbance.  Respiratory: Negative for cough, chest tightness and shortness of breath.   Cardiovascular: Negative for chest pain and leg swelling.  Gastrointestinal: Negative for abdominal distention, abdominal pain, constipation and diarrhea.  Endocrine: Negative.   Genitourinary: Negative for dysuria.  Musculoskeletal: Negative for joint swelling and myalgias.  Skin: Negative for rash.  Allergic/Immunologic: Negative.   Neurological: Negative for weakness, light-headedness and numbness.  Psychiatric/Behavioral: Negative for dysphoric mood and suicidal ideas.    Objective:  BP 99/67 (BP Location: Right Arm, Patient Position: Sitting, Cuff Size: Large)   Pulse 73   Temp 97.8 F (36.6 C) (Oral)   Ht 5\' 6"  (1.676 m)   Wt 235 lb 6.4 oz (106.8 kg)   SpO2 99%  BMI 37.99 kg/m   BP/Weight 08/11/2016 07/29/2016 07/28/2016  Systolic BP 99 - 865106  Diastolic BP 67 - 70  Wt. (Lbs) 235.4 249 242.4  BMI 37.99 40.19 39.12    Filed Weights   08/11/16 1433  Weight: 235 lb 6.4 oz (106.8 kg)     Physical Exam  Constitutional: He is oriented to person, place, and time. He appears well-developed and well-nourished.  Cardiovascular: Normal rate, normal heart sounds and intact distal pulses.   No  murmur heard. Pulmonary/Chest: Effort normal and breath sounds normal. He has no wheezes. He has no rales. He exhibits no tenderness.  Abdominal: Soft. Bowel sounds are normal. He exhibits no distension and no mass. There is no tenderness.  Musculoskeletal: He exhibits tenderness (mild tenderness and crepitus on exam of both knees).  Neurological: He is alert and oriented to person, place, and time.    BMP Latest Ref Rng & Units 08/05/2016 07/24/2016 07/17/2016  Glucose 65 - 99 mg/dL 93 96 784(O108(H)  BUN 7 - 25 mg/dL 7 7 10   Creatinine 0.70 - 1.33 mg/dL 9.620.79 9.520.89 8.411.09  Sodium 135 - 146 mmol/L 135 134(L) 131(L)  Potassium 3.5 - 5.3 mmol/L 3.5 3.9 2.9(L)  Chloride 98 - 110 mmol/L 102 99 94(L)  CO2 20 - 31 mmol/L 21 22 20   Calcium 8.6 - 10.3 mg/dL 9.8 9.5 9.3     Assessment & Plan:   1. Chronic combined systolic and diastolic CHF (congestive heart failure) (HCC) EF 25-30% Euvolemic Continue Lasix, beta blocker and ACE inhibitor. Keep appointment with cardiology Restrict daily fluids to less than 2 L, low-sodium diet  2. Essential hypertension Blood pressure is on the soft side and I have educated him about signs and symptoms of hypotension Lisinopril was recently restarted by cardiology and I have informed him to notify them in the event that he is hypertensive  3. History of pulmonary embolism Currently on anticoagulation with Xarelto which he will continue until 12/2016   No orders of the defined types were placed in this encounter.   Follow-up: Return in about 3 months (around 11/10/2016) for follow up on CHF.   Jaclyn ShaggyEnobong Amao MD

## 2016-08-11 NOTE — Patient Instructions (Signed)

## 2016-08-16 DIAGNOSIS — G4730 Sleep apnea, unspecified: Secondary | ICD-10-CM

## 2016-08-16 NOTE — Procedures (Signed)
Patient Name: Justin Sampson, Justin Sampson Date: 07/29/2016 Gender: Male D.O.B: 12/07/1955 Age (years): 60 Referring Provider: Barton Dubois Height (inches): 60 Interpreting Physician: Baird Lyons MD, ABSM Weight (lbs): 249 RPSGT: Zadie Rhine BMI: 40 MRN: [id number] Neck Size: 19.00 CLINICAL INFORMATION Sleep Study Type: Split Night CPAP   Indication for sleep study: OSA   Epworth Sleepiness Score: 10/24 SLEEP STUDY TECHNIQUE As per the AASM Manual for the Scoring of Sleep and Associated Events v2.3 (April 2016) with a hypopnea requiring 4% desaturations. The channels recorded and monitored were frontal, central and occipital EEG, electrooculogram (EOG), submentalis EMG (chin), nasal and oral airflow, thoracic and abdominal wall motion, anterior tibialis EMG, snore microphone, electrocardiogram, and pulse oximetry. Continuous positive airway pressure (CPAP) was initiated when the patient met split night criteria and was titrated according to treat sleep-disordered breathing.  MEDICATIONS Medications taken by the patient : charted for review Medications administered by patient during sleep study : No sleep medicine administered.  RESPIRATORY PARAMETERS Diagnostic Total AHI (/hr): 76.0 RDI (/hr): 80.8 OA Index (/hr): 49.4 CA Index (/hr): 0.0 REM AHI (/hr): 55.0 NREM AHI (/hr): 78.2 Supine AHI (/hr): 75.0 Non-supine AHI (/hr): 76.58 Min O2 Sat (%): 58.00 Mean O2 (%): 87.81 Time below 88% (min): 63.3   Titration Optimal Pressure (cm): 18 AHI at Optimal Pressure (/hr): 0.0 Min O2 at Optimal Pressure (%): 92.0 Supine % at Optimal (%): 0 Sleep % at Optimal (%): 91    SLEEP ARCHITECTURE The recording time for the entire night was 388.0 minutes. During a baseline period of 148.4 minutes, the patient slept for 124.0 minutes in REM and nonREM, yielding a sleep efficiency of 83.5%. Sleep onset after lights out was 0.1 minutes with a REM latency of 98.5 minutes. The patient spent 8.06% of the  night in stage N1 sleep, 81.85% in stage N2 sleep, 0.40% in stage N3 and 9.68% in REM. During the titration period of 236.7 minutes, the patient slept for 196.0 minutes in REM and nonREM, yielding a sleep efficiency of 82.8%. Sleep onset after CPAP initiation was 0.3 minutes with a REM latency of 39.0 minutes. The patient spent 5.61% of the night in stage N1 sleep, 51.79% in stage N2 sleep, 0.26% in stage N3 and 42.35% in REM.  CARDIAC DATA The 2 lead EKG demonstrated sinus rhythm. The mean heart rate was 67.89 beats per minute. Other EKG findings include: PVCs.  LEG MOVEMENT DATA The total Periodic Limb Movements of Sleep (PLMS) were 0. The PLMS index was 0.00 .  IMPRESSIONS - Severe obstructive sleep apnea occurred during the diagnostic portion of the study (AHI = 76.0/hour). An optimal PAP pressure was selected for this patient ( 18 cm of water) - No significant central sleep apnea occurred during the diagnostic portion of the study (CAI = 0.0/hour). - Severe oxygen desaturation was noted during the diagnostic portion of the study (Min O2 = 58.00%). - No snoring was audible during the diagnostic portion of the study. - EKG findings include PVCs. - Clinically significant periodic limb movements did not occur during sleep.  DIAGNOSIS - Obstructive Sleep Apnea (327.23 [G47.33 ICD-10])  RECOMMENDATIONS - Trial of CPAP therapy on 18 cm H2O with a Large size Resmed Full Face Mask AirFit F20 mask and heated humidification. - Avoid alcohol, sedatives and other CNS depressants that may worsen sleep apnea and disrupt normal sleep architecture. - Sleep hygiene should be reviewed to assess factors that may improve sleep quality. - Weight management and regular exercise should be initiated or  continued.  [Electronically signed] 08/16/2016 11:03 AM  Baird Lyons MD, ABSM Diplomate, American Board of Sleep Medicine   NPI: [id number] [place], American Board of Sleep  Medicine  ELECTRONICALLY SIGNED ON:  08/16/2016, 11:01 AM [place] PH: [phone]   FX: [phone] [place]

## 2016-08-20 MED FILL — LISINOPRIL 2.5 MG TABLET: 2.5 | 30 days supply | Qty: 30 | Fill #1

## 2016-08-22 ENCOUNTER — Telehealth: Payer: Self-pay | Admitting: Family Medicine

## 2016-08-22 NOTE — Telephone Encounter (Signed)
Shanda Bumps from [facility] called to speak with nurse regarding patient. Justin Sampson was discharged and his wound has healed. Shanda Bumps thought he had scheduled a follow up appt with PCP for 9/29 but I informed her that he had scheduled to see the financial counselor. She will inform pt to call us on Monday 9/25 to schedule follow up.    Thank you.

## 2016-08-28 NOTE — Progress Notes (Signed)
Cardiology Office Note:    Date:  08/29/2016   ID:  Justin Sampson, DOB 01-29-1956, MRN [id number]  PCP:  Jaclyn Shaggy, MD  Cardiologist:  Dr. Dietrich Pates   Electrophysiologist:  n/a  Referring MD: Jaclyn Shaggy, MD   Chief Complaint  Patient presents with  . Follow-up    CHF    History of Present Illness:    Justin Sampson is a 60 y.o. male with a hx of combined systolic and diastolic CHF secondary to NICM, HTN, pulmonary embolism.  He was hospitalized in 7/17 with a right gluteal abscess requiring I&D c/b ARF,acute systolic/diastolic CHF, acute R pulmonary embolism. Echo demonstrated EF 25-30 %, mild diastolic dysfunction. LHC demonstrated no significant CAD.  He is on Xarelto for anticoagulation. Of note, he was suspected to have OSA and outpatient sleep study was recommended.   He was re-admitted in 8/17 with acute renal failure (SCr 7.16 on admission). His ACE inhibitor was stopped.   Last seen by me 07/28/16.  Here today with his daughter for FU.  She helps with interpretation.  The patient is doing well.  He has a lot of knee pain on the R likely from DJD.  He denies chest pain, syncope, significant dyspnea, orthopnea, PND, edema. 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  He is now on CPAP for OSA.    Prior CV studies that were reviewed today include:    Echo 06/20/16 Mild LVH, EF 25-30%, diffuse HK, grade 1 diastolic dysfunction, MAC, mild LAE  LHC 06/16/16 There is moderate to severe left ventricular systolic dysfunction. No signficant CAD. Mildly elevated LVEDP. Nonischemic cardiomyopathy. Continue medical therapy. I also recommended weight loss to him.   Echo 06/12/16 EF 35-40%, lateral and inferior HK, moderate LAE   Past Medical History:  Diagnosis Date  . Acute pulmonary embolism (HCC) 06/26/2016  . Cardiomyopathy, nonischemic (HCC) 06/17/2016   a. LHC 7/17: no CAD  . Chronic combined systolic and diastolic CHF (congestive heart failure) (HCC)    A. Echo 06/12/16:   EF 35-40%, lateral and inferior HK, moderate LAE  //  B.  Echo 06/20/16:  Mild LVH, EF 25-30%, diffuse HK, grade 1 diastolic dysfunction, MAC, mild LAE  . History of acute renal failure 06/30/2016  . Hypertension   . Obesities, morbid (HCC)   . OSA (obstructive sleep apnea)    on CPAP    Past Surgical History:  Procedure Laterality Date  . CARDIAC CATHETERIZATION N/A 06/16/2016   Procedure: Left Heart Cath and Coronary Angiography;  Surgeon: Corky Crafts, MD;  Location: [facility] INVASIVE CV LAB;  Service: Cardiovascular;  Laterality: N/A;  . INCISION AND DRAINAGE ABSCESS Right 06/10/2016   Procedure: INCISION AND DRAINAGE GLUTEAL ABSCESS;  Surgeon: Manus Rudd, MD;  Location: [facility] OR;  Service: General;  Laterality: Right;    Current Medications: Current Meds  Medication Sig  . carvedilol (COREG) 3.125 MG tablet Take 1 tablet (3.125 mg total) by mouth 2 (two) times daily with a meal.  . furosemide (LASIX) 80 MG tablet Take 0.5 tablets (40 mg total) by mouth daily.  Marland Kitchen lisinopril (PRINIVIL,ZESTRIL) 2.5 MG tablet Take 1 tablet (2.5 mg total) by mouth daily.  . potassium chloride (KLOR-CON) 20 MEQ packet Take 40 mEq by mouth daily.  . rivaroxaban (XARELTO) 20 MG TABS tablet Take 1 tablet (20 mg total) by mouth daily with breakfast. Start taking on 07/12/16       Allergies:   Review of patient's allergies indicates no known allergies.  Social History   Social History  . Marital status: Single    Spouse name: N/A  . Number of children: N/A  . Years of education: N/A   Social History Main Topics  . Smoking status: Never Smoker  . Smokeless tobacco: Never Used  . Alcohol use No     Comment: not had any alcohol in 6 months  . Drug use: No  . Sexual activity: Not Asked   Other Topics Concern  . None   Social History Narrative  . None     Family History:  The patient's family history includes Diabetes in his mother.   ROS:   Please see the history of present illness.    ROS  All other systems reviewed and are negative.   EKGs/Labs/Other Test Reviewed:    EKG:  EKG is  ordered today.  The ekg ordered today demonstrates NSR, HR 76, normal axis, NSSTTW changes, septal Q waves, PVC, QTc 459 ms, no changes.   Recent Labs: 06/13/2016: TSH 0.581 06/18/2016: Magnesium 1.8 06/30/2016: B Natriuretic Peptide 21.4 07/04/2016: Hemoglobin 12.4; Platelets 266 07/08/2016: ALT 10 08/05/2016: BUN 7; Creat 0.79; Potassium 3.5; Sodium 135   Recent Lipid Panel    Component Value Date/Time   CHOL 109 06/14/2016 0549   TRIG 76 06/14/2016 0549   HDL 31 (L) 06/14/2016 0549   CHOLHDL 3.5 06/14/2016 0549   VLDL 15 06/14/2016 0549   LDLCALC 63 06/14/2016 0549     Physical Exam:    VS:  BP 128/78   Pulse 76   Ht 5\' 6"  (1.676 m)   Wt 237 lb (107.5 kg)   BMI 38.25 kg/m     Wt Readings from Last 3 Encounters:  08/29/16 237 lb (107.5 kg)  08/11/16 235 lb 6.4 oz (106.8 kg)  07/29/16 249 lb (112.9 kg)     Physical Exam  Constitutional: He is oriented to person, place, and time. He appears well-developed and well-nourished. No distress.  HENT:  Head: Normocephalic and atraumatic.  Eyes: Right conjunctiva is injected. Left conjunctiva is injected.  Neck: No JVD present.  Cardiovascular: Normal rate, regular rhythm and normal heart sounds.   No murmur heard. Pulmonary/Chest: Effort normal and breath sounds normal. He has no wheezes. He has no rales.  Abdominal: Soft. There is no tenderness.  Musculoskeletal: He exhibits no edema.  Neurological: He is alert and oriented to person, place, and time.  Skin: Skin is warm and dry.  Psychiatric: He has a normal mood and affect.    ASSESSMENT:    1. Chronic combined systolic and diastolic CHF (congestive heart failure) (HCC)   2. Cardiomyopathy, nonischemic (HCC)   3. Essential hypertension   4. History of pulmonary embolism    PLAN:    In order of problems listed above:  1. Chronic combined systolic and diastolic CHF -  Volume is stable.  -  Continue current dose of Lasix, Coreg, Lisinopril.  -  BMET today   2. Nonischemic cardiomyopathy -  Arrange Limited Echo last week of Oct or after to recheck EF. If EF < 35%, refer to EP for +/- ICD.     3. HTN - BP controlled.   4. Hx of pulmonary embolism - He remains on Xarelto.  PE was in the setting of hospitalization for abscess I&D and CHF.  FU with PCP.      Medication Adjustments/Labs and Tests Ordered: Current medicines are reviewed at length with the patient today.  Concerns regarding medicines are outlined  above.  Medication changes, Labs and Tests ordered today are outlined in the Patient Instructions noted below. Patient Instructions  Medication Instructions:  1. Your physician recommends that you continue on your current medications as directed. Please refer to the Current Medication list given to you today.  Labwork: TODAY BMET  Testing/Procedures: Your physician has requested that you have an LIMITED echocardiogram. Echocardiography is a painless test that uses sound waves to create images of your heart. It provides your doctor with information about the size and shape of your heart and how well your heart's chambers and valves are working. This procedure takes approximately one hour. There are no restrictions for this procedure. DX DILATED CARDIOMYOPATHY; THIS IS TO BE DONE THE LAST WEEK OF October OR AFTER. RECHECK EF  Follow-Up: DR. Tenny Craw IN 3 MONTHS OR Margot Oriordan, PAC IN 3 MONTHS SAME DAY DR. Tenny Craw IS IN THE OFFICE  Any Other Special Instructions Will Be Listed Below (If Applicable). If you need a refill on your cardiac medications before your next appointment, please call your pharmacy.  Signed, Tereso Newcomer, PA-C  08/29/2016 10:34 AM    [facility] [address] [place], [place], [place]  [address] Phone: [phone]; Fax: [phone]

## 2016-08-29 ENCOUNTER — Encounter: Payer: Self-pay | Admitting: Physician Assistant

## 2016-08-29 ENCOUNTER — Telehealth: Payer: Self-pay | Admitting: *Deleted

## 2016-08-29 ENCOUNTER — Ambulatory Visit: Payer: Self-pay | Attending: Internal Medicine

## 2016-08-29 ENCOUNTER — Ambulatory Visit (INDEPENDENT_AMBULATORY_CARE_PROVIDER_SITE_OTHER): Payer: Self-pay | Admitting: Physician Assistant

## 2016-08-29 VITALS — BP 128/78 | HR 76 | Ht 66.0 in | Wt 237.0 lb

## 2016-08-29 DIAGNOSIS — Z86711 Personal history of pulmonary embolism: Secondary | ICD-10-CM

## 2016-08-29 DIAGNOSIS — I5042 Chronic combined systolic (congestive) and diastolic (congestive) heart failure: Secondary | ICD-10-CM

## 2016-08-29 DIAGNOSIS — I10 Essential (primary) hypertension: Secondary | ICD-10-CM

## 2016-08-29 DIAGNOSIS — I428 Other cardiomyopathies: Secondary | ICD-10-CM

## 2016-08-29 DIAGNOSIS — I429 Cardiomyopathy, unspecified: Secondary | ICD-10-CM

## 2016-08-29 LAB — BASIC METABOLIC PANEL
BUN: 9 mg/dL (ref 7–25)
CHLORIDE: 103 mmol/L (ref 98–110)
CO2: 22 mmol/L (ref 20–31)
Calcium: 9.5 mg/dL (ref 8.6–10.3)
Creat: 0.71 mg/dL (ref 0.70–1.33)
Glucose, Bld: 94 mg/dL (ref 65–99)
POTASSIUM: 3.8 mmol/L (ref 3.5–5.3)
SODIUM: 137 mmol/L (ref 135–146)

## 2016-08-29 NOTE — Telephone Encounter (Signed)
DPR on file for pt's friend who has been notified of lab results by phone with verbal understanding. Pt relayed to pt the results while on the phone.

## 2016-08-29 NOTE — Patient Instructions (Addendum)
Medication Instructions:  1. Your physician recommends that you continue on your current medications as directed. Please refer to the Current Medication list given to you today.  Labwork: TODAY BMET  Testing/Procedures: Your physician has requested that you have an LIMITED echocardiogram. Echocardiography is a painless test that uses sound waves to create images of your heart. It provides your doctor with information about the size and shape of your heart and how well your heart's chambers and valves are working. This procedure takes approximately one hour. There are no restrictions for this procedure. DX DILATED CARDIOMYOPATHY; THIS IS TO BE DONE THE LAST WEEK OF October OR AFTER. RECHECK EF  Follow-Up: DR. Tenny Craw IN 3 MONTHS OR [facility], PAC IN 3 MONTHS SAME DAY DR. Tenny Craw IS IN THE OFFICE  Any Other Special Instructions Will Be Listed Below (If Applicable). If you need a refill on your cardiac medications before your next appointment, please call your pharmacy.

## 2016-09-03 MED FILL — XARELTO 20 MG TABLET: 20 | 30 days supply | Qty: 30 | Fill #2

## 2016-09-03 MED FILL — CARVEDILOL 3.125 MG TABLET: 3.125 | 30 days supply | Qty: 60 | Fill #1

## 2016-09-25 MED FILL — POTASSIUM CL ER 20 MEQ TAB: 20 | 30 days supply | Qty: 60 | Fill #1

## 2016-09-29 ENCOUNTER — Encounter: Payer: Self-pay | Admitting: Physician Assistant

## 2016-09-29 ENCOUNTER — Other Ambulatory Visit: Payer: Self-pay

## 2016-09-29 ENCOUNTER — Ambulatory Visit (HOSPITAL_COMMUNITY): Payer: Self-pay | Attending: Cardiology

## 2016-09-29 DIAGNOSIS — E669 Obesity, unspecified: Secondary | ICD-10-CM | POA: Insufficient documentation

## 2016-09-29 DIAGNOSIS — Z6838 Body mass index (BMI) 38.0-38.9, adult: Secondary | ICD-10-CM | POA: Insufficient documentation

## 2016-09-29 DIAGNOSIS — I428 Other cardiomyopathies: Secondary | ICD-10-CM

## 2016-09-29 DIAGNOSIS — I509 Heart failure, unspecified: Secondary | ICD-10-CM | POA: Insufficient documentation

## 2016-09-30 ENCOUNTER — Telehealth: Payer: Self-pay | Admitting: *Deleted

## 2016-09-30 DIAGNOSIS — I2699 Other pulmonary embolism without acute cor pulmonale: Secondary | ICD-10-CM

## 2016-09-30 DIAGNOSIS — I5042 Chronic combined systolic (congestive) and diastolic (congestive) heart failure: Secondary | ICD-10-CM

## 2016-09-30 DIAGNOSIS — I10 Essential (primary) hypertension: Secondary | ICD-10-CM

## 2016-09-30 DIAGNOSIS — N289 Disorder of kidney and ureter, unspecified: Secondary | ICD-10-CM

## 2016-09-30 NOTE — Telephone Encounter (Signed)
Lmtcb to go over Limited Echo results and medication changes.

## 2016-10-01 MED ORDER — SPIRONOLACTONE 25 MG PO TABS: 12.5000 mg | ORAL_TABLET | Freq: Every day | ORAL | 3 refills | Status: AC

## 2016-10-01 MED ORDER — RIVAROXABAN 20 MG PO TABS: 20.0000 mg | ORAL_TABLET | Freq: Every day | ORAL | 3 refills | Status: AC

## 2016-10-01 MED ORDER — POTASSIUM CHLORIDE CRYS ER 20 MEQ PO TBCR: 20.0000 meq | EXTENDED_RELEASE_TABLET | Freq: Every day | ORAL | 3 refills | Status: AC

## 2016-10-01 NOTE — Telephone Encounter (Signed)
I s/w DPR Wendie Agreste who does not speak or understand English well. She states to me pt is telling her to have me call his other friend Royce Macadamia @ [phone] and Aniceto Boss will relay the results and recommendations.  I then lmtcb for Royce Macadamia [phone] per pt request to s/w her about results and changes and Aniceto Boss will give him the results.   I then called [employer] : S/w Angel # [phone]; Lawanna Kobus interpreted for me the results and med changes for the pt. Pt is agreeable to start Spironolactone 12.5 mg daily and to decrease K+ to 20 meq daily. Pt asked if I would send in a refill for his Xarelto as well. Pt agreeable to bmet to be done 11/7 and 11/14 due to start of Spironolactone . Pt verbalized understanding to all instructions and changes by phone through the use of [employer] # [phone].

## 2016-10-07 ENCOUNTER — Other Ambulatory Visit: Payer: Self-pay | Admitting: *Deleted

## 2016-10-07 DIAGNOSIS — I5042 Chronic combined systolic (congestive) and diastolic (congestive) heart failure: Secondary | ICD-10-CM

## 2016-10-07 DIAGNOSIS — N289 Disorder of kidney and ureter, unspecified: Secondary | ICD-10-CM

## 2016-10-07 LAB — BASIC METABOLIC PANEL
BUN: 10 mg/dL (ref 7–25)
CALCIUM: 9.2 mg/dL (ref 8.6–10.3)
CO2: 23 mmol/L (ref 20–31)
CREATININE: 0.72 mg/dL (ref 0.70–1.33)
Chloride: 105 mmol/L (ref 98–110)
GLUCOSE: 85 mg/dL (ref 65–99)
Potassium: 4.2 mmol/L (ref 3.5–5.3)
SODIUM: 137 mmol/L (ref 135–146)

## 2016-10-07 MED FILL — LISINOPRIL 2.5 MG TABLET: 2.5 | 30 days supply | Qty: 30 | Fill #2

## 2016-10-07 MED FILL — XARELTO 20 MG TABLET: 20 | 30 days supply | Qty: 30 | Fill #3

## 2016-10-14 ENCOUNTER — Telehealth: Payer: Self-pay | Admitting: *Deleted

## 2016-10-14 ENCOUNTER — Other Ambulatory Visit: Payer: No Typology Code available for payment source | Admitting: *Deleted

## 2016-10-14 DIAGNOSIS — I5042 Chronic combined systolic (congestive) and diastolic (congestive) heart failure: Secondary | ICD-10-CM

## 2016-10-14 DIAGNOSIS — N289 Disorder of kidney and ureter, unspecified: Secondary | ICD-10-CM

## 2016-10-14 LAB — BASIC METABOLIC PANEL
BUN: 12 mg/dL (ref 7–25)
CALCIUM: 9.2 mg/dL (ref 8.6–10.3)
CO2: 21 mmol/L (ref 20–31)
Chloride: 107 mmol/L (ref 98–110)
Creat: 0.85 mg/dL (ref 0.70–1.33)
GLUCOSE: 88 mg/dL (ref 65–99)
POTASSIUM: 3.5 mmol/L (ref 3.5–5.3)
SODIUM: 141 mmol/L (ref 135–146)

## 2016-10-14 NOTE — Telephone Encounter (Signed)
Pt notified of lab results through [facility] # [phone]. Pt verbalized understanding to his lab results tonight.

## 2016-11-04 MED FILL — XARELTO 20 MG TABLET: 20 | 30 days supply | Qty: 30 | Fill #4

## 2016-11-17 ENCOUNTER — Ambulatory Visit: Payer: Self-pay | Admitting: Internal Medicine

## 2018-06-29 IMAGING — US US RENAL
1 series · 14 of 24 positions shown · non-contrast
Comparison: None; correlation CT abdomen and pelvis 06/10/2016

CLINICAL DATA: Renal failure, hypertension, non ischemic
cardiomyopathy, obesity

EXAM:
RENAL / URINARY TRACT ULTRASOUND COMPLETE

[Series 1: us renal · 0.22mm/px · 14 of 24 slices shown]
[im 1/24]
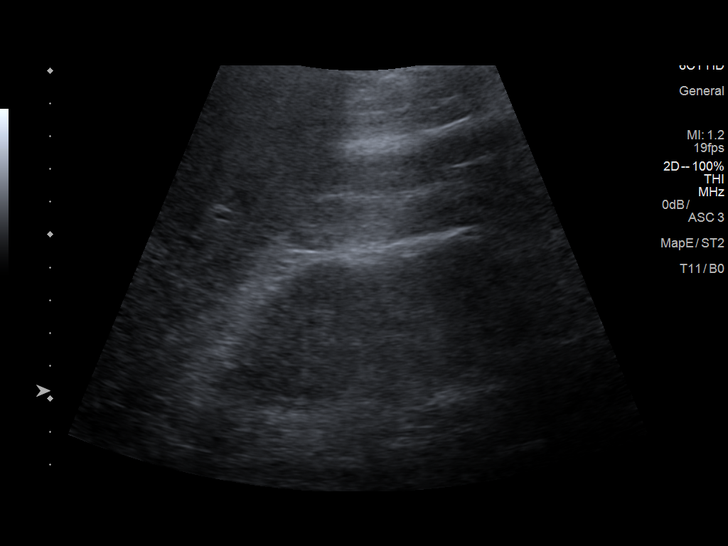
[im 3/24]
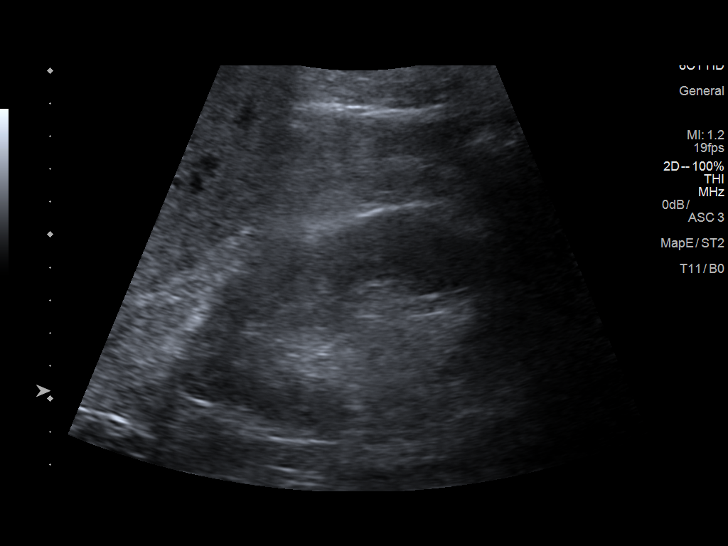
[im 5/24]
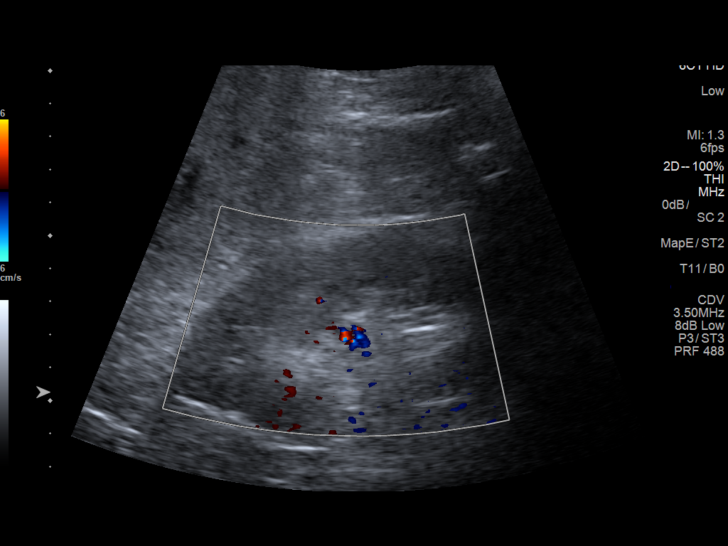
[im 7/24]
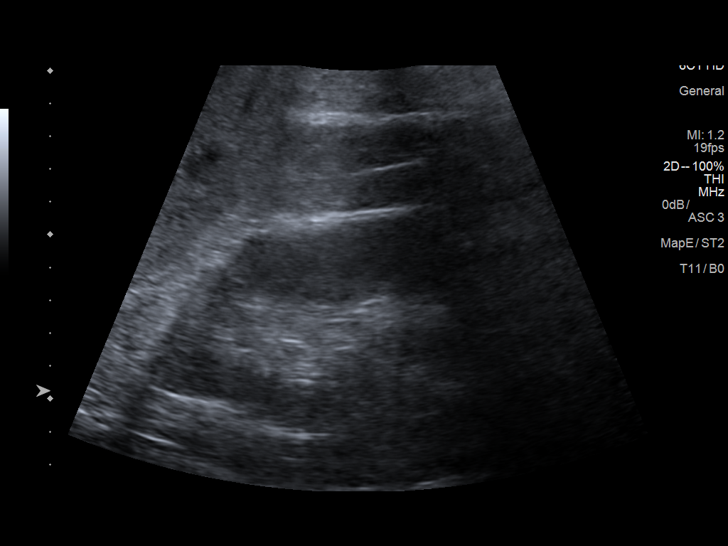
[im 8/24]
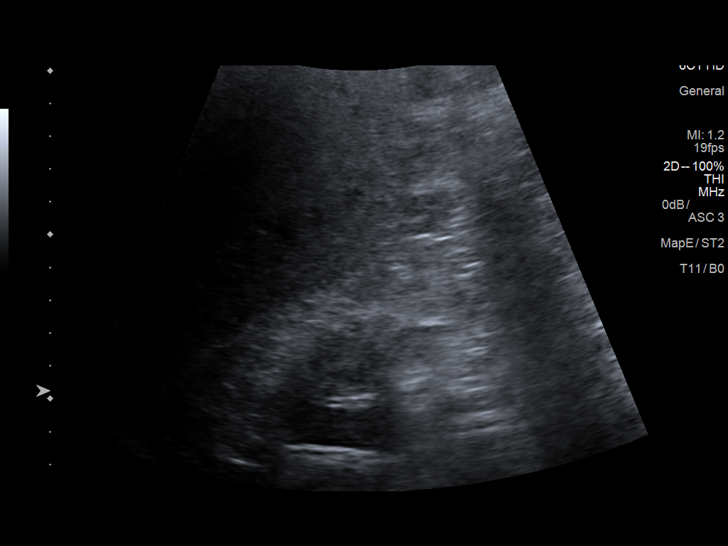
[im 10/24]
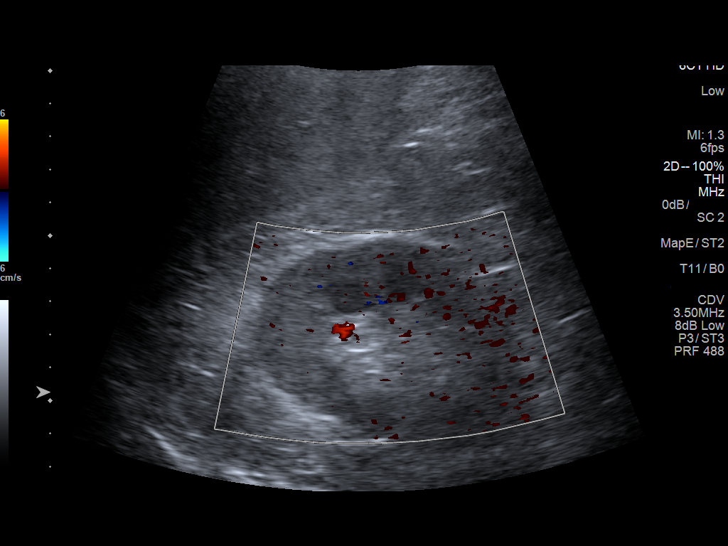
[im 12/24]
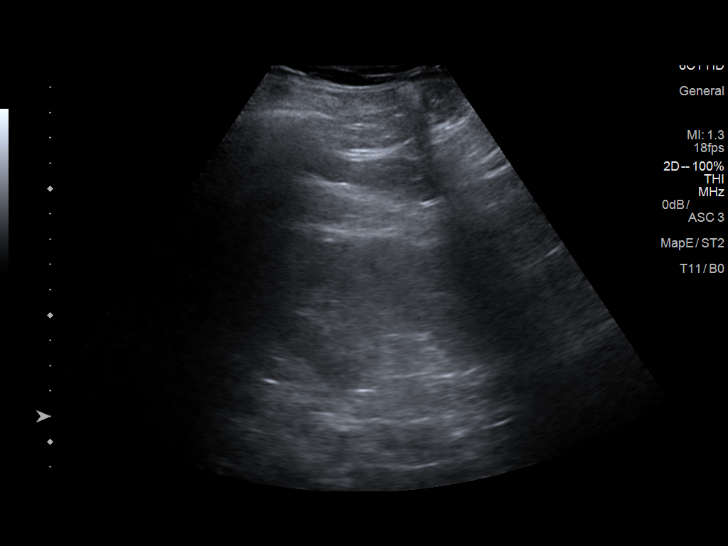
[im 13/24]
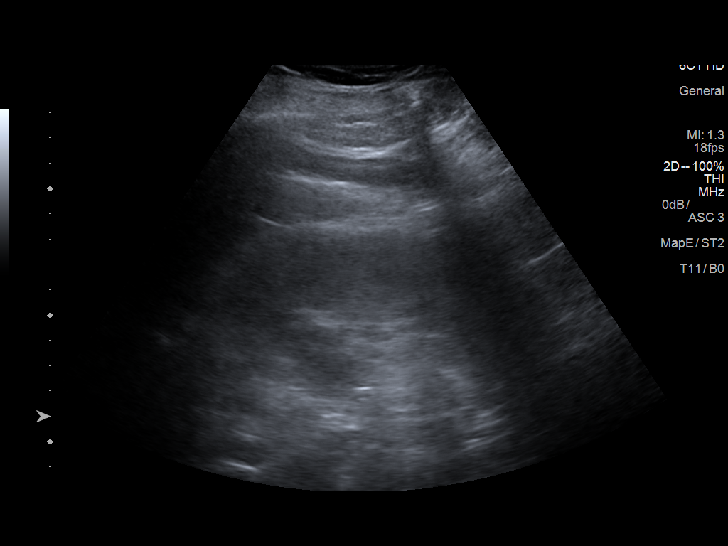
[im 15/24]
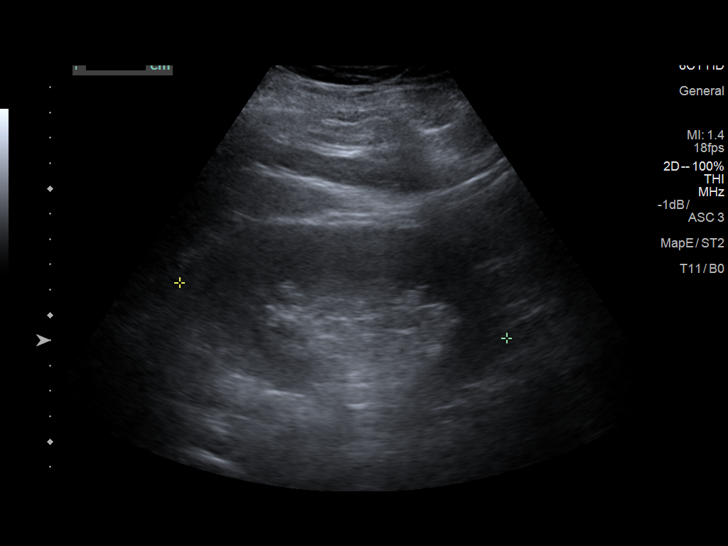
[im 17/24]
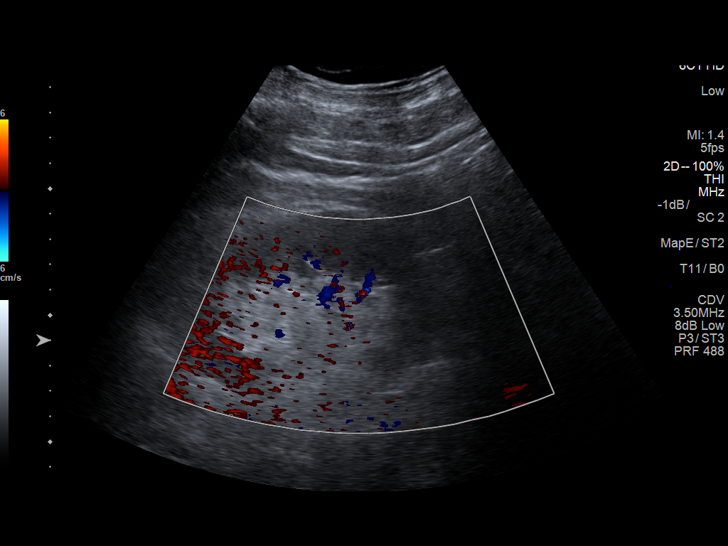
[im 19/24]
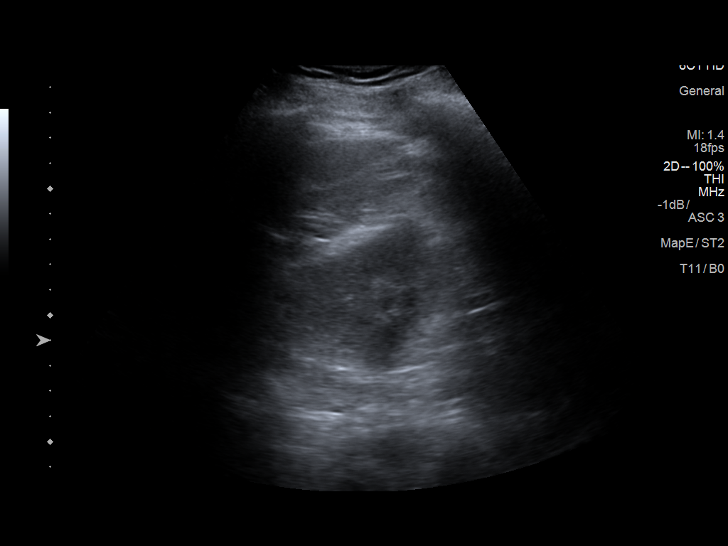
[im 20/24]
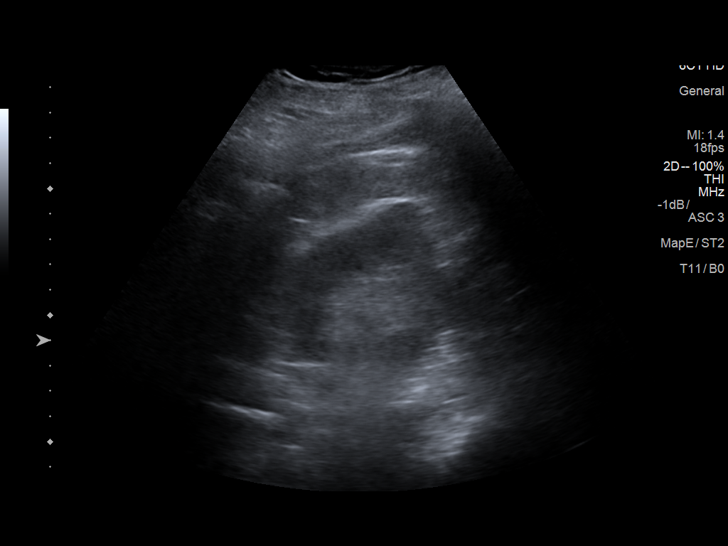
[im 22/24]
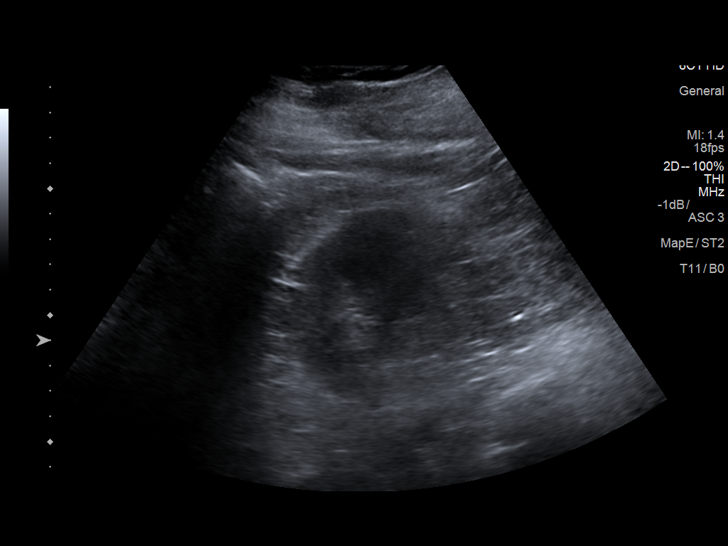
[im 24/24]
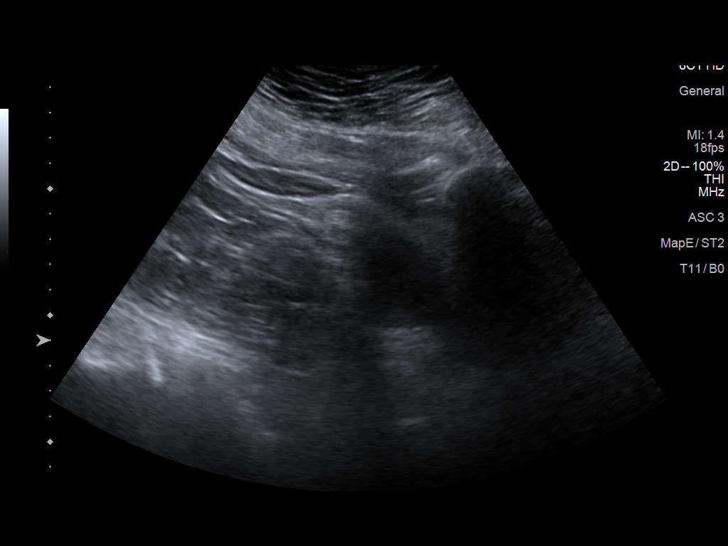

[14 of 24 positions shown; findings below may reference images not displayed]

FINDINGS: Right Kidney:

Length: 11.8 cm. Normal cortical thickness. Upper normal cortical
echogenicity. No mass, hydronephrosis or shadowing calcification.

Left Kidney:

Length: 13.1 cm. Normal cortical thickness. Upper normal cortical
echogenicity. No mass, hydronephrosis or shadowing calcification.

Bladder:

Decompressed, unable to adequately assess
IMPRESSION: No definite sonographic urinary tract abnormalities identified.
